# Patient Record
Sex: Female | Born: 1940
Health system: Southern US, Community
[De-identification: ages and names within clinical notes are randomized; demographics above are authoritative.]

## PROBLEM LIST (undated history)

## (undated) DIAGNOSIS — D259 Leiomyoma of uterus, unspecified: Secondary | ICD-10-CM

## (undated) DIAGNOSIS — M1990 Unspecified osteoarthritis, unspecified site: Secondary | ICD-10-CM

## (undated) DIAGNOSIS — I2510 Atherosclerotic heart disease of native coronary artery without angina pectoris: Secondary | ICD-10-CM

## (undated) DIAGNOSIS — D689 Coagulation defect, unspecified: Secondary | ICD-10-CM

## (undated) DIAGNOSIS — H269 Unspecified cataract: Secondary | ICD-10-CM

## (undated) DIAGNOSIS — I10 Essential (primary) hypertension: Secondary | ICD-10-CM

## (undated) HISTORY — PX: CATARACT EXTRACTION, BILATERAL: SHX1313

## (undated) HISTORY — PX: TUBAL LIGATION: SHX77

## (undated) HISTORY — PX: CHOLECYSTECTOMY: SHX55

## (undated) HISTORY — DX: Leiomyoma of uterus, unspecified: D25.9

## (undated) HISTORY — DX: Coagulation defect, unspecified: D68.9

## (undated) HISTORY — DX: Unspecified cataract: H26.9

## (undated) HISTORY — PX: EYE SURGERY: SHX253

## (undated) HISTORY — DX: Unspecified osteoarthritis, unspecified site: M19.90

---

## 2004-07-30 ENCOUNTER — Ambulatory Visit (HOSPITAL_COMMUNITY): Admission: RE | Admit: 2004-07-30 | Discharge: 2004-07-30 | Payer: Self-pay | Admitting: Pulmonary Disease

## 2004-09-27 ENCOUNTER — Observation Stay (HOSPITAL_COMMUNITY): Admission: RE | Admit: 2004-09-27 | Discharge: 2004-09-28 | Payer: Self-pay | Admitting: General Surgery

## 2006-05-02 ENCOUNTER — Ambulatory Visit (HOSPITAL_COMMUNITY): Admission: RE | Admit: 2006-05-02 | Discharge: 2006-05-02 | Payer: Self-pay | Admitting: Pulmonary Disease

## 2010-04-18 ENCOUNTER — Encounter: Payer: Self-pay | Admitting: Pulmonary Disease

## 2010-07-29 ENCOUNTER — Other Ambulatory Visit (HOSPITAL_COMMUNITY): Payer: Self-pay | Admitting: Pulmonary Disease

## 2010-07-29 ENCOUNTER — Ambulatory Visit (HOSPITAL_COMMUNITY)
Admission: RE | Admit: 2010-07-29 | Discharge: 2010-07-29 | Disposition: A | Discharge status: Home / Self Care | Payer: Medicare Other | Source: Ambulatory Visit | Attending: Pulmonary Disease | Admitting: Pulmonary Disease

## 2010-07-29 DIAGNOSIS — R52 Pain, unspecified: Secondary | ICD-10-CM

## 2010-07-29 DIAGNOSIS — M25519 Pain in unspecified shoulder: Secondary | ICD-10-CM | POA: Insufficient documentation

## 2010-07-29 DIAGNOSIS — M25569 Pain in unspecified knee: Secondary | ICD-10-CM | POA: Insufficient documentation

## 2010-07-29 DIAGNOSIS — M7730 Calcaneal spur, unspecified foot: Secondary | ICD-10-CM | POA: Insufficient documentation

## 2010-07-29 DIAGNOSIS — M25579 Pain in unspecified ankle and joints of unspecified foot: Secondary | ICD-10-CM | POA: Insufficient documentation

## 2013-06-24 ENCOUNTER — Encounter (HOSPITAL_COMMUNITY): Payer: Self-pay | Admitting: Emergency Medicine

## 2013-06-24 ENCOUNTER — Emergency Department (HOSPITAL_COMMUNITY)
Admission: EM | Admit: 2013-06-24 | Discharge: 2013-06-24 | Disposition: A | Discharge status: Home / Self Care | Payer: No Typology Code available for payment source | Attending: Emergency Medicine | Admitting: Emergency Medicine

## 2013-06-24 DIAGNOSIS — I10 Essential (primary) hypertension: Secondary | ICD-10-CM | POA: Diagnosis not present

## 2013-06-24 DIAGNOSIS — Z79899 Other long term (current) drug therapy: Secondary | ICD-10-CM | POA: Diagnosis not present

## 2013-06-24 DIAGNOSIS — Y9389 Activity, other specified: Secondary | ICD-10-CM | POA: Diagnosis not present

## 2013-06-24 DIAGNOSIS — Z043 Encounter for examination and observation following other accident: Secondary | ICD-10-CM | POA: Insufficient documentation

## 2013-06-24 DIAGNOSIS — Z7982 Long term (current) use of aspirin: Secondary | ICD-10-CM | POA: Diagnosis not present

## 2013-06-24 DIAGNOSIS — Y92410 Unspecified street and highway as the place of occurrence of the external cause: Secondary | ICD-10-CM | POA: Diagnosis not present

## 2013-06-24 HISTORY — DX: Essential (primary) hypertension: I10
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
## 2013-10-08 ENCOUNTER — Other Ambulatory Visit: Payer: Self-pay | Admitting: Nurse Practitioner

## 2013-10-08 ENCOUNTER — Other Ambulatory Visit: Payer: Self-pay | Admitting: Internal Medicine

## 2013-10-08 DIAGNOSIS — I10 Essential (primary) hypertension: Secondary | ICD-10-CM

## 2013-10-09 ENCOUNTER — Ambulatory Visit
Admission: RE | Admit: 2013-10-09 | Discharge: 2013-10-09 | Disposition: A | Discharge status: Home / Self Care | Payer: Medicare Other | Source: Ambulatory Visit | Attending: Nurse Practitioner | Admitting: Nurse Practitioner

## 2013-10-09 DIAGNOSIS — I10 Essential (primary) hypertension: Secondary | ICD-10-CM

## 2014-12-18 ENCOUNTER — Encounter: Payer: Self-pay | Admitting: Obstetrics

## 2014-12-18 ENCOUNTER — Ambulatory Visit (INDEPENDENT_AMBULATORY_CARE_PROVIDER_SITE_OTHER): Payer: Medicare Other | Admitting: Obstetrics

## 2014-12-18 ENCOUNTER — Other Ambulatory Visit: Payer: Self-pay | Admitting: Obstetrics

## 2014-12-18 ENCOUNTER — Telehealth: Payer: Self-pay

## 2014-12-18 VITALS — BP 162/83 | HR 73 | Temp 97.9°F | Ht 63.0 in | Wt 189.0 lb

## 2014-12-18 DIAGNOSIS — N760 Acute vaginitis: Secondary | ICD-10-CM

## 2014-12-18 DIAGNOSIS — Z01419 Encounter for gynecological examination (general) (routine) without abnormal findings: Secondary | ICD-10-CM

## 2014-12-18 DIAGNOSIS — D251 Intramural leiomyoma of uterus: Secondary | ICD-10-CM

## 2014-12-18 DIAGNOSIS — Z780 Asymptomatic menopausal state: Secondary | ICD-10-CM

## 2014-12-18 DIAGNOSIS — Z1239 Encounter for other screening for malignant neoplasm of breast: Secondary | ICD-10-CM

## 2014-12-18 NOTE — Progress Notes (Signed)
Subjective:        Tina Ware is a 74 y.o. female here for a routine exam.  Current complaints: None.    Personal health questionnaire:  Is patient Tina Ware, have a family history of breast and/or ovarian cancer: no Is there a family history of uterine cancer diagnosed at age < 86, gastrointestinal cancer, urinary tract cancer, family member who is a Field seismologist syndrome-associated carrier: no Is the patient overweight and hypertensive, family history of diabetes, personal history of gestational diabetes, preeclampsia or PCOS: no Is patient over 62, have PCOS,  family history of premature CHD under age 40, diabetes, smoke, have hypertension or peripheral artery disease:  no At any time, has a partner hit, kicked or otherwise hurt or frightened you?: no Over the past 2 weeks, have you felt down, depressed or hopeless?: no Over the past 2 weeks, have you felt little interest or pleasure in doing things?:no   Gynecologic History No LMP recorded. Patient is postmenopausal. Contraception: post menopausal status Last Pap: unknown. Results were: normal Last mammogram: 2015. Results were: normal  Obstetric History OB History  No data available    Past Medical History  Diagnosis Date  . Hypertension   . Arthritis     RA  . Fibroid, uterine     Past Surgical History  Procedure Laterality Date  . Cholecystectomy       Current outpatient prescriptions:  .  aspirin EC 81 MG tablet, Take 81 mg by mouth daily., Disp: , Rfl:  .  carvedilol (COREG) 6.25 MG tablet, Take 6.25 mg by mouth 2 (two) times daily with a meal., Disp: , Rfl:  .  potassium chloride SA (K-DUR,KLOR-CON) 20 MEQ tablet, Take 20 mEq by mouth 2 (two) times daily., Disp: , Rfl:  .  triamterene-hydrochlorothiazide (DYAZIDE) 37.5-25 MG per capsule, Take 1 capsule by mouth daily., Disp: , Rfl:  .  Vitamin D, Ergocalciferol, (DRISDOL) 50000 UNITS CAPS capsule, Take 50,000 Units by mouth 2 (two) times a week.  Tuesday and Friday, Disp: , Rfl:  .  furosemide (LASIX) 20 MG tablet, Take 20 mg by mouth daily., Disp: , Rfl:  No Known Allergies  Social History  Substance Use Topics  . Smoking status: Never Smoker   . Smokeless tobacco: Not on file  . Alcohol Use: No    Family History  Problem Relation Age of Onset  . Heart disease Mother       Review of Systems  Constitutional: negative for fatigue and weight loss Respiratory: negative for cough and wheezing Cardiovascular: negative for chest pain, fatigue and palpitations Gastrointestinal: negative for abdominal pain and change in bowel habits Musculoskeletal:negative for myalgias Neurological: negative for gait problems and tremors Behavioral/Psych: negative for abusive relationship, depression Endocrine: negative for temperature intolerance   Genitourinary:negative for abnormal menstrual periods, genital lesions, hot flashes, sexual problems and vaginal discharge Integument/breast: negative for breast lump, breast tenderness, nipple discharge and skin lesion(s)    Objective:       BP 162/83 mmHg  Pulse 73  Temp(Src) 97.9 F (36.6 C)  Ht 5\' 3"  (1.6 m)  Wt 189 lb (85.73 kg)  BMI 33.49 kg/m2 General:   alert  Skin:   no rash or abnormalities  Lungs:   clear to auscultation bilaterally  Heart:   regular rate and rhythm, S1, S2 normal, no murmur, click, rub or gallop  Breasts:   normal without suspicious masses, skin or nipple changes or axillary nodes  Abdomen:  normal findings:  no organomegaly, soft, non-tender and no hernia  Pelvis:  External genitalia: normal general appearance Urinary system: urethral meatus normal and bladder without fullness, nontender Vaginal: normal without tenderness, induration or masses Cervix: normal appearance Adnexa: normal bimanual exam Uterus: anteverted and non-tender, normal size   Lab Review Urine pregnancy test Labs reviewed yes Radiologic studies reviewed no    Assessment:     Healthy female exam.    Menopausal.  Doing well.  H/O fibroids.  Stable.   Plan:    Education reviewed: calcium supplements and self breast exams. Mammogram ordered. Follow up in: 2 years.   Meds ordered this encounter  Medications  . carvedilol (COREG) 6.25 MG tablet    Sig: Take 6.25 mg by mouth 2 (two) times daily with a meal.   Orders Placed This Encounter  Procedures  . Bacterial Vaginosis DNA  . Candidiasis, PCR  . MM Digital Screening    Please schedule at [facility].    Standing Status: Future     Number of Occurrences:      Standing Expiration Date: 02/17/2016    Order Specific Question:  Reason for Exam (SYMPTOM  OR DIAGNOSIS REQUIRED)    Answer:  screening    Order Specific Question:  Preferred imaging location?    Answer:  External  . DG Bone Density    Standing Status: Future     Number of Occurrences:      Standing Expiration Date: 02/17/2016    Scheduling Instructions:     Please schedule at [facility].    Order Specific Question:  Reason for Exam (SYMPTOM  OR DIAGNOSIS REQUIRED)    Answer:  screening    Order Specific Question:  Preferred imaging location?    Answer:  External

## 2014-12-18 NOTE — Telephone Encounter (Signed)
PATIENT [place] FOR BONE DENSITY SCAN AT [facility] ON 01/08/15 AT 2:15 - HER MAMMOGRAM IS [facility] AFTER THAT - THEY ARE NOT ON EPIC, SO WROTE APPT NOTES ON ORDERS

## 2014-12-19 LAB — PAP IG (IMAGE GUIDED)

## 2014-12-22 LAB — CANDIDIASIS, PCR
C. albicans, DNA: NOT DETECTED
C. glabrata, DNA: NOT DETECTED
C. parapsilosis, DNA: NOT DETECTED
C. tropicalis, DNA: NOT DETECTED

## 2014-12-22 LAB — BACTERIAL VAGINOSIS DNA
Atopobium vaginae: NOT DETECTED Log (cells/mL)
Gardnerella vaginalis: NOT DETECTED Log (cells/mL)
LACTOBACILLUS SPECIES: 7.8 Log (cells/mL)
MEGASPHAERA SPECIES: NOT DETECTED Log (cells/mL)

## 2015-08-21 ENCOUNTER — Emergency Department (HOSPITAL_COMMUNITY)
Admission: EM | Admit: 2015-08-21 | Discharge: 2015-08-22 | Disposition: A | Discharge status: Home / Self Care | Payer: Medicare Other | Attending: Emergency Medicine | Admitting: Emergency Medicine

## 2015-08-21 ENCOUNTER — Encounter (HOSPITAL_COMMUNITY): Payer: Self-pay | Admitting: *Deleted

## 2015-08-21 ENCOUNTER — Emergency Department (HOSPITAL_COMMUNITY): Payer: Medicare Other

## 2015-08-21 DIAGNOSIS — K2970 Gastritis, unspecified, without bleeding: Secondary | ICD-10-CM | POA: Insufficient documentation

## 2015-08-21 DIAGNOSIS — Z7982 Long term (current) use of aspirin: Secondary | ICD-10-CM | POA: Insufficient documentation

## 2015-08-21 DIAGNOSIS — I10 Essential (primary) hypertension: Secondary | ICD-10-CM | POA: Diagnosis not present

## 2015-08-21 DIAGNOSIS — R109 Unspecified abdominal pain: Secondary | ICD-10-CM | POA: Diagnosis present

## 2015-08-21 DIAGNOSIS — I2510 Atherosclerotic heart disease of native coronary artery without angina pectoris: Secondary | ICD-10-CM | POA: Diagnosis not present

## 2015-08-21 DIAGNOSIS — R3129 Other microscopic hematuria: Secondary | ICD-10-CM | POA: Diagnosis not present

## 2015-08-21 DIAGNOSIS — E871 Hypo-osmolality and hyponatremia: Secondary | ICD-10-CM

## 2015-08-21 HISTORY — DX: Atherosclerotic heart disease of native coronary artery without angina pectoris: I25.10

## 2015-08-21 LAB — COMPREHENSIVE METABOLIC PANEL
ALT: 17 U/L (ref 14–54)
AST: 24 U/L (ref 15–41)
Albumin: 3.7 g/dL (ref 3.5–5.0)
Alkaline Phosphatase: 68 U/L (ref 38–126)
Anion gap: 8 (ref 5–15)
BUN: 8 mg/dL (ref 6–20)
CO2: 27 mmol/L (ref 22–32)
Calcium: 9.1 mg/dL (ref 8.9–10.3)
Chloride: 92 mmol/L — ABNORMAL LOW (ref 101–111)
Creatinine, Ser: 0.73 mg/dL (ref 0.44–1.00)
GFR calc Af Amer: >60 mL/min (ref >60)
GFR calc non Af Amer: >60 mL/min (ref >60)
Glucose, Bld: 154 mg/dL — ABNORMAL HIGH (ref 65–99)
Potassium: 3.9 mmol/L (ref 3.5–5.1)
Sodium: 127 mmol/L — ABNORMAL LOW (ref 135–145)
Total Bilirubin: 1.4 mg/dL — ABNORMAL HIGH (ref 0.3–1.2)
Total Protein: 6.8 g/dL (ref 6.5–8.1)

## 2015-08-21 LAB — CBC
HCT: 47.2 % — ABNORMAL HIGH (ref 36.0–46.0)
Hemoglobin: 17 g/dL — ABNORMAL HIGH (ref 12.0–15.0)
MCH: 29.7 pg (ref 26.0–34.0)
MCHC: 36 g/dL (ref 30.0–36.0)
MCV: 82.5 fL (ref 78.0–100.0)
Platelets: 185 10*3/uL (ref 150–400)
RBC: 5.72 MIL/uL — ABNORMAL HIGH (ref 3.87–5.11)
RDW: 13 % (ref 11.5–15.5)
WBC: 8.2 10*3/uL (ref 4.0–10.5)

## 2015-08-21 LAB — URINE MICROSCOPIC-ADD ON

## 2015-08-21 LAB — URINALYSIS, ROUTINE W REFLEX MICROSCOPIC
Bilirubin Urine: NEGATIVE
Glucose, UA: NEGATIVE mg/dL
Ketones, ur: >80 mg/dL — AB
Leukocytes, UA: NEGATIVE
Nitrite: NEGATIVE
Protein, ur: 100 mg/dL — AB
Specific Gravity, Urine: 1.02 (ref 1.005–1.030)
pH: 7.5 (ref 5.0–8.0)

## 2015-08-21 LAB — LIPASE, BLOOD: Lipase: 20 U/L (ref 11–51)

## 2015-08-21 MED ORDER — ONDANSETRON HCL 4 MG/2ML IJ SOLN: 4.0000 mg | Freq: Once | INTRAMUSCULAR | Status: DC | PRN

## 2015-08-21 MED ORDER — ONDANSETRON HCL 4 MG/2ML IJ SOLN
4.0000 mg | Freq: Once | INTRAMUSCULAR | Status: AC
  Administered 2015-08-21: 4 mg via INTRAVENOUS
  Filled 2015-08-21: qty 2

## 2015-08-21 MED ORDER — IOPAMIDOL (ISOVUE-300) INJECTION 61%
100.0000 mL | Freq: Once | INTRAVENOUS | Status: AC | PRN
  Administered 2015-08-21: 100 mL via INTRAVENOUS

## 2015-08-21 MED ORDER — SODIUM CHLORIDE 0.9 % IV BOLUS (SEPSIS)
500.0000 mL | Freq: Once | INTRAVENOUS | Status: AC
Start: 2015-08-21 — End: 2015-08-22
  Administered 2015-08-22: 500 mL via INTRAVENOUS

## 2015-08-21 MED ORDER — PANTOPRAZOLE SODIUM 40 MG IV SOLR
40.0000 mg | Freq: Once | INTRAVENOUS | Status: AC
  Administered 2015-08-21: 40 mg via INTRAVENOUS
  Filled 2015-08-21: qty 40

## 2015-08-21 NOTE — ED Notes (Signed)
Patient ambulatory to restroom for urine sample.

## 2015-08-21 NOTE — ED Notes (Signed)
Pt c/o abd pain / burning; pt states that it began around noon; pt reports 1 episode of vomiting and 1 episode of diarrhea

## 2015-08-21 NOTE — ED Provider Notes (Signed)
CSN: [id number]     Arrival date & time 08/21/15  2132 History   First MD Initiated Contact with Patient 08/21/15 2158     Chief Complaint  Patient presents with  . Abdominal Pain     (Consider location/radiation/quality/duration/timing/severity/associated sxs/prior Treatment) HPI Comments: Patient with a history of hypertension and coronary artery disease presents with abdominal pain. She states it started around noon today and is been ongoing since that time. She describes as a burning pain in the center of her abdomen. She's had some associated nausea and vomiting as well as occasional loose stools. She states she hasn't been able to eat anything because she feels like she has to vomit every time she tries to eat. She is status post cholecystectomy. She denies any fevers. No urinary symptoms. No past history of similar discomfort. She is not taking anything today for the pain.  Patient is a 75 y.o. female presenting with abdominal pain.  Abdominal Pain Associated symptoms: diarrhea, nausea and vomiting   Associated symptoms: no chest pain, no chills, no cough, no fatigue, no fever, no hematuria and no shortness of breath     Past Medical History  Diagnosis Date  . Hypertension   . Arthritis     RA  . Fibroid, uterine   . CAD (coronary artery disease)    Past Surgical History  Procedure Laterality Date  . Cholecystectomy     Family History  Problem Relation Age of Onset  . Heart disease Mother    Social History  Substance Use Topics  . Smoking status: Never Smoker   . Smokeless tobacco: None  . Alcohol Use: No   OB History    No data available     Review of Systems  Constitutional: Negative for fever, chills, diaphoresis and fatigue.  HENT: Negative for congestion, rhinorrhea and sneezing.   Eyes: Negative.   Respiratory: Negative for cough, chest tightness and shortness of breath.   Cardiovascular: Negative for chest pain and leg swelling.  Gastrointestinal:  Positive for nausea, vomiting, abdominal pain and diarrhea. Negative for blood in stool.  Genitourinary: Negative for frequency, hematuria, flank pain and difficulty urinating.  Musculoskeletal: Negative for back pain and arthralgias.  Skin: Negative for rash.  Neurological: Negative for dizziness, speech difficulty, weakness, numbness and headaches.      Allergies  Review of patient's allergies indicates no known allergies.  Home Medications   Prior to Admission medications   Medication Sig Start Date End Date Taking? Authorizing Provider  aspirin EC 81 MG tablet Take 81 mg by mouth daily.   Yes Historical Provider, MD  benzonatate (TESSALON) 100 MG capsule Take 100 mg by mouth 3 (three) times daily as needed for cough.   Yes Historical Provider, MD  carvedilol (COREG) 6.25 MG tablet Take 6.25 mg by mouth 2 (two) times daily with a meal.   Yes Historical Provider, MD  cholecalciferol (VITAMIN D) 1000 units tablet Take 1,000 Units by mouth daily.   Yes Historical Provider, MD  furosemide (LASIX) 20 MG tablet Take 20 mg by mouth daily as needed for fluid.    Yes Historical Provider, MD  hydrocortisone (ANUSOL-HC) 25 MG suppository Place 25 mg rectally 2 (two) times daily as needed for hemorrhoids or itching.   Yes Historical Provider, MD  mometasone (NASONEX) 50 MCG/ACT nasal spray Place 2 sprays into the nose daily as needed (allergies).   Yes Historical Provider, MD  Multiple Vitamin (MULTIVITAMIN WITH MINERALS) TABS tablet Take 1 tablet by mouth daily.  Yes Historical Provider, MD  potassium chloride SA (K-DUR,KLOR-CON) 20 MEQ tablet Take 20 mEq by mouth 2 (two) times daily.   Yes Historical Provider, MD  triamterene-hydrochlorothiazide (DYAZIDE) 37.5-25 MG per capsule Take 1 capsule by mouth daily.   Yes Historical Provider, MD  omeprazole (PRILOSEC) 20 MG capsule Take 1 capsule (20 mg total) by mouth daily. 08/22/15   Malvin Johns, MD   BP 171/84 mmHg  Pulse 88  Temp(Src) 98.2 F  (36.8 C) (Oral)  Resp 20  SpO2 96% Physical Exam  Constitutional: She is oriented to person, place, and time. She appears well-developed and well-nourished.  HENT:  Head: Normocephalic and atraumatic.  Eyes: Pupils are equal, round, and reactive to light.  Neck: Normal range of motion. Neck supple.  Cardiovascular: Normal rate, regular rhythm and normal heart sounds.   Pulmonary/Chest: Effort normal and breath sounds normal. No respiratory distress. She has no wheezes. She has no rales. She exhibits no tenderness.  Abdominal: Soft. Bowel sounds are normal. There is tenderness (Moderate tenderness to the mid abdomen and across the upper abdomen bilaterally). There is no rebound and no guarding.  Musculoskeletal: Normal range of motion. She exhibits no edema.  Lymphadenopathy:    She has no cervical adenopathy.  Neurological: She is alert and oriented to person, place, and time.  Skin: Skin is warm and dry. No rash noted.  Psychiatric: She has a normal mood and affect.    ED Course  Procedures (including critical care time) Labs Review Labs Reviewed  COMPREHENSIVE METABOLIC PANEL - Abnormal; Notable for the following:    Sodium 127 (*)    Chloride 92 (*)    Glucose, Bld 154 (*)    Total Bilirubin 1.4 (*)    All other components within normal limits  CBC - Abnormal; Notable for the following:    RBC 5.72 (*)    Hemoglobin 17.0 (*)    HCT 47.2 (*)    All other components within normal limits  URINALYSIS, ROUTINE W REFLEX MICROSCOPIC (NOT AT [facility]) - Abnormal; Notable for the following:    APPearance CLOUDY (*)    Hgb urine dipstick SMALL (*)    Ketones, ur >80 (*)    Protein, ur 100 (*)    All other components within normal limits  URINE MICROSCOPIC-ADD ON - Abnormal; Notable for the following:    Squamous Epithelial / LPF 6-30 (*)    Bacteria, UA FEW (*)    All other components within normal limits  LIPASE, BLOOD    Imaging Review Ct Abdomen Pelvis W  Contrast  08/22/2015  CLINICAL DATA:  Acute onset of generalized abdominal pain and burning. Vomiting and diarrhea. Initial encounter. EXAM: CT ABDOMEN AND PELVIS WITH CONTRAST TECHNIQUE: Multidetector CT imaging of the abdomen and pelvis was performed using the standard protocol following bolus administration of intravenous contrast. CONTRAST:  119mL ISOVUE-300 IOPAMIDOL (ISOVUE-300) INJECTION 61% COMPARISON:  Abdominal ultrasound performed 07/30/2004 FINDINGS: The visualized lung bases are clear. The liver and spleen are unremarkable in appearance. The patient is status post cholecystectomy, with clips noted at the gallbladder fossa. The pancreas and adrenal glands are unremarkable. Minimal right-sided renal scarring is noted. Scattered small left renal cysts are seen. There is no evidence of hydronephrosis. No renal or ureteral stones are seen. No perinephric stranding is appreciated. No free fluid is identified. The small bowel is unremarkable in appearance. The stomach is within normal limits. No acute vascular abnormalities are seen. The appendix is normal in caliber, without evidence  of appendicitis. Minimal diverticulosis is noted along the hepatic flexure of the colon, and along the descending and proximal sigmoid colon, without evidence of diverticulitis. The bladder is mildly distended and grossly unremarkable in appearance. Multiple uterine fibroids are seen, measuring up to 6.5 cm. No suspicious adnexal masses are seen. The ovaries are relatively symmetric. No inguinal lymphadenopathy is seen. No acute osseous abnormalities are identified. Vacuum phenomenon is noted at L4-L5. IMPRESSION: 1. No acute abnormality seen to explain the patient's symptoms. 2. Multiple large uterine fibroids seen, measuring up to 6.5 cm. 3. Minimal diverticulosis along the hepatic flexure of the colon, and along the descending and proximal sigmoid colon, without evidence of diverticulitis. 4. Small left renal cysts seen.  Minimal right-sided renal scarring noted. Electronically Signed   By: Garald Balding M.D.   On: 08/22/2015 00:11   I have personally reviewed and evaluated these images and lab results as part of my medical decision-making.   EKG Interpretation None      MDM   Final diagnoses:  Gastritis  Hyponatremia  Microscopic hematuria    Pt's CT nonconcerning.  Her lipase is normal without evidence of pancreatitis. She is status post cholecystectomy. I feel her symptoms are likely related to gastritis. She was given Protonix in the ED and discharged with a prescription for Prilosec. She was encouraged to use a bland diet and follow-up with her PCP. She also was advised that her sodium was low and needs to be rechecked within the next week. I also advised her that she had microscopic hematuria that needs to be rechecked by her PCP. Return precautions were given.    Malvin Johns, MD 08/22/15 [id number]

## 2015-08-22 MED ORDER — GI COCKTAIL ~~LOC~~
30.0000 mL | Freq: Once | ORAL | Status: AC
  Administered 2015-08-22: 30 mL via ORAL
  Filled 2015-08-22: qty 30

## 2015-08-22 MED ORDER — OMEPRAZOLE 20 MG PO CPDR: 20.0000 mg | DELAYED_RELEASE_CAPSULE | Freq: Every day | ORAL | Status: DC

## 2015-08-22 NOTE — ED Notes (Signed)
Patient d/c'd self care.  F/U and medications reviewed.  Patient verbalized understanding. 

## 2015-08-22 NOTE — Discharge Instructions (Signed)
Your sodium level is low (129).  This needs to be rechecked by your primary care provider within the next week.  You also need to have your urine rechecked as there was blood in it.  Gastritis, Adult Gastritis is soreness and swelling (inflammation) of the lining of the stomach. Gastritis can develop as a sudden onset (acute) or long-term (chronic) condition. If gastritis is not treated, it can lead to stomach bleeding and ulcers. CAUSES  Gastritis occurs when the stomach lining is weak or damaged. Digestive juices from the stomach then inflame the weakened stomach lining. The stomach lining may be weak or damaged due to viral or bacterial infections. One common bacterial infection is the Helicobacter pylori infection. Gastritis can also result from excessive alcohol consumption, taking certain medicines, or having too much acid in the stomach.  SYMPTOMS  In some cases, there are no symptoms. When symptoms are present, they may include:  Pain or a burning sensation in the upper abdomen.  Nausea.  Vomiting.  An uncomfortable feeling of fullness after eating. DIAGNOSIS  Your caregiver may suspect you have gastritis based on your symptoms and a physical exam. To determine the cause of your gastritis, your caregiver may perform the following:  Blood or stool tests to check for the H pylori bacterium.  Gastroscopy. A thin, flexible tube (endoscope) is passed down the esophagus and into the stomach. The endoscope has a light and camera on the end. Your caregiver uses the endoscope to view the inside of the stomach.  Taking a tissue sample (biopsy) from the stomach to examine under a microscope. TREATMENT  Depending on the cause of your gastritis, medicines may be prescribed. If you have a bacterial infection, such as an H pylori infection, antibiotics may be given. If your gastritis is caused by too much acid in the stomach, H2 blockers or antacids may be given. Your caregiver may recommend that  you stop taking aspirin, ibuprofen, or other nonsteroidal anti-inflammatory drugs (NSAIDs). HOME CARE INSTRUCTIONS  Only take over-the-counter or prescription medicines as directed by your caregiver.  If you were given antibiotic medicines, take them as directed. Finish them even if you start to feel better.  Drink enough fluids to keep your urine clear or pale yellow.  Avoid foods and drinks that make your symptoms worse, such as:  Caffeine or alcoholic drinks.  Chocolate.  Peppermint or mint flavorings.  Garlic and onions.  Spicy foods.  Citrus fruits, such as oranges, lemons, or limes.  Tomato-based foods such as sauce, chili, salsa, and pizza.  Fried and fatty foods.  Eat small, frequent meals instead of large meals. SEEK IMMEDIATE MEDICAL CARE IF:   You have black or dark red stools.  You vomit blood or material that looks like coffee grounds.  You are unable to keep fluids down.  Your abdominal pain gets worse.  You have a fever.  You do not feel better after 1 week.  You have any other questions or concerns. MAKE SURE YOU:  Understand these instructions.  Will watch your condition.  Will get help right away if you are not doing well or get worse.   This information is not intended to replace advice given to you by your health care provider. Make sure you discuss any questions you have with your health care provider.   Document Released: 03/08/2001 Document Revised: 09/13/2011 Document Reviewed: 04/27/2011 Elsevier Interactive Patient Education [employer].

## 2015-12-23 ENCOUNTER — Other Ambulatory Visit: Payer: Self-pay | Admitting: Nurse Practitioner

## 2015-12-23 ENCOUNTER — Ambulatory Visit
Admission: RE | Admit: 2015-12-23 | Discharge: 2015-12-23 | Disposition: A | Discharge status: Home / Self Care | Payer: Medicare Other | Source: Ambulatory Visit | Attending: Nurse Practitioner | Admitting: Nurse Practitioner

## 2015-12-23 DIAGNOSIS — M25561 Pain in right knee: Secondary | ICD-10-CM

## 2016-04-07 ENCOUNTER — Encounter: Payer: Self-pay | Admitting: *Deleted

## 2017-03-27 ENCOUNTER — Encounter: Payer: Self-pay | Admitting: *Deleted

## 2017-04-28 ENCOUNTER — Inpatient Hospital Stay (HOSPITAL_COMMUNITY)
Admission: EM | Admit: 2017-04-28 | Discharge: 2017-05-04 | DRG: 641 | Disposition: A | Discharge status: Home / Self Care | Payer: Medicare Other | Attending: Internal Medicine | Admitting: Internal Medicine

## 2017-04-28 ENCOUNTER — Encounter (HOSPITAL_COMMUNITY): Payer: Self-pay

## 2017-04-28 DIAGNOSIS — E871 Hypo-osmolality and hyponatremia: Secondary | ICD-10-CM

## 2017-04-28 DIAGNOSIS — E860 Dehydration: Principal | ICD-10-CM | POA: Diagnosis present

## 2017-04-28 DIAGNOSIS — E878 Other disorders of electrolyte and fluid balance, not elsewhere classified: Secondary | ICD-10-CM | POA: Diagnosis present

## 2017-04-28 DIAGNOSIS — I2510 Atherosclerotic heart disease of native coronary artery without angina pectoris: Secondary | ICD-10-CM | POA: Diagnosis present

## 2017-04-28 DIAGNOSIS — Z8249 Family history of ischemic heart disease and other diseases of the circulatory system: Secondary | ICD-10-CM

## 2017-04-28 DIAGNOSIS — Z7982 Long term (current) use of aspirin: Secondary | ICD-10-CM

## 2017-04-28 DIAGNOSIS — K219 Gastro-esophageal reflux disease without esophagitis: Secondary | ICD-10-CM | POA: Diagnosis present

## 2017-04-28 DIAGNOSIS — R600 Localized edema: Secondary | ICD-10-CM | POA: Diagnosis present

## 2017-04-28 DIAGNOSIS — R1084 Generalized abdominal pain: Secondary | ICD-10-CM | POA: Diagnosis present

## 2017-04-28 DIAGNOSIS — R059 Cough, unspecified: Secondary | ICD-10-CM

## 2017-04-28 DIAGNOSIS — R197 Diarrhea, unspecified: Secondary | ICD-10-CM

## 2017-04-28 DIAGNOSIS — E861 Hypovolemia: Secondary | ICD-10-CM | POA: Diagnosis present

## 2017-04-28 DIAGNOSIS — R109 Unspecified abdominal pain: Secondary | ICD-10-CM

## 2017-04-28 DIAGNOSIS — I10 Essential (primary) hypertension: Secondary | ICD-10-CM | POA: Diagnosis present

## 2017-04-28 DIAGNOSIS — R05 Cough: Secondary | ICD-10-CM

## 2017-04-28 DIAGNOSIS — K3184 Gastroparesis: Secondary | ICD-10-CM | POA: Diagnosis present

## 2017-04-28 NOTE — ED Triage Notes (Signed)
Pt complains of lower abd pain since this am, she's had frequent BM's but not loose, this afternoon she started vomiting

## 2017-04-29 ENCOUNTER — Emergency Department (HOSPITAL_COMMUNITY): Payer: Medicare Other

## 2017-04-29 ENCOUNTER — Encounter (HOSPITAL_COMMUNITY): Payer: Self-pay | Admitting: Radiology

## 2017-04-29 ENCOUNTER — Observation Stay (HOSPITAL_COMMUNITY): Payer: Medicare Other

## 2017-04-29 ENCOUNTER — Other Ambulatory Visit: Payer: Self-pay

## 2017-04-29 DIAGNOSIS — R1084 Generalized abdominal pain: Secondary | ICD-10-CM | POA: Diagnosis not present

## 2017-04-29 DIAGNOSIS — E860 Dehydration: Secondary | ICD-10-CM | POA: Diagnosis present

## 2017-04-29 DIAGNOSIS — E871 Hypo-osmolality and hyponatremia: Secondary | ICD-10-CM | POA: Diagnosis not present

## 2017-04-29 LAB — COMPREHENSIVE METABOLIC PANEL
ALT: 17 U/L (ref 14–54)
AST: 29 U/L (ref 15–41)
Albumin: 3.8 g/dL (ref 3.5–5.0)
Alkaline Phosphatase: 67 U/L (ref 38–126)
Anion gap: 10 (ref 5–15)
BUN: 6 mg/dL (ref 6–20)
CO2: 26 mmol/L (ref 22–32)
Calcium: 9.1 mg/dL (ref 8.9–10.3)
Chloride: 89 mmol/L — ABNORMAL LOW (ref 101–111)
Creatinine, Ser: 0.67 mg/dL (ref 0.44–1.00)
GFR calc Af Amer: >60 mL/min (ref >60)
GFR calc non Af Amer: >60 mL/min (ref >60)
Glucose, Bld: 122 mg/dL — ABNORMAL HIGH (ref 65–99)
Potassium: 3.6 mmol/L (ref 3.5–5.1)
Sodium: 125 mmol/L — ABNORMAL LOW (ref 135–145)
Total Bilirubin: 0.9 mg/dL (ref 0.3–1.2)
Total Protein: 7.2 g/dL (ref 6.5–8.1)

## 2017-04-29 LAB — URINALYSIS, ROUTINE W REFLEX MICROSCOPIC
Bilirubin Urine: NEGATIVE
Glucose, UA: NEGATIVE mg/dL
Hgb urine dipstick: NEGATIVE
Ketones, ur: 80 mg/dL — AB
Leukocytes, UA: NEGATIVE
Nitrite: NEGATIVE
Protein, ur: 30 mg/dL — AB
Specific Gravity, Urine: 1.016 (ref 1.005–1.030)
pH: 7 (ref 5.0–8.0)

## 2017-04-29 LAB — CBC
HCT: 47.3 % — ABNORMAL HIGH (ref 36.0–46.0)
Hemoglobin: 16.9 g/dL — ABNORMAL HIGH (ref 12.0–15.0)
MCH: 29.4 pg (ref 26.0–34.0)
MCHC: 35.7 g/dL (ref 30.0–36.0)
MCV: 82.4 fL (ref 78.0–100.0)
Platelets: 147 10*3/uL — ABNORMAL LOW (ref 150–400)
RBC: 5.74 MIL/uL — ABNORMAL HIGH (ref 3.87–5.11)
RDW: 12.8 % (ref 11.5–15.5)
WBC: 4.1 10*3/uL (ref 4.0–10.5)

## 2017-04-29 LAB — I-STAT CG4 LACTIC ACID, ED: Lactic Acid, Venous: 0.83 mmol/L (ref 0.5–1.9)

## 2017-04-29 LAB — I-STAT TROPONIN, ED: Troponin i, poc: 0.01 ng/mL (ref 0.00–0.08)

## 2017-04-29 LAB — LIPASE, BLOOD: Lipase: 25 U/L (ref 11–51)

## 2017-04-29 MED ORDER — IOPAMIDOL (ISOVUE-300) INJECTION 61%
100.0000 mL | Freq: Once | INTRAVENOUS | Status: AC | PRN
  Administered 2017-04-29: 100 mL via INTRAVENOUS

## 2017-04-29 MED ORDER — HYDRALAZINE HCL 20 MG/ML IJ SOLN
10.0000 mg | INTRAMUSCULAR | Status: AC
  Administered 2017-04-29: 10 mg via INTRAVENOUS
  Filled 2017-04-29: qty 1

## 2017-04-29 MED ORDER — ACETAMINOPHEN 160 MG/5ML PO SOLN
650.0000 mg | Freq: Four times a day (QID) | ORAL | Status: DC | PRN
  Administered 2017-04-29: 650 mg via ORAL
  Filled 2017-04-29: qty 20.3

## 2017-04-29 MED ORDER — ONDANSETRON HCL 4 MG/2ML IJ SOLN
4.0000 mg | Freq: Once | INTRAMUSCULAR | Status: AC
  Administered 2017-04-29: 4 mg via INTRAVENOUS
  Filled 2017-04-29: qty 2

## 2017-04-29 MED ORDER — CARVEDILOL 6.25 MG PO TABS
6.2500 mg | ORAL_TABLET | Freq: Every day | ORAL | Status: DC
  Administered 2017-04-29 – 2017-05-03 (×5): 6.25 mg via ORAL
  Filled 2017-04-29 (×5): qty 1

## 2017-04-29 MED ORDER — IOPAMIDOL (ISOVUE-300) INJECTION 61%
INTRAVENOUS | Status: AC
  Filled 2017-04-29: qty 100

## 2017-04-29 MED ORDER — OMEGA-3 1000 MG PO CAPS: 1000.0000 mg | ORAL_CAPSULE | Freq: Every day | ORAL | Status: DC

## 2017-04-29 MED ORDER — HYDRALAZINE HCL 20 MG/ML IJ SOLN
10.0000 mg | Freq: Three times a day (TID) | INTRAMUSCULAR | Status: DC | PRN
  Administered 2017-04-29 – 2017-05-04 (×7): 10 mg via INTRAVENOUS
  Filled 2017-04-29 (×5): qty 1

## 2017-04-29 MED ORDER — FLUTICASONE PROPIONATE 50 MCG/ACT NA SUSP
1.0000 | Freq: Every day | NASAL | Status: DC
  Administered 2017-04-29 – 2017-05-04 (×6): 1 via NASAL
  Filled 2017-04-29: qty 16

## 2017-04-29 MED ORDER — ADULT MULTIVITAMIN W/MINERALS CH
1.0000 | ORAL_TABLET | Freq: Every day | ORAL | Status: DC
  Administered 2017-04-29 – 2017-05-04 (×5): 1 via ORAL
  Filled 2017-04-29 (×6): qty 1

## 2017-04-29 MED ORDER — MORPHINE SULFATE (PF) 4 MG/ML IV SOLN
4.0000 mg | Freq: Once | INTRAVENOUS | Status: AC
  Administered 2017-04-29: 4 mg via INTRAVENOUS
  Filled 2017-04-29: qty 1

## 2017-04-29 MED ORDER — ENOXAPARIN SODIUM 40 MG/0.4ML ~~LOC~~ SOLN
40.0000 mg | SUBCUTANEOUS | Status: DC
  Administered 2017-04-29 – 2017-05-01 (×3): 40 mg via SUBCUTANEOUS
  Filled 2017-04-29 (×3): qty 0.4

## 2017-04-29 MED ORDER — BENZONATATE 100 MG PO CAPS
100.0000 mg | ORAL_CAPSULE | Freq: Three times a day (TID) | ORAL | Status: DC | PRN
  Administered 2017-05-03: 100 mg via ORAL
  Filled 2017-04-29: qty 1

## 2017-04-29 MED ORDER — VITAMIN D3 25 MCG (1000 UNIT) PO TABS
1000.0000 [IU] | ORAL_TABLET | Freq: Every day | ORAL | Status: DC
  Administered 2017-05-01 – 2017-05-04 (×4): 1000 [IU] via ORAL
  Filled 2017-04-29 (×5): qty 1

## 2017-04-29 MED ORDER — SODIUM CHLORIDE 0.9 % IV SOLN
INTRAVENOUS | Status: AC
  Administered 2017-04-29 – 2017-04-30 (×3): via INTRAVENOUS

## 2017-04-29 MED ORDER — ASPIRIN EC 81 MG PO TBEC
81.0000 mg | DELAYED_RELEASE_TABLET | Freq: Every day | ORAL | Status: DC
  Administered 2017-04-29 – 2017-05-04 (×6): 81 mg via ORAL
  Filled 2017-04-29 (×6): qty 1

## 2017-04-29 MED ORDER — POTASSIUM CHLORIDE CRYS ER 20 MEQ PO TBCR
20.0000 meq | EXTENDED_RELEASE_TABLET | Freq: Two times a day (BID) | ORAL | Status: DC
  Administered 2017-04-29 – 2017-05-04 (×11): 20 meq via ORAL
  Filled 2017-04-29 (×12): qty 1

## 2017-04-29 MED ORDER — OMEGA-3-ACID ETHYL ESTERS 1 G PO CAPS
1.0000 g | ORAL_CAPSULE | Freq: Two times a day (BID) | ORAL | Status: DC
  Administered 2017-05-01 – 2017-05-04 (×6): 1 g via ORAL
  Filled 2017-04-29 (×11): qty 1

## 2017-04-29 NOTE — ED Provider Notes (Signed)
[facility] DEPT Provider Note   CSN: [id number] Arrival date & time: 04/28/17  2230     History   Chief Complaint Chief Complaint  Patient presents with  . Abdominal Pain    HPI Tina Ware is a 77 y.o. female with a hx of HTN, CAD, arthritis, uterine fibroid presents to the Emergency Department complaining of gradual, persistent, progressively worsening central and lower abd pain described as cramping onset 8am. Pt rates her pain at a 6/10. Abd surgical hx consists of cholecystectomy.  Associated symptoms include 1 episode of NBNB emesis and 3-4 episodes of loose stool.  Nothing makes it better and eating makes it worse.  Pt denies fever, chills, headache, neck pain, chest pain, SOB, weakness, dizziness, syncope.  Pt reports her last oral intake was breakfast as she has had no appetite.       The history is provided by the patient and medical records. No language interpreter was used.    Past Medical History:  Diagnosis Date  . Arthritis    RA  . CAD (coronary artery disease)   . Fibroid, uterine   . Hypertension     There are no active problems to display for this patient.   Past Surgical History:  Procedure Laterality Date  . CHOLECYSTECTOMY      OB History    No data available       Home Medications    Prior to Admission medications   Medication Sig Start Date End Date Taking? Authorizing Provider  aspirin EC 81 MG tablet Take 81 mg by mouth daily.   Yes [provider]  benzonatate (TESSALON) 100 MG capsule Take 100 mg by mouth 3 (three) times daily as needed for cough.   Yes [provider]  carvedilol (COREG) 6.25 MG tablet Take 6.25 mg by mouth at bedtime.    Yes [provider]  cholecalciferol (VITAMIN D) 1000 units tablet Take 1,000 Units by mouth daily.   Yes [provider]  furosemide (LASIX) 20 MG tablet Take 20 mg by mouth daily as needed for fluid.    Yes [provider]  hydrocortisone (ANUSOL-HC) 25 MG suppository Place 25 mg rectally 2 (two) times daily as needed for hemorrhoids or itching.   Yes [provider]  mometasone (NASONEX) 50 MCG/ACT nasal spray Place 2 sprays into the nose daily as needed (allergies).   Yes [provider]  Multiple Vitamin (MULTIVITAMIN WITH MINERALS) TABS tablet Take 1 tablet by mouth daily.   Yes [provider]  Omega-3 1000 MG CAPS Take 1,000 mg by mouth daily.   Yes [provider]  potassium chloride SA (K-DUR,KLOR-CON) 20 MEQ tablet Take 20 mEq by mouth 2 (two) times daily.   Yes [provider]  triamterene-hydrochlorothiazide (DYAZIDE) 37.5-25 MG per capsule Take 1 capsule by mouth daily.   Yes [provider]  omeprazole (PRILOSEC) 20 MG capsule Take 1 capsule (20 mg total) by mouth daily. Patient not taking: Reported on 04/28/2017 08/22/15   Malvin Johns, MD    Family History Family History  Problem Relation Age of Onset  . Heart disease Mother     Social History Social History   Tobacco Use  . Smoking status: Never Smoker  . Smokeless tobacco: Never Used  Substance Use Topics  . Alcohol use: No    Alcohol/week: 0.0 oz  . Drug use: No     Allergies   Patient has no known allergies.  Review of Systems Review of Systems  Constitutional: Negative for appetite change, diaphoresis, fatigue, fever and unexpected weight change.  HENT: Negative for mouth sores.   Eyes: Negative for visual disturbance.  Respiratory: Negative for cough, chest tightness, shortness of breath and wheezing.   Cardiovascular: Negative for chest pain.  Gastrointestinal: Positive for abdominal pain, diarrhea, nausea and vomiting. Negative for constipation.  Endocrine: Negative for polydipsia, polyphagia and polyuria.  Genitourinary: Negative for dysuria, frequency, hematuria and urgency.  Musculoskeletal: Negative for back pain and neck stiffness.  Skin:  Negative for rash.  Allergic/Immunologic: Negative for immunocompromised state.  Neurological: Negative for syncope, light-headedness and headaches.  Hematological: Does not bruise/bleed easily.  Psychiatric/Behavioral: Negative for sleep disturbance. The patient is not nervous/anxious.      Physical Exam Updated Vital Signs BP (!) 199/102   Pulse 85   Temp 98.1 F (36.7 C) (Oral)   Resp 14   Ht 5\' 3"  (1.6 m)   Wt 82.6 kg (182 lb)   SpO2 99%   BMI 32.24 kg/m   Physical Exam  Constitutional: She appears well-developed and well-nourished. No distress.  Awake, alert, nontoxic appearance  HENT:  Head: Normocephalic and atraumatic.  Mouth/Throat: Oropharynx is clear and moist. Mucous membranes are dry. No oropharyngeal exudate.  Eyes: Conjunctivae are normal. No scleral icterus.  Neck: Normal range of motion. Neck supple.  Cardiovascular: Normal rate, regular rhythm and intact distal pulses.  Pulmonary/Chest: Effort normal and breath sounds normal. No respiratory distress. She has no wheezes.  Equal chest expansion  Abdominal: Soft. Bowel sounds are normal. She exhibits no mass. There is generalized tenderness. There is no rigidity, no rebound, no guarding, no CVA tenderness, no tenderness at McBurney's point and negative Murphy's sign.  Musculoskeletal: Normal range of motion. She exhibits no edema.  Neurological: She is alert.  Speech is clear and goal oriented Moves extremities without ataxia  Skin: Skin is warm and dry. She is not diaphoretic.  Psychiatric: She has a normal mood and affect.  Nursing note and vitals reviewed.    ED Treatments / Results  Labs (all labs ordered are listed, but only abnormal results are displayed) Labs Reviewed  COMPREHENSIVE METABOLIC PANEL - Abnormal; Notable for the following components:      Result Value   Sodium 125 (*)    Chloride 89 (*)    Glucose, Bld 122 (*)    All other components within normal limits  CBC - Abnormal; Notable  for the following components:   RBC 5.74 (*)    Hemoglobin 16.9 (*)    HCT 47.3 (*)    Platelets 147 (*)    All other components within normal limits  URINALYSIS, ROUTINE W REFLEX MICROSCOPIC - Abnormal; Notable for the following components:   Ketones, ur 80 (*)    Protein, ur 30 (*)    Bacteria, UA RARE (*)    Squamous Epithelial / LPF 0-5 (*)    All other components within normal limits  LIPASE, BLOOD  I-STAT TROPONIN, ED  I-STAT CG4 LACTIC ACID, ED    EKG  EKG Interpretation  Date/Time:  Saturday April 29 2017 01:00:47 EST Ventricular Rate:  95 PR Interval:    QRS Duration: 91 QT Interval:  346 QTC Calculation: 435 R Axis:   -36 Text Interpretation:  Sinus rhythm Left axis deviation Confirmed by Randal Buba, April ([id number]) on 04/29/2017 1:50:18 AM       Radiology Ct Abdomen Pelvis W Contrast  Result Date: 04/29/2017 CLINICAL DATA:  Abdominal  distention, nausea and vomiting. Lower abdominal pain. EXAM: CT ABDOMEN AND PELVIS WITH CONTRAST TECHNIQUE: Multidetector CT imaging of the abdomen and pelvis was performed using the standard protocol following bolus administration of intravenous contrast. CONTRAST:  100 mL Isovue-300 COMPARISON:  08/21/2015 FINDINGS: Lower chest: Mild dependent atelectasis in the lung bases. Small esophageal hiatal hernia. Hepatobiliary: Surgical absence of the gallbladder. No bile duct dilatation. Mild diffuse fatty infiltration of the liver. Pancreas: Unremarkable. No pancreatic ductal dilatation or surrounding inflammatory changes. Spleen: Normal in size without focal abnormality. Adrenals/Urinary Tract: No adrenal gland nodules. Subcentimeter cysts in the kidneys. No hydronephrosis or hydroureter. Renal nephrograms are symmetrical. Bladder is unremarkable. Stomach/Bowel: Stomach, small bowel, and colon are mostly decompressed with scattered stool in the colon. Colonic diverticula without evidence of diverticulitis. Appendix is surgically absent.  Vascular/Lymphatic: No significant vascular findings are present. No enlarged abdominal or pelvic lymph nodes. Reproductive: Diffuse nodular enlargement of the uterus consistent with multiple fibroids. Largest measures up to about 7.4 cm diameter. No abnormal adnexal masses. Other: No free air or free fluid in the abdomen. Abdominal wall musculature appears intact. Musculoskeletal: No acute or significant osseous findings. IMPRESSION: 1. Multiple uterine fibroids similar to previous study. 2. Small esophageal hiatal hernia. 3. No evidence of bowel obstruction or inflammation. 4. Mild diffuse fatty infiltration of the liver. Electronically Signed   By: Lucienne Capers M.D.   On: 04/29/2017 03:37    Procedures Procedures (including critical care time)  Medications Ordered in ED Medications  morphine 4 MG/ML injection 4 mg (4 mg Intravenous Given 04/29/17 0328)  ondansetron (ZOFRAN) injection 4 mg (4 mg Intravenous Given 04/29/17 0328)  hydrALAZINE (APRESOLINE) injection 10 mg (10 mg Intravenous Given 04/29/17 0328)  iopamidol (ISOVUE-300) 61 % injection 100 mL (100 mLs Intravenous Contrast Given 04/29/17 0257)     Initial Impression / Assessment and Plan / ED Course  I have reviewed the triage vital signs and the nursing notes.  Pertinent labs & imaging results that were available during my care of the patient were reviewed by me and considered in my medical decision making (see chart for details).  Clinical Course as of Apr 30 619  Sat Apr 29, 2017  0231 Pt is hypertensive.  She reports not taking her medications tonight because she didn't eat dinner. BP: (!) 199/102 [HM]  [id number] Discussed with Dr. Hal Hope who will admit  [HM]  0617 BP improved significantly after hydralazine BP: (!) 123/57 [HM]    Clinical Course User Index [HM] Muthersbaugh, Jarrett Soho, PA-C    Patient presents with generalized abdominal pain, nausea, vomiting and diarrhea today.  Pain is worsened throughout the day.  Her  mucous membranes are dry.  Labs are indicative of dehydration with ketones in her urine, hyponatremia and hypochloremia.  Patient given fluids here in the emergency department along with pain control.  On repeat exam she reports she is feeling significantly better but does still "feel dehydrated."  She is hemoconcentrated with hemoglobin of greater than 16.  CT scan without acute abnormalities.  I have personally reviewed the images.  Patient will need admission for rehydration and recheck of her labs.  The patient was discussed with and seen by Dr. Randal Buba who agrees with the treatment plan.   Final Clinical Impressions(s) / ED Diagnoses   Final diagnoses:  Generalized abdominal pain  Diarrhea of presumed infectious origin  Dehydration    ED Discharge Orders    None       Agapito Games 04/29/17 [id number]  Palumbo, April, MD 04/29/17 [phone]

## 2017-04-29 NOTE — H&P (Signed)
[facility] History and Physical  Tina Ware HCW:[id number] DOB: [birth year] DOA: 04/28/2017  Referring physician:  PCP: Glendale Chard, MD  Specialists:   Chief Complaint: abdominal pains, vomiting   HPI: Tina Ware is a 77 y.o. female with PMH of HTN, Arthritis, GERD presented with persistent abdominal pains, cramping non radiating central abdominal pains associated with nausea, vomiting since yesterday. She states that she has eaten something and did not like it. She denies hematemesis. She reports several episodes of soft bowel movement but no diarrhea. No dizziness. No fevers, no chills, no acute chest pains, no shortness of breath. She has been having some mild non productive cough for one week.   -ED: looks dehydrated. Sodium -125. CT abd: no acute findings. hospitalist is called for observation   Review of Systems: The patient denies anorexia, fever, weight loss,, vision loss, decreased hearing, hoarseness, chest pain, syncope, dyspnea on exertion, peripheral edema, balance deficits, hemoptysis, abdominal pain, melena, hematochezia, severe indigestion/heartburn, hematuria, incontinence, genital sores, muscle weakness, suspicious skin lesions, transient blindness, difficulty walking, depression, unusual weight change, abnormal bleeding, enlarged lymph nodes, angioedema, and breast masses.    Past Medical History:  Diagnosis Date  . Arthritis    RA  . CAD (coronary artery disease)   . Fibroid, uterine   . Hypertension    Past Surgical History:  Procedure Laterality Date  . CHOLECYSTECTOMY     Social History:  reports that  has never smoked. she has never used smokeless tobacco. She reports that she does not drink alcohol or use drugs. Home;  where does patient live--home, ALF, SNF? and with whom if at home? Yes;  Can patient participate in ADLs?  No Known Allergies  Family History  Problem Relation Age of Onset  . Heart disease Mother     (be sure to  complete)  Prior to Admission medications   Medication Sig Start Date End Date Taking? Authorizing Provider  aspirin EC 81 MG tablet Take 81 mg by mouth daily.   Yes [provider]  benzonatate (TESSALON) 100 MG capsule Take 100 mg by mouth 3 (three) times daily as needed for cough.   Yes [provider]  carvedilol (COREG) 6.25 MG tablet Take 6.25 mg by mouth at bedtime.    Yes [provider]  cholecalciferol (VITAMIN D) 1000 units tablet Take 1,000 Units by mouth daily.   Yes [provider]  furosemide (LASIX) 20 MG tablet Take 20 mg by mouth daily as needed for fluid.    Yes [provider]  hydrocortisone (ANUSOL-HC) 25 MG suppository Place 25 mg rectally 2 (two) times daily as needed for hemorrhoids or itching.   Yes [provider]  mometasone (NASONEX) 50 MCG/ACT nasal spray Place 2 sprays into the nose daily as needed (allergies).   Yes [provider]  Multiple Vitamin (MULTIVITAMIN WITH MINERALS) TABS tablet Take 1 tablet by mouth daily.   Yes [provider]  Omega-3 1000 MG CAPS Take 1,000 mg by mouth daily.   Yes [provider]  potassium chloride SA (K-DUR,KLOR-CON) 20 MEQ tablet Take 20 mEq by mouth 2 (two) times daily.   Yes [provider]  triamterene-hydrochlorothiazide (DYAZIDE) 37.5-25 MG per capsule Take 1 capsule by mouth daily.   Yes [provider]  omeprazole (PRILOSEC) 20 MG capsule Take 1 capsule (20 mg total) by mouth daily. Patient not taking: Reported on 04/28/2017 08/22/15   Malvin Johns, MD   Physical Exam: Vitals:   04/29/17  0457 04/29/17 0500  BP: (!) 111/52 (!) 123/57  Pulse: 96 93  Resp: 19 17  Temp:    SpO2: 97% 96%     General:  Alert. No distress   Eyes: eom-I, perrla   ENT: no oral ulcers   Neck: supple, no JVD  Cardiovascular: s1,s2 rrr  Respiratory: no wheezing   Abdomen: soft, mild central abdominal tender, no rebound   Skin: no  ulcers   Musculoskeletal: no edema   Psychiatric: no halluciantions  Neurologic: cn 2-12 intact. Motor 5/5 bl  Labs on Admission:  Basic Metabolic Panel: Recent Labs  Lab 04/29/17 0055  NA 125*  K 3.6  CL 89*  CO2 26  GLUCOSE 122*  BUN 6  CREATININE 0.67  CALCIUM 9.1   Liver Function Tests: Recent Labs  Lab 04/29/17 0055  AST 29  ALT 17  ALKPHOS 67  BILITOT 0.9  PROT 7.2  ALBUMIN 3.8   Recent Labs  Lab 04/29/17 0055  LIPASE 25   No results for input(s): AMMONIA in the last 168 hours. CBC: Recent Labs  Lab 04/29/17 0055  WBC 4.1  HGB 16.9*  HCT 47.3*  MCV 82.4  PLT 147*   Cardiac Enzymes: No results for input(s): CKTOTAL, CKMB, CKMBINDEX, TROPONINI in the last 168 hours.  BNP (last 3 results) No results for input(s): BNP in the last 8760 hours.  ProBNP (last 3 results) No results for input(s): PROBNP in the last 8760 hours.  CBG: No results for input(s): GLUCAP in the last 168 hours.  Radiological Exams on Admission: Ct Abdomen Pelvis W Contrast  Result Date: 04/29/2017 CLINICAL DATA:  Abdominal distention, nausea and vomiting. Lower abdominal pain. EXAM: CT ABDOMEN AND PELVIS WITH CONTRAST TECHNIQUE: Multidetector CT imaging of the abdomen and pelvis was performed using the standard protocol following bolus administration of intravenous contrast. CONTRAST:  100 mL Isovue-300 COMPARISON:  08/21/2015 FINDINGS: Lower chest: Mild dependent atelectasis in the lung bases. Small esophageal hiatal hernia. Hepatobiliary: Surgical absence of the gallbladder. No bile duct dilatation. Mild diffuse fatty infiltration of the liver. Pancreas: Unremarkable. No pancreatic ductal dilatation or surrounding inflammatory changes. Spleen: Normal in size without focal abnormality. Adrenals/Urinary Tract: No adrenal gland nodules. Subcentimeter cysts in the kidneys. No hydronephrosis or hydroureter. Renal nephrograms are symmetrical. Bladder is unremarkable. Stomach/Bowel:  Stomach, small bowel, and colon are mostly decompressed with scattered stool in the colon. Colonic diverticula without evidence of diverticulitis. Appendix is surgically absent. Vascular/Lymphatic: No significant vascular findings are present. No enlarged abdominal or pelvic lymph nodes. Reproductive: Diffuse nodular enlargement of the uterus consistent with multiple fibroids. Largest measures up to about 7.4 cm diameter. No abnormal adnexal masses. Other: No free air or free fluid in the abdomen. Abdominal wall musculature appears intact. Musculoskeletal: No acute or significant osseous findings. IMPRESSION: 1. Multiple uterine fibroids similar to previous study. 2. Small esophageal hiatal hernia. 3. No evidence of bowel obstruction or inflammation. 4. Mild diffuse fatty infiltration of the liver. Electronically Signed   By: Lucienne Capers M.D.   On: 04/29/2017 03:37    EKG: Independently reviewed.   Assessment/Plan Active Problems:   Dehydration   77 y.o. female with PMH of HTN, Arthritis, GERD presented with persistent abdominal pains, cramping non radiating central abdominal pains associated with nausea, vomiting  Abdominal cramps, nausea, vomiting. Possible gastroenteritis. CT abd: no acute findings. Exam is unremarkable. Afebrile in ED, no leukocytosis.  We will cont supportive care, iv fluids,. Antiemetics. NPO, start diet if improving. Monitor  Acute dehydration due to GI loss. Started on iv fluids. Monitor   Hyponatremia, likely hypovolemic due to dehydration, aldo on diuretics. We will start iv fluids, hold diuretics. Monitor labs, I/o. Avoid acute changes in sodium.   HTN. Cont BB. Hold diuretics due to dehydration, hyponatremia. BP is stable. Monitor   Cough, non resolving. Check CXR.     None,  if consultant consulted, please document name and whether formally or informally consulted  Code Status: full (must indicate code status--if unknown or must be presumed, indicate  so) Family Communication: d/w patient, ED (indicate person spoken with, if applicable, with phone number if by telephone) Disposition Plan: home  (indicate anticipated LOS)  Time spent: >45 minutes   Kinnie Feil [facility] Pager [phone]  If 7PM-7AM, please contact night-coverage [email or web address] Password [facility] 04/29/2017, 8:34 AM

## 2017-04-29 NOTE — ED Notes (Signed)
ED TO INPATIENT HANDOFF REPORT  Name/Age/Gender Tina Ware 77 y.o. female  Code Status Code Status History    This patient does not have a recorded code status. Please follow your organizational policy for patients in this situation.      Home/SNF/Other Home  Chief Complaint Abdominal pain  Level of Care/Admitting Diagnosis ED Disposition    ED Disposition Condition Comment   Admit  Hospital Area: [facility] [[id number]]  Level of Care: Med-Surg [16]  Diagnosis: Dehydration [276.51.ICD-9-CM]  Admitting Physician: Rise Patience [phone]  Attending Physician: Rise Patience [phone]  PT Class (Do Not Modify): Observation [104]  PT Acc Code (Do Not Modify): Observation [10022]       Medical History Past Medical History:  Diagnosis Date  . Arthritis    RA  . CAD (coronary artery disease)   . Fibroid, uterine   . Hypertension     Allergies No Known Allergies  IV Location/Drains/Wounds Patient Lines/Drains/Airways Status   Active Line/Drains/Airways    Name:   Placement date:   Placement time:   Site:   Days:   Peripheral IV 04/29/17 Right Antecubital   04/29/17    0057    Antecubital   less than 1          Labs/Imaging Results for orders placed or performed during the hospital encounter of 04/28/17 (from the past 48 hour(s))  Lipase, blood     Status: None   Collection Time: 04/29/17 12:55 AM  Result Value Ref Range   Lipase 25 11 - 51 U/L    Comment: Performed at [facility], [place] [address] [place], [place] [address]  Comprehensive metabolic panel     Status: Abnormal   Collection Time: 04/29/17 12:55 AM  Result Value Ref Range   Sodium 125 (L) 135 - 145 mmol/L   Potassium 3.6 3.5 - 5.1 mmol/L   Chloride 89 (L) 101 - 111 mmol/L   CO2 26 22 - 32 mmol/L   Glucose, Bld 122 (H) 65 - 99 mg/dL   BUN 6 6 - 20 mg/dL   Creatinine, Ser 0.67 0.44 - 1.00 mg/dL   Calcium 9.1 8.9 - 10.3 mg/dL   Total Protein  7.2 6.5 - 8.1 g/dL   Albumin 3.8 3.5 - 5.0 g/dL   AST 29 15 - 41 U/L   ALT 17 14 - 54 U/L   Alkaline Phosphatase 67 38 - 126 U/L   Total Bilirubin 0.9 0.3 - 1.2 mg/dL   GFR calc non Af Amer >60 >60 mL/min   GFR calc Af Amer >60 >60 mL/min    Comment: (NOTE) The eGFR has been calculated using the CKD EPI equation. This calculation has not been validated in all clinical situations. eGFR's persistently <60 mL/min signify possible Chronic Kidney Disease.    Anion gap 10 5 - 15    Comment: Performed at [facility], [place] [address] [place], [place] [address]  CBC     Status: Abnormal   Collection Time: 04/29/17 12:55 AM  Result Value Ref Range   WBC 4.1 4.0 - 10.5 K/uL   RBC 5.74 (H) 3.87 - 5.11 MIL/uL   Hemoglobin 16.9 (H) 12.0 - 15.0 g/dL   HCT 47.3 (H) 36.0 - 46.0 %   MCV 82.4 78.0 - 100.0 fL   MCH 29.4 26.0 - 34.0 pg   MCHC 35.7 30.0 - 36.0 g/dL   RDW 12.8 11.5 - 15.5 %   Platelets 147 (  L) 150 - 400 K/uL    Comment: Performed at [facility], [place] [address] [place], [place] [address]  Urinalysis, Routine w reflex microscopic     Status: Abnormal   Collection Time: 04/29/17 12:55 AM  Result Value Ref Range   Color, Urine YELLOW YELLOW   APPearance CLEAR CLEAR   Specific Gravity, Urine 1.016 1.005 - 1.030   pH 7.0 5.0 - 8.0   Glucose, UA NEGATIVE NEGATIVE mg/dL   Hgb urine dipstick NEGATIVE NEGATIVE   Bilirubin Urine NEGATIVE NEGATIVE   Ketones, ur 80 (A) NEGATIVE mg/dL   Protein, ur 30 (A) NEGATIVE mg/dL   Nitrite NEGATIVE NEGATIVE   Leukocytes, UA NEGATIVE NEGATIVE   RBC / HPF 0-5 0 - 5 RBC/hpf   WBC, UA 0-5 0 - 5 WBC/hpf   Bacteria, UA RARE (A) NONE SEEN   Squamous Epithelial / LPF 0-5 (A) NONE SEEN   Mucus PRESENT    Hyaline Casts, UA PRESENT     Comment: Performed at [facility], [place] [address] [place],  [address]  I-stat troponin, ED     Status: None   Collection Time: 04/29/17  1:10 AM  Result  Value Ref Range   Troponin i, poc 0.01 0.00 - 0.08 ng/mL   Comment 3            Comment: Due to the release kinetics of cTnI, a negative result within the first hours of the onset of symptoms does not rule out myocardial infarction with certainty. If myocardial infarction is still suspected, repeat the test at appropriate intervals.   I-Stat CG4 Lactic Acid, ED     Status: None   Collection Time: 04/29/17  5:14 AM  Result Value Ref Range   Lactic Acid, Venous 0.83 0.5 - 1.9 mmol/L   Ct Abdomen Pelvis W Contrast  Result Date: 04/29/2017 CLINICAL DATA:  Abdominal distention, nausea and vomiting. Lower abdominal pain. EXAM: CT ABDOMEN AND PELVIS WITH CONTRAST TECHNIQUE: Multidetector CT imaging of the abdomen and pelvis was performed using the standard protocol following bolus administration of intravenous contrast. CONTRAST:  100 mL Isovue-300 COMPARISON:  08/21/2015 FINDINGS: Lower chest: Mild dependent atelectasis in the lung bases. Small esophageal hiatal hernia. Hepatobiliary: Surgical absence of the gallbladder. No bile duct dilatation. Mild diffuse fatty infiltration of the liver. Pancreas: Unremarkable. No pancreatic ductal dilatation or surrounding inflammatory changes. Spleen: Normal in size without focal abnormality. Adrenals/Urinary Tract: No adrenal gland nodules. Subcentimeter cysts in the kidneys. No hydronephrosis or hydroureter. Renal nephrograms are symmetrical. Bladder is unremarkable. Stomach/Bowel: Stomach, small bowel, and colon are mostly decompressed with scattered stool in the colon. Colonic diverticula without evidence of diverticulitis. Appendix is surgically absent. Vascular/Lymphatic: No significant vascular findings are present. No enlarged abdominal or pelvic lymph nodes. Reproductive: Diffuse nodular enlargement of the uterus consistent with multiple fibroids. Largest measures up to about 7.4 cm diameter. No abnormal adnexal masses. Other: No free air or free fluid in the  abdomen. Abdominal wall musculature appears intact. Musculoskeletal: No acute or significant osseous findings. IMPRESSION: 1. Multiple uterine fibroids similar to previous study. 2. Small esophageal hiatal hernia. 3. No evidence of bowel obstruction or inflammation. 4. Mild diffuse fatty infiltration of the liver. Electronically Signed   By: Lucienne Capers M.D.   On: 04/29/2017 03:37    Pending Labs Unresulted Labs (From admission, onward)   None      Vitals/Pain Today's Vitals   04/29/17 0401 04/29/17 0430 04/29/17 0457 04/29/17 0500  BP:  (!) 111/52 (!) 111/52 (!) 123/57  Pulse:  77 96 93  Resp:  '18 19 17  ' Temp:      TempSrc:      SpO2:  99% 97% 96%  Weight:      Height:      PainSc: 2   2      Isolation Precautions No active isolations  Medications Medications  morphine 4 MG/ML injection 4 mg (4 mg Intravenous Given 04/29/17 0328)  ondansetron (ZOFRAN) injection 4 mg (4 mg Intravenous Given 04/29/17 0328)  hydrALAZINE (APRESOLINE) injection 10 mg (10 mg Intravenous Given 04/29/17 0328)  iopamidol (ISOVUE-300) 61 % injection 100 mL (100 mLs Intravenous Contrast Given 04/29/17 0257)    Mobility walks

## 2017-04-29 NOTE — ED Notes (Signed)
Patient in CT

## 2017-04-29 NOTE — ED Notes (Signed)
Attempted to call report at this time but nurse is currently busy with another patient and they will call me back.

## 2017-04-30 DIAGNOSIS — R1084 Generalized abdominal pain: Secondary | ICD-10-CM | POA: Diagnosis present

## 2017-04-30 DIAGNOSIS — Z7982 Long term (current) use of aspirin: Secondary | ICD-10-CM | POA: Diagnosis not present

## 2017-04-30 DIAGNOSIS — E860 Dehydration: Secondary | ICD-10-CM | POA: Diagnosis present

## 2017-04-30 DIAGNOSIS — R197 Diarrhea, unspecified: Secondary | ICD-10-CM | POA: Diagnosis present

## 2017-04-30 DIAGNOSIS — E861 Hypovolemia: Secondary | ICD-10-CM | POA: Diagnosis present

## 2017-04-30 DIAGNOSIS — E878 Other disorders of electrolyte and fluid balance, not elsewhere classified: Secondary | ICD-10-CM | POA: Diagnosis present

## 2017-04-30 DIAGNOSIS — I10 Essential (primary) hypertension: Secondary | ICD-10-CM | POA: Diagnosis present

## 2017-04-30 DIAGNOSIS — I2510 Atherosclerotic heart disease of native coronary artery without angina pectoris: Secondary | ICD-10-CM | POA: Diagnosis present

## 2017-04-30 DIAGNOSIS — E871 Hypo-osmolality and hyponatremia: Secondary | ICD-10-CM | POA: Diagnosis present

## 2017-04-30 DIAGNOSIS — K219 Gastro-esophageal reflux disease without esophagitis: Secondary | ICD-10-CM | POA: Diagnosis present

## 2017-04-30 DIAGNOSIS — K3184 Gastroparesis: Secondary | ICD-10-CM | POA: Diagnosis present

## 2017-04-30 DIAGNOSIS — Z8249 Family history of ischemic heart disease and other diseases of the circulatory system: Secondary | ICD-10-CM | POA: Diagnosis not present

## 2017-04-30 DIAGNOSIS — R600 Localized edema: Secondary | ICD-10-CM | POA: Diagnosis present

## 2017-04-30 LAB — BASIC METABOLIC PANEL
Anion gap: 7 (ref 5–15)
BUN: 11 mg/dL (ref 6–20)
CO2: 25 mmol/L (ref 22–32)
Calcium: 8.7 mg/dL — ABNORMAL LOW (ref 8.9–10.3)
Chloride: 97 mmol/L — ABNORMAL LOW (ref 101–111)
Creatinine, Ser: 0.62 mg/dL (ref 0.44–1.00)
GFR calc Af Amer: >60 mL/min (ref >60)
GFR calc non Af Amer: >60 mL/min (ref >60)
Glucose, Bld: 111 mg/dL — ABNORMAL HIGH (ref 65–99)
Potassium: 3.6 mmol/L (ref 3.5–5.1)
Sodium: 129 mmol/L — ABNORMAL LOW (ref 135–145)

## 2017-04-30 MED ORDER — ONDANSETRON HCL 4 MG/2ML IJ SOLN
4.0000 mg | Freq: Four times a day (QID) | INTRAMUSCULAR | Status: DC | PRN
  Administered 2017-04-30: 4 mg via INTRAVENOUS
  Filled 2017-04-30: qty 2

## 2017-04-30 MED ORDER — HYDRALAZINE HCL 20 MG/ML IJ SOLN: 10.0000 mg | Freq: Three times a day (TID) | INTRAMUSCULAR | Status: DC

## 2017-04-30 MED ORDER — SODIUM CHLORIDE 0.9 % IV SOLN
INTRAVENOUS | Status: DC
  Administered 2017-04-30: 1000 mL via INTRAVENOUS

## 2017-04-30 NOTE — Progress Notes (Signed)
[name], female   DOB: Sep 20, 1940, 77 y.o.   MRN: [id number]  PROGRESS NOTE    MARDI CANNADY  [id number]:[id number] DOB: 16-Feb-1941 DOA: 04/28/2017  PCP: No primary care provider on file.   Brief Narrative:  77 y.o. female with PMH of HTN, Arthritis, GERD presented with persistent abdominal pains, cramping non radiating central abdominal pains associated with nausea, vomiting.  On admission, pt was hemodynamically stable. Sodium was 125. CT abd without acute findings. She was started on IV fluids for hydration.   Assessment & Plan:   Active Problems: Abdominal pain, nausea and vomiting - No fevers - Possible gastroenteritis - CT abd without acute findings - Adv diet to clears - Continue IV fluids and antiemetics PRN  Acute dehydration - Continue IV fluids   Hyponatremia - Due to dehydration - Continue IV fluids  - Follow up BMP in am  Hypertension, essential - Continue hydralazine PRN - Continue coreg   DVT prophylaxis: Lovenox subQ Code Status: full code  Family Communication: family at the bedside Disposition Plan: home once tolerates regular diet   Consultants:   None  Procedures:   None  Antimicrobials:   None   Subjective: No overnight events.  Objective: Vitals:   04/29/17 1752 04/29/17 2005 04/30/17 0443 04/30/17 1316  BP: (!) 168/85 (!) 167/83 (!) 197/95 (!) 167/93  Pulse:  87 80 88  Resp:  18 18 18   Temp:  98.5 F (36.9 C) 98 F (36.7 C) 98.1 F (36.7 C)  TempSrc:  Oral Oral Oral  SpO2:  98% 98% 99%  Weight:      Height:        Intake/Output Summary (Last 24 hours) at 04/30/2017 1401 Last data filed at 04/30/2017 1323 Gross per 24 hour  Intake 480 ml  Output 1100 ml  Net -620 ml   Filed Weights   04/29/17 0058 04/29/17 0851  Weight: 82.6 kg (182 lb) 77.5 kg (170 lb 13.7 oz)    Examination:  General exam: Appears calm and comfortable  Respiratory system: Clear to auscultation. Respiratory effort  normal. Cardiovascular system: S1 & S2 heard, RRR.  Gastrointestinal system: Abdomen is nondistended, soft and nontender. No organomegaly or masses felt. Normal bowel sounds heard. Central nervous system: Alert and oriented. No focal neurological deficits. Extremities: Symmetric 5 x 5 power. Skin: No rashes, lesions or ulcers Psychiatry: Judgement and insight appear normal. Mood & affect appropriate.   Data Reviewed: I have personally reviewed following labs and imaging studies  CBC: Recent Labs  Lab 04/29/17 0055  WBC 4.1  HGB 16.9*  HCT 47.3*  MCV 82.4  PLT 382*   Basic Metabolic Panel: Recent Labs  Lab 04/29/17 0055 04/30/17 0421  NA 125* 129*  K 3.6 3.6  CL 89* 97*  CO2 26 25  GLUCOSE 122* 111*  BUN 6 11  CREATININE 0.67 0.62  CALCIUM 9.1 8.7*   GFR: Estimated Creatinine Clearance: 58.9 mL/min (by C-G formula based on SCr of 0.62 mg/dL). Liver Function Tests: Recent Labs  Lab 04/29/17 0055  AST 29  ALT 17  ALKPHOS 67  BILITOT 0.9  PROT 7.2  ALBUMIN 3.8   Recent Labs  Lab 04/29/17 0055  LIPASE 25   No results for input(s): AMMONIA in the last 168 hours. Coagulation Profile: No results for input(s): INR, PROTIME in the last 168 hours. Cardiac Enzymes: No results for input(s): CKTOTAL, CKMB, CKMBINDEX, TROPONINI in the last 168 hours. BNP (last 3 results) No results for  input(s): PROBNP in the last 8760 hours. HbA1C: No results for input(s): HGBA1C in the last 72 hours. CBG: No results for input(s): GLUCAP in the last 168 hours. Lipid Profile: No results for input(s): CHOL, HDL, LDLCALC, TRIG, CHOLHDL, LDLDIRECT in the last 72 hours. Thyroid Function Tests: No results for input(s): TSH, T4TOTAL, FREET4, T3FREE, THYROIDAB in the last 72 hours. Anemia Panel: No results for input(s): VITAMINB12, FOLATE, FERRITIN, TIBC, IRON, RETICCTPCT in the last 72 hours. Urine analysis:    Component Value Date/Time   COLORURINE YELLOW 04/29/2017 0055    APPEARANCEUR CLEAR 04/29/2017 0055   LABSPEC 1.016 04/29/2017 0055   PHURINE 7.0 04/29/2017 0055   GLUCOSEU NEGATIVE 04/29/2017 0055   HGBUR NEGATIVE 04/29/2017 0055   BILIRUBINUR NEGATIVE 04/29/2017 0055   KETONESUR 80 (A) 04/29/2017 0055   PROTEINUR 30 (A) 04/29/2017 0055   NITRITE NEGATIVE 04/29/2017 0055   LEUKOCYTESUR NEGATIVE 04/29/2017 0055   Sepsis Labs: @LABRCNTIP (procalcitonin:4,lacticidven:4)   )No results found for this or any previous visit (from the past 240 hour(s)).    Radiology Studies: Dg Chest 2 View  Result Date: 04/29/2017 CLINICAL DATA:  Weakness, nausea vomiting cough for 3 days. EXAM: CHEST  2 VIEW COMPARISON:  09/23/2004 FINDINGS: Cardiomediastinal silhouette is normal. Mediastinal contours appear intact. Calcific atherosclerotic disease of the aorta. There is no evidence of focal airspace consolidation, pleural effusion or pneumothorax. Mild peribronchial prominence. Osseous structures are without acute abnormality. Soft tissues are grossly normal. IMPRESSION: Mild peribronchial prominence which may be seen with acute bronchitis or reactive airway disease. Electronically Signed   By: Fidela Salisbury M.D.   On: 04/29/2017 10:15   Ct Abdomen Pelvis W Contrast  Result Date: 04/29/2017 CLINICAL DATA:  Abdominal distention, nausea and vomiting. Lower abdominal pain. EXAM: CT ABDOMEN AND PELVIS WITH CONTRAST TECHNIQUE: Multidetector CT imaging of the abdomen and pelvis was performed using the standard protocol following bolus administration of intravenous contrast. CONTRAST:  100 mL Isovue-300 COMPARISON:  08/21/2015 FINDINGS: Lower chest: Mild dependent atelectasis in the lung bases. Small esophageal hiatal hernia. Hepatobiliary: Surgical absence of the gallbladder. No bile duct dilatation. Mild diffuse fatty infiltration of the liver. Pancreas: Unremarkable. No pancreatic ductal dilatation or surrounding inflammatory changes. Spleen: Normal in size without focal  abnormality. Adrenals/Urinary Tract: No adrenal gland nodules. Subcentimeter cysts in the kidneys. No hydronephrosis or hydroureter. Renal nephrograms are symmetrical. Bladder is unremarkable. Stomach/Bowel: Stomach, small bowel, and colon are mostly decompressed with scattered stool in the colon. Colonic diverticula without evidence of diverticulitis. Appendix is surgically absent. Vascular/Lymphatic: No significant vascular findings are present. No enlarged abdominal or pelvic lymph nodes. Reproductive: Diffuse nodular enlargement of the uterus consistent with multiple fibroids. Largest measures up to about 7.4 cm diameter. No abnormal adnexal masses. Other: No free air or free fluid in the abdomen. Abdominal wall musculature appears intact. Musculoskeletal: No acute or significant osseous findings. IMPRESSION: 1. Multiple uterine fibroids similar to previous study. 2. Small esophageal hiatal hernia. 3. No evidence of bowel obstruction or inflammation. 4. Mild diffuse fatty infiltration of the liver. Electronically Signed   By: Lucienne Capers M.D.   On: 04/29/2017 03:37      Scheduled Meds: . aspirin EC  81 mg Oral Daily  . carvedilol  6.25 mg Oral QHS  . cholecalciferol  1,000 Units Oral Daily  . enoxaparin (LOVENOX) injection  40 mg Subcutaneous Q24H  . fluticasone  1 spray Each Nare Daily  . multivitamin with minerals  1 tablet Oral Daily  . omega-3 acid  ethyl esters  1 g Oral BID  . potassium chloride SA  20 mEq Oral BID   Continuous Infusions: . sodium chloride       LOS: 0 days    Time spent: 25 minutes  Greater than 50% of the time spent on counseling and coordinating the care.   Leisa Lenz, MD [facility] Pager [phone]  If 7PM-7AM, please contact night-coverage [email or web address] Password [email or web address] 04/30/2017, 2:01 PM

## 2017-05-01 LAB — CBC
HCT: 42.4 % (ref 36.0–46.0)
Hemoglobin: 15.3 g/dL — ABNORMAL HIGH (ref 12.0–15.0)
MCH: 29.4 pg (ref 26.0–34.0)
MCHC: 36.1 g/dL — ABNORMAL HIGH (ref 30.0–36.0)
MCV: 81.5 fL (ref 78.0–100.0)
Platelets: 153 10*3/uL (ref 150–400)
RBC: 5.2 MIL/uL — ABNORMAL HIGH (ref 3.87–5.11)
RDW: 13.1 % (ref 11.5–15.5)
WBC: 8.4 10*3/uL (ref 4.0–10.5)

## 2017-05-01 LAB — BASIC METABOLIC PANEL
Anion gap: 6 (ref 5–15)
BUN: 11 mg/dL (ref 6–20)
CO2: 24 mmol/L (ref 22–32)
Calcium: 8.4 mg/dL — ABNORMAL LOW (ref 8.9–10.3)
Chloride: 95 mmol/L — ABNORMAL LOW (ref 101–111)
Creatinine, Ser: 0.53 mg/dL (ref 0.44–1.00)
GFR calc Af Amer: >60 mL/min (ref >60)
GFR calc non Af Amer: >60 mL/min (ref >60)
Glucose, Bld: 125 mg/dL — ABNORMAL HIGH (ref 65–99)
Potassium: 3.4 mmol/L — ABNORMAL LOW (ref 3.5–5.1)
Sodium: 125 mmol/L — ABNORMAL LOW (ref 135–145)

## 2017-05-01 MED ORDER — LABETALOL HCL 100 MG PO TABS: 100.0000 mg | ORAL_TABLET | Freq: Two times a day (BID) | ORAL | Status: DC

## 2017-05-01 MED ORDER — AMLODIPINE BESYLATE 5 MG PO TABS
5.0000 mg | ORAL_TABLET | Freq: Every day | ORAL | Status: DC
  Administered 2017-05-01 – 2017-05-04 (×4): 5 mg via ORAL
  Filled 2017-05-01 (×4): qty 1

## 2017-05-01 NOTE — Progress Notes (Signed)
[name], female   DOB: [birth year], 77 y.o.   MRN: [id number]  PROGRESS NOTE    Tina Ware  QPY:[id number] DOB: [birth year] DOA: 04/28/2017  PCP: No primary care provider on file.   Brief Narrative:  77 y.o. female with PMH of HTN, Arthritis, GERD presented with persistent abdominal pains, cramping non radiating central abdominal pains associated with nausea, vomiting.  On admission, pt was hemodynamically stable. Sodium was 125. CT abd without acute findings. She was started on IV fluids for hydration.   Assessment & Plan:   Active Problems: Abdominal pain, nausea and vomiting - No fevers - Possible gastroenteritis - CT abd without acute findings - Prefers to stay on CLD because of nausea   Acute dehydration - Resolved - Will stop IV fluids, she also reports feeling more swelling; no edema in LE  Hyponatremia - Due to dehydration - Sodium 125 this am - Follow up BMP in am   Hypertension, essential - Continue hydralazine PRN - Continue coreg   DVT prophylaxis: Lovenox subQ Code Status: full code  Family Communication: no family at bedside this am Disposition Plan: home once pt tolerates regular diet    Consultants:   None  Procedures:   None  Antimicrobials:   None   Subjective: No overnight events.  Objective: Vitals:   04/30/17 2301 05/01/17 0530 05/01/17 0555 05/01/17 0604  BP: (!) 145/88 (!) 209/117 (!) 182/93 (!) 175/87  Pulse: 99 92 98 97  Resp: 16 16    Temp: 98.2 F (36.8 C) 97.8 F (36.6 C)    TempSrc: Oral Oral    SpO2: 98% 100%    Weight:      Height:        Intake/Output Summary (Last 24 hours) at 05/01/2017 1023 Last data filed at 05/01/2017 0130 Gross per 24 hour  Intake 1163.75 ml  Output 300 ml  Net 863.75 ml   Filed Weights   04/29/17 0058 04/29/17 0851  Weight: 82.6 kg (182 lb) 77.5 kg (170 lb 13.7 oz)    Physical Exam  Constitutional: Appears well-developed and well-nourished. No distress.    CVS: RRR, S1/S2 + Pulmonary: Effort and breath sounds normal, no stridor, rhonchi, wheezes, rales.  Abdominal: Soft. BS +,  no distension, tender in mid abdomen  Musculoskeletal: Normal range of motion. No edema and no tenderness.  Lymphadenopathy: No lymphadenopathy noted, cervical, inguinal. Neuro: Alert. Normal reflexes, muscle tone coordination. No cranial nerve deficit. Skin: Skin is warm and dry.  Psychiatric: Normal mood and affect. Behavior, judgment, thought content normal.    Data Reviewed: I have personally reviewed following labs and imaging studies  CBC: Recent Labs  Lab 04/29/17 0055 05/01/17 0356  WBC 4.1 8.4  HGB 16.9* 15.3*  HCT 47.3* 42.4  MCV 82.4 81.5  PLT 147* 124   Basic Metabolic Panel: Recent Labs  Lab 04/29/17 0055 04/30/17 0421 05/01/17 0356  NA 125* 129* 125*  K 3.6 3.6 3.4*  CL 89* 97* 95*  CO2 26 25 24   GLUCOSE 122* 111* 125*  BUN 6 11 11   CREATININE 0.67 0.62 0.53  CALCIUM 9.1 8.7* 8.4*   GFR: Estimated Creatinine Clearance: 58.9 mL/min (by C-G formula based on SCr of 0.53 mg/dL). Liver Function Tests: Recent Labs  Lab 04/29/17 0055  AST 29  ALT 17  ALKPHOS 67  BILITOT 0.9  PROT 7.2  ALBUMIN 3.8   Recent Labs  Lab 04/29/17 0055  LIPASE 25   No results for input(s):  AMMONIA in the last 168 hours. Coagulation Profile: No results for input(s): INR, PROTIME in the last 168 hours. Cardiac Enzymes: No results for input(s): CKTOTAL, CKMB, CKMBINDEX, TROPONINI in the last 168 hours. BNP (last 3 results) No results for input(s): PROBNP in the last 8760 hours. HbA1C: No results for input(s): HGBA1C in the last 72 hours. CBG: No results for input(s): GLUCAP in the last 168 hours. Lipid Profile: No results for input(s): CHOL, HDL, LDLCALC, TRIG, CHOLHDL, LDLDIRECT in the last 72 hours. Thyroid Function Tests: No results for input(s): TSH, T4TOTAL, FREET4, T3FREE, THYROIDAB in the last 72 hours. Anemia Panel: No results for  input(s): VITAMINB12, FOLATE, FERRITIN, TIBC, IRON, RETICCTPCT in the last 72 hours. Urine analysis:    Component Value Date/Time   COLORURINE YELLOW 04/29/2017 0055   APPEARANCEUR CLEAR 04/29/2017 0055   LABSPEC 1.016 04/29/2017 0055   PHURINE 7.0 04/29/2017 0055   GLUCOSEU NEGATIVE 04/29/2017 0055   HGBUR NEGATIVE 04/29/2017 0055   BILIRUBINUR NEGATIVE 04/29/2017 0055   KETONESUR 80 (A) 04/29/2017 0055   PROTEINUR 30 (A) 04/29/2017 0055   NITRITE NEGATIVE 04/29/2017 0055   LEUKOCYTESUR NEGATIVE 04/29/2017 0055   Sepsis Labs: @LABRCNTIP (procalcitonin:4,lacticidven:4)   )No results found for this or any previous visit (from the past 240 hour(s)).    Radiology Studies: Dg Chest 2 View  Result Date: 04/29/2017 CLINICAL DATA:  Weakness, nausea vomiting cough for 3 days. EXAM: CHEST  2 VIEW COMPARISON:  09/23/2004 FINDINGS: Cardiomediastinal silhouette is normal. Mediastinal contours appear intact. Calcific atherosclerotic disease of the aorta. There is no evidence of focal airspace consolidation, pleural effusion or pneumothorax. Mild peribronchial prominence. Osseous structures are without acute abnormality. Soft tissues are grossly normal. IMPRESSION: Mild peribronchial prominence which may be seen with acute bronchitis or reactive airway disease. Electronically Signed   By: Fidela Salisbury M.D.   On: 04/29/2017 10:15   Ct Abdomen Pelvis W Contrast  Result Date: 04/29/2017 CLINICAL DATA:  Abdominal distention, nausea and vomiting. Lower abdominal pain. EXAM: CT ABDOMEN AND PELVIS WITH CONTRAST TECHNIQUE: Multidetector CT imaging of the abdomen and pelvis was performed using the standard protocol following bolus administration of intravenous contrast. CONTRAST:  100 mL Isovue-300 COMPARISON:  08/21/2015 FINDINGS: Lower chest: Mild dependent atelectasis in the lung bases. Small esophageal hiatal hernia. Hepatobiliary: Surgical absence of the gallbladder. No bile duct dilatation. Mild  diffuse fatty infiltration of the liver. Pancreas: Unremarkable. No pancreatic ductal dilatation or surrounding inflammatory changes. Spleen: Normal in size without focal abnormality. Adrenals/Urinary Tract: No adrenal gland nodules. Subcentimeter cysts in the kidneys. No hydronephrosis or hydroureter. Renal nephrograms are symmetrical. Bladder is unremarkable. Stomach/Bowel: Stomach, small bowel, and colon are mostly decompressed with scattered stool in the colon. Colonic diverticula without evidence of diverticulitis. Appendix is surgically absent. Vascular/Lymphatic: No significant vascular findings are present. No enlarged abdominal or pelvic lymph nodes. Reproductive: Diffuse nodular enlargement of the uterus consistent with multiple fibroids. Largest measures up to about 7.4 cm diameter. No abnormal adnexal masses. Other: No free air or free fluid in the abdomen. Abdominal wall musculature appears intact. Musculoskeletal: No acute or significant osseous findings. IMPRESSION: 1. Multiple uterine fibroids similar to previous study. 2. Small esophageal hiatal hernia. 3. No evidence of bowel obstruction or inflammation. 4. Mild diffuse fatty infiltration of the liver. Electronically Signed   By: Lucienne Capers M.D.   On: 04/29/2017 03:37      Scheduled Meds: . aspirin EC  81 mg Oral Daily  . carvedilol  6.25 mg Oral QHS  .  cholecalciferol  1,000 Units Oral Daily  . enoxaparin (LOVENOX) injection  40 mg Subcutaneous Q24H  . fluticasone  1 spray Each Nare Daily  . multivitamin with minerals  1 tablet Oral Daily  . omega-3 acid ethyl esters  1 g Oral BID  . potassium chloride SA  20 mEq Oral BID   Continuous Infusions:    LOS: 1 day    Time spent: 25 minutes  Greater than 50% of the time spent on counseling and coordinating the care.   Leisa Lenz, MD [facility] Pager [phone]  If 7PM-7AM, please contact night-coverage [email or web address] Password [facility] 05/01/2017, 10:23 AM

## 2017-05-02 LAB — BASIC METABOLIC PANEL
Anion gap: 7 (ref 5–15)
BUN: 9 mg/dL (ref 6–20)
CO2: 22 mmol/L (ref 22–32)
Calcium: 8.3 mg/dL — ABNORMAL LOW (ref 8.9–10.3)
Chloride: 92 mmol/L — ABNORMAL LOW (ref 101–111)
Creatinine, Ser: 0.53 mg/dL (ref 0.44–1.00)
GFR calc Af Amer: >60 mL/min (ref >60)
GFR calc non Af Amer: >60 mL/min (ref >60)
Glucose, Bld: 143 mg/dL — ABNORMAL HIGH (ref 65–99)
Potassium: 3.9 mmol/L (ref 3.5–5.1)
Sodium: 121 mmol/L — ABNORMAL LOW (ref 135–145)

## 2017-05-02 LAB — CBC
HCT: 44 % (ref 36.0–46.0)
Hemoglobin: 16.1 g/dL — ABNORMAL HIGH (ref 12.0–15.0)
MCH: 30 pg (ref 26.0–34.0)
MCHC: 36.6 g/dL — ABNORMAL HIGH (ref 30.0–36.0)
MCV: 82.1 fL (ref 78.0–100.0)
Platelets: 138 10*3/uL — ABNORMAL LOW (ref 150–400)
RBC: 5.36 MIL/uL — ABNORMAL HIGH (ref 3.87–5.11)
RDW: 13.2 % (ref 11.5–15.5)
WBC: 5.4 10*3/uL (ref 4.0–10.5)

## 2017-05-02 MED ORDER — SODIUM CHLORIDE 0.9 % IV SOLN
INTRAVENOUS | Status: DC
  Administered 2017-05-02 – 2017-05-03 (×2): via INTRAVENOUS

## 2017-05-02 NOTE — Progress Notes (Signed)
[name], female   DOB: 16-May-1940, 77 y.o.   MRN: [id number]  PROGRESS NOTE    AUDELIA KNAPE  UMP:[id number] DOB: 02-Dec-1940 DOA: 04/28/2017  PCP: No primary care provider on file.   Brief Narrative:  77 y.o. female with PMH of HTN, Arthritis, GERD presented with persistent abdominal pains, cramping non radiating central abdominal pains associated with nausea, vomiting.  On admission, pt was hemodynamically stable. Sodium was 125. CT abd without acute findings. She was started on IV fluids for hydration.   Assessment & Plan:   Active Problems: Abdominal pain, nausea and vomiting - Likely due to gastroparesis - No acute findings and CT scan of the abdomen - She feels little bit better so we will advance diet from full liquids to soft with anticipation she can go home tomorrow  Acute dehydration - Resolved   Hyponatremia - Likely from dehydration but we stopped IV fluids yesterday 2/4 because she felt she is swollen from IV fluids. No edema today in UE or LE so will start NS at 50 cc/hr and follow up BMP in am  Hypertension, essential - Continue coreg - Added Norvasc for better blood pressure control  - BP 165/80 this am  DVT prophylaxis: SCD's Code Status: full code  Family Communication: no family at bedside this am Disposition Plan: home in am if she tolerates soft diet and if sodium improves    Consultants:   None  Procedures:   None  Antimicrobials:   None   Subjective: No overnight events.   Objective: Vitals:   05/01/17 1747 05/01/17 2036 05/02/17 0442 05/02/17 0502  BP: (!) 185/94 (!) 185/85 (!) 179/91 (!) 165/80  Pulse: 95 88 88   Resp: 20 18 18    Temp: 98.4 F (36.9 C) 97.7 F (36.5 C) 98 F (36.7 C)   TempSrc: Oral Oral Oral   SpO2: 98% 100% 99%   Weight:      Height:        Intake/Output Summary (Last 24 hours) at 05/02/2017 0907 Last data filed at 05/01/2017 1500 Gross per 24 hour  Intake 840 ml  Output -  Net  840 ml   Filed Weights   04/29/17 0058 04/29/17 0851  Weight: 82.6 kg (182 lb) 77.5 kg (170 lb 13.7 oz)    Physical Exam  Constitutional: Appears well-developed and well-nourished. No distress.  CVS: RRR, S1/S2 + Pulmonary: Effort and breath sounds normal, no stridor, rhonchi, wheezes, rales.  Abdominal: Soft. BS +,  no distension, tenderness, rebound or guarding.  Musculoskeletal: Normal range of motion. No  tenderness.  Lymphadenopathy: No lymphadenopathy noted, cervical, inguinal. Neuro: Alert. Normal reflexes, muscle tone coordination. No cranial nerve deficit. Skin: Skin is warm and dry.  Psychiatric: Normal mood and affect. Behavior, judgment, thought content normal.    Data Reviewed: I have personally reviewed following labs and imaging studies  CBC: Recent Labs  Lab 04/29/17 0055 05/01/17 0356 05/02/17 0418  WBC 4.1 8.4 5.4  HGB 16.9* 15.3* 16.1*  HCT 47.3* 42.4 44.0  MCV 82.4 81.5 82.1  PLT 147* 153 400*   Basic Metabolic Panel: Recent Labs  Lab 04/29/17 0055 04/30/17 0421 05/01/17 0356 05/02/17 0418  NA 125* 129* 125* 121*  K 3.6 3.6 3.4* 3.9  CL 89* 97* 95* 92*  CO2 26 25 24 22   GLUCOSE 122* 111* 125* 143*  BUN 6 11 11 9   CREATININE 0.67 0.62 0.53 0.53  CALCIUM 9.1 8.7* 8.4* 8.3*   GFR: Estimated  Creatinine Clearance: 58.9 mL/min (by C-G formula based on SCr of 0.53 mg/dL). Liver Function Tests: Recent Labs  Lab 04/29/17 0055  AST 29  ALT 17  ALKPHOS 67  BILITOT 0.9  PROT 7.2  ALBUMIN 3.8   Recent Labs  Lab 04/29/17 0055  LIPASE 25   No results for input(s): AMMONIA in the last 168 hours. Coagulation Profile: No results for input(s): INR, PROTIME in the last 168 hours. Cardiac Enzymes: No results for input(s): CKTOTAL, CKMB, CKMBINDEX, TROPONINI in the last 168 hours. BNP (last 3 results) No results for input(s): PROBNP in the last 8760 hours. HbA1C: No results for input(s): HGBA1C in the last 72 hours. CBG: No results for  input(s): GLUCAP in the last 168 hours. Lipid Profile: No results for input(s): CHOL, HDL, LDLCALC, TRIG, CHOLHDL, LDLDIRECT in the last 72 hours. Thyroid Function Tests: No results for input(s): TSH, T4TOTAL, FREET4, T3FREE, THYROIDAB in the last 72 hours. Anemia Panel: No results for input(s): VITAMINB12, FOLATE, FERRITIN, TIBC, IRON, RETICCTPCT in the last 72 hours. Urine analysis:    Component Value Date/Time   COLORURINE YELLOW 04/29/2017 0055   APPEARANCEUR CLEAR 04/29/2017 0055   LABSPEC 1.016 04/29/2017 0055   PHURINE 7.0 04/29/2017 0055   GLUCOSEU NEGATIVE 04/29/2017 0055   HGBUR NEGATIVE 04/29/2017 0055   BILIRUBINUR NEGATIVE 04/29/2017 0055   KETONESUR 80 (A) 04/29/2017 0055   PROTEINUR 30 (A) 04/29/2017 0055   NITRITE NEGATIVE 04/29/2017 0055   LEUKOCYTESUR NEGATIVE 04/29/2017 0055   Sepsis Labs: @LABRCNTIP (procalcitonin:4,lacticidven:4)   )No results found for this or any previous visit (from the past 240 hour(s)).    Radiology Studies: Dg Chest 2 View  Result Date: 04/29/2017 CLINICAL DATA:  Weakness, nausea vomiting cough for 3 days. EXAM: CHEST  2 VIEW COMPARISON:  09/23/2004 FINDINGS: Cardiomediastinal silhouette is normal. Mediastinal contours appear intact. Calcific atherosclerotic disease of the aorta. There is no evidence of focal airspace consolidation, pleural effusion or pneumothorax. Mild peribronchial prominence. Osseous structures are without acute abnormality. Soft tissues are grossly normal. IMPRESSION: Mild peribronchial prominence which may be seen with acute bronchitis or reactive airway disease. Electronically Signed   By: Fidela Salisbury M.D.   On: 04/29/2017 10:15   Ct Abdomen Pelvis W Contrast  Result Date: 04/29/2017 CLINICAL DATA:  Abdominal distention, nausea and vomiting. Lower abdominal pain. EXAM: CT ABDOMEN AND PELVIS WITH CONTRAST TECHNIQUE: Multidetector CT imaging of the abdomen and pelvis was performed using the standard protocol  following bolus administration of intravenous contrast. CONTRAST:  100 mL Isovue-300 COMPARISON:  08/21/2015 FINDINGS: Lower chest: Mild dependent atelectasis in the lung bases. Small esophageal hiatal hernia. Hepatobiliary: Surgical absence of the gallbladder. No bile duct dilatation. Mild diffuse fatty infiltration of the liver. Pancreas: Unremarkable. No pancreatic ductal dilatation or surrounding inflammatory changes. Spleen: Normal in size without focal abnormality. Adrenals/Urinary Tract: No adrenal gland nodules. Subcentimeter cysts in the kidneys. No hydronephrosis or hydroureter. Renal nephrograms are symmetrical. Bladder is unremarkable. Stomach/Bowel: Stomach, small bowel, and colon are mostly decompressed with scattered stool in the colon. Colonic diverticula without evidence of diverticulitis. Appendix is surgically absent. Vascular/Lymphatic: No significant vascular findings are present. No enlarged abdominal or pelvic lymph nodes. Reproductive: Diffuse nodular enlargement of the uterus consistent with multiple fibroids. Largest measures up to about 7.4 cm diameter. No abnormal adnexal masses. Other: No free air or free fluid in the abdomen. Abdominal wall musculature appears intact. Musculoskeletal: No acute or significant osseous findings. IMPRESSION: 1. Multiple uterine fibroids similar to previous study. 2.  Small esophageal hiatal hernia. 3. No evidence of bowel obstruction or inflammation. 4. Mild diffuse fatty infiltration of the liver. Electronically Signed   By: Lucienne Capers M.D.   On: 04/29/2017 03:37      Scheduled Meds: . amLODipine  5 mg Oral Daily  . aspirin EC  81 mg Oral Daily  . carvedilol  6.25 mg Oral QHS  . cholecalciferol  1,000 Units Oral Daily  . fluticasone  1 spray Each Nare Daily  . multivitamin with minerals  1 tablet Oral Daily  . omega-3 acid ethyl esters  1 g Oral BID  . potassium chloride SA  20 mEq Oral BID   Continuous Infusions:    LOS: 2 days     Time spent: 25 minutes  Greater than 50% of the time spent on counseling and coordinating the care.   Leisa Lenz, MD [facility] Pager [phone]  If 7PM-7AM, please contact night-coverage [email or web address] Password [email or web address] 05/02/2017, 9:07 AM

## 2017-05-03 DIAGNOSIS — R109 Unspecified abdominal pain: Secondary | ICD-10-CM

## 2017-05-03 DIAGNOSIS — E871 Hypo-osmolality and hyponatremia: Secondary | ICD-10-CM

## 2017-05-03 DIAGNOSIS — R1084 Generalized abdominal pain: Secondary | ICD-10-CM

## 2017-05-03 MED ORDER — SODIUM CHLORIDE 1 G PO TABS
1.0000 g | ORAL_TABLET | Freq: Three times a day (TID) | ORAL | Status: DC
  Administered 2017-05-04 (×3): 1 g via ORAL
  Filled 2017-05-03 (×5): qty 1

## 2017-05-03 NOTE — Progress Notes (Signed)
PROGRESS NOTE    Tina Ware  JHE:[id number] DOB: [birth year] DOA: 04/28/2017 PCP: No primary care provider on file.   Brief Narrative: Patient is a 77 year old female with past medical history of hypertension, arthritis, GERD who presented with persistent abdominal pain she had with nausea and vomiting.  Patient was admitted for the management of abdominal pain, nausea and vomiting most likely secondary to gastroparesis.  She was also found to be hyponatremic on presentation.  CT abdomen did not show any acute intra-abdominal abnormalities..  Assessment & Plan:   Active Problems:   Dehydration   Hyponatremia   Abdominal pain  Abdominal pain/nausea/vomiting: Suspected to be from gastroparesis.  Patient does not have any history of diabetes. CT scan of the abdomen did not show any acute intra-abdominal abnormalities. Abdominal pain has resolved.  Nausea and vomiting.  Currently she is tolerating the diet  Dehydration: Started on IV fluids.  Hyponatremia: Looks like this is a chronic issue.  Reviewing her labs on 08/21/15. her sodium level was 127. Sodium level fell down to 121 today.  Started on salt tablets and gentle IV fluids. Patient  also has edema of the right upper extremity which could be due to infiltration of the IV fluid.  Hypertension: Continue Coreg.  Also added Norvasc for better blood pressure control.  Blood pressure stable this morning   DVT prophylaxis: SCD Code Status: Full Family Communication: None present at the bedside Disposition Plan: Home in 1-2 days   Consultants: None  Procedures:None  Antimicrobials: None  Subjective: Patient seen and examined the bedside this morning.  Remains comfortable.  Was concerned about swelling of her right upper extremity. Abdomen pain, nausea and vomiting have resolved.  Objective: Vitals:   05/02/17 1349 05/02/17 2100 05/03/17 0514 05/03/17 1456  BP: (!) 161/91 (!) 154/86 (!) 143/79 (!) 152/69  Pulse: 84 (!)  106 94 96  Resp: 18 17 18 16   Temp: 98 F (36.7 C) 98.1 F (36.7 C) 98.3 F (36.8 C) 97.9 F (36.6 C)  TempSrc: Oral Oral Oral Oral  SpO2: 98% 99% 98% 98%  Weight:      Height:        Intake/Output Summary (Last 24 hours) at 05/03/2017 1608 Last data filed at 05/03/2017 1014 Gross per 24 hour  Intake 1145.83 ml  Output -  Net 1145.83 ml   Filed Weights   04/29/17 0058 04/29/17 0851  Weight: 82.6 kg (182 lb) 77.5 kg (170 lb 13.7 oz)    Examination:  General exam: Appears calm and comfortable ,Not in distress,average built Respiratory system: Bilateral equal air entry, normal vesicular breath sounds, no wheezes or crackles  Cardiovascular system: S1 & S2 heard, RRR. No JVD, murmurs, rubs, gallops or clicks. No pedal edema. Gastrointestinal system: Abdomen is nondistended, soft and nontender. No organomegaly or masses felt. Normal bowel sounds heard. Central nervous system: Alert and oriented. No focal neurological deficits. Extremities: Edema of the right upper extremity, no clubbing ,no cyanosis, distal peripheral pulses palpable. Skin: No rashes, lesions or ulcers,no icterus ,no pallor Psychiatry: Judgement and insight appear normal. Mood & affect appropriate.     Data Reviewed: I have personally reviewed following labs and imaging studies  CBC: Recent Labs  Lab 04/29/17 0055 05/01/17 0356 05/02/17 0418  WBC 4.1 8.4 5.4  HGB 16.9* 15.3* 16.1*  HCT 47.3* 42.4 44.0  MCV 82.4 81.5 82.1  PLT 147* 153 185*   Basic Metabolic Panel: Recent Labs  Lab 04/29/17 0055 04/30/17 0421 05/01/17 0356 05/02/17  0418  NA 125* 129* 125* 121*  K 3.6 3.6 3.4* 3.9  CL 89* 97* 95* 92*  CO2 26 25 24 22   GLUCOSE 122* 111* 125* 143*  BUN 6 11 11 9   CREATININE 0.67 0.62 0.53 0.53  CALCIUM 9.1 8.7* 8.4* 8.3*   GFR: Estimated Creatinine Clearance: 58.9 mL/min (by C-G formula based on SCr of 0.53 mg/dL). Liver Function Tests: Recent Labs  Lab 04/29/17 0055  AST 29  ALT 17    ALKPHOS 67  BILITOT 0.9  PROT 7.2  ALBUMIN 3.8   Recent Labs  Lab 04/29/17 0055  LIPASE 25   No results for input(s): AMMONIA in the last 168 hours. Coagulation Profile: No results for input(s): INR, PROTIME in the last 168 hours. Cardiac Enzymes: No results for input(s): CKTOTAL, CKMB, CKMBINDEX, TROPONINI in the last 168 hours. BNP (last 3 results) No results for input(s): PROBNP in the last 8760 hours. HbA1C: No results for input(s): HGBA1C in the last 72 hours. CBG: No results for input(s): GLUCAP in the last 168 hours. Lipid Profile: No results for input(s): CHOL, HDL, LDLCALC, TRIG, CHOLHDL, LDLDIRECT in the last 72 hours. Thyroid Function Tests: No results for input(s): TSH, T4TOTAL, FREET4, T3FREE, THYROIDAB in the last 72 hours. Anemia Panel: No results for input(s): VITAMINB12, FOLATE, FERRITIN, TIBC, IRON, RETICCTPCT in the last 72 hours. Sepsis Labs: Recent Labs  Lab 04/29/17 0514  LATICACIDVEN 0.83    No results found for this or any previous visit (from the past 240 hour(s)).       Radiology Studies: No results found.      Scheduled Meds: . amLODipine  5 mg Oral Daily  . aspirin EC  81 mg Oral Daily  . carvedilol  6.25 mg Oral QHS  . cholecalciferol  1,000 Units Oral Daily  . fluticasone  1 spray Each Nare Daily  . multivitamin with minerals  1 tablet Oral Daily  . omega-3 acid ethyl esters  1 g Oral BID  . potassium chloride SA  20 mEq Oral BID  . sodium chloride  1 g Oral TID WC   Continuous Infusions: . sodium chloride 75 mL (05/03/17 1022)     LOS: 3 days    Time spent:     Marene Lenz, MD [facility] Pager [phone]  If 7PM-7AM, please contact night-coverage [email or web address] Password [email or web address] 05/03/2017, 4:08 PM

## 2017-05-04 DIAGNOSIS — E860 Dehydration: Principal | ICD-10-CM

## 2017-05-04 DIAGNOSIS — R197 Diarrhea, unspecified: Secondary | ICD-10-CM

## 2017-05-04 LAB — BASIC METABOLIC PANEL
Anion gap: 5 (ref 5–15)
BUN: 6 mg/dL (ref 6–20)
CO2: 27 mmol/L (ref 22–32)
Calcium: 8.4 mg/dL — ABNORMAL LOW (ref 8.9–10.3)
Chloride: 99 mmol/L — ABNORMAL LOW (ref 101–111)
Creatinine, Ser: 0.52 mg/dL (ref 0.44–1.00)
GFR calc Af Amer: >60 mL/min (ref >60)
GFR calc non Af Amer: >60 mL/min (ref >60)
Glucose, Bld: 110 mg/dL — ABNORMAL HIGH (ref 65–99)
Potassium: 3.8 mmol/L (ref 3.5–5.1)
Sodium: 131 mmol/L — ABNORMAL LOW (ref 135–145)

## 2017-05-04 MED ORDER — LOSARTAN POTASSIUM 50 MG PO TABS
25.0000 mg | ORAL_TABLET | Freq: Every day | ORAL | Status: DC
  Administered 2017-05-04: 25 mg via ORAL
  Filled 2017-05-04: qty 1

## 2017-05-04 MED ORDER — LOSARTAN POTASSIUM 25 MG PO TABS: 25.0000 mg | ORAL_TABLET | Freq: Every day | ORAL | 0 refills | Status: DC

## 2017-05-04 MED ORDER — AMLODIPINE BESYLATE 5 MG PO TABS: 5.0000 mg | ORAL_TABLET | Freq: Every day | ORAL | 0 refills | Status: DC

## 2017-05-04 NOTE — Progress Notes (Signed)
CM consult for PCP. This CM spoke with pt about PCP. Pt states she sees Glendale Chard MD at [facility] and plans to follow up with her at discharge. Marney Doctor RN,BSN,NCM [phone]

## 2017-05-04 NOTE — Care Management Important Message (Signed)
Important Message  Patient Details  Name: Tina Ware MRN: [id number] Date of Birth: Jul 27, 1940   Medicare Important Message Given:  Yes    Kerin Salen 05/04/2017, 11:09 AMImportant Message  Patient Details  Name: Tina Ware MRN: [id number] Date of Birth: [birth year]   Medicare Important Message Given:  Yes    Kerin Salen 05/04/2017, 11:09 AM

## 2017-05-04 NOTE — Discharge Summary (Signed)
Physician Discharge Summary  Tina Ware JEH:[id number] DOB: [birth year] DOA: 04/28/2017  PCP: No primary care provider on file.  Admit date: 04/28/2017 Discharge date: 05/04/2017  Admitted From: Home Disposition:  Home  Discharge Condition:Stable CODE STATUS:Full Diet recommendation: Heart Healthy  Brief/Interim Summary: Patient is a 77 year old female with past medical history of hypertension, arthritis, GERD who presented with persistent abdominal pain she had with nausea and vomiting.  Patient was admitted for the management of abdominal pain, nausea and vomiting most likely secondary to gastroparesis.  She was also found to be hyponatremic on presentation.  CT abdomen did not show any acute intra-abdominal abnormalities. Patient was started on gentle IV fluids.  Her abdominal pain, nausea and vomiting stopped patient also developed severe hyponatremia during this hospitalization which responded to salt tablets and IV fluids. Sodium level today is 131.  She was on triamterene-hydrochlorothiazide at home which we will recommend to discontinue as it might be causing hyponatremia.  She was started on amlodipine and losartan for hypertension. Patient is stable to be discharged home today.   Following problems were addressed during hospitalization:  Abdominal pain/nausea/vomiting: Unknown etiology but suspected to be from gastroparesis .CT scan of the abdomen did not show any acute intra-abdominal abnormalities. Abdominal pain has resolved.  Nausea and vomiting.  Currently she is tolerating the diet  Dehydration: Resolved with  IV fluids.  Hyponatremia: Looks like this is a chronic issue.  Reviewing her labs on 08/21/15 herer sodium level was 127.  She was on triamterene-hydrochlorothiazide at home. Sodium level fell down to 121 but improved to 131 with salt tablets today.  Will recommend to check sodium level within a week when she follows with her primary care  physician.  Hypertension: Continue Coreg.  Also added Norvasc and Losartan.    Discharge Diagnoses:  Active Problems:   Dehydration   Hyponatremia   Abdominal pain    Discharge Instructions  Discharge Instructions    Diet - low sodium heart healthy   Complete by:  As directed    Discharge instructions   Complete by:  As directed    1) Monitor your blood pressure at home 2) Follow up with your PCP within a week.  Do BMP test to check for the sodium and potassium level when you visit your PCP. 3)We have changed your blood pressure medications .We would recommend to stop taking triamterene-hydrochlorothiazide.   Increase activity slowly   Complete by:  As directed      Allergies as of 05/04/2017   No Known Allergies     Medication List    STOP taking these medications   potassium chloride SA 20 MEQ tablet Commonly known as:  K-DUR,KLOR-CON   triamterene-hydrochlorothiazide 37.5-25 MG capsule Commonly known as:  DYAZIDE     TAKE these medications   amLODipine 5 MG tablet Commonly known as:  NORVASC Take 1 tablet (5 mg total) by mouth daily. Start taking on:  05/05/2017   aspirin EC 81 MG tablet Take 81 mg by mouth daily.   benzonatate 100 MG capsule Commonly known as:  TESSALON Take 100 mg by mouth 3 (three) times daily as needed for cough.   carvedilol 6.25 MG tablet Commonly known as:  COREG Take 6.25 mg by mouth at bedtime.   cholecalciferol 1000 units tablet Commonly known as:  VITAMIN D Take 1,000 Units by mouth daily.   furosemide 20 MG tablet Commonly known as:  LASIX Take 20 mg by mouth daily as needed for fluid.  hydrocortisone 25 MG suppository Commonly known as:  ANUSOL-HC Place 25 mg rectally 2 (two) times daily as needed for hemorrhoids or itching.   losartan 25 MG tablet Commonly known as:  COZAAR Take 1 tablet (25 mg total) by mouth daily.   mometasone 50 MCG/ACT nasal spray Commonly known as:  NASONEX Place 2 sprays into the nose  daily as needed (allergies).   multivitamin with minerals Tabs tablet Take 1 tablet by mouth daily.   Omega-3 1000 MG Caps Take 1,000 mg by mouth daily.   omeprazole 20 MG capsule Commonly known as:  PRILOSEC Take 1 capsule (20 mg total) by mouth daily.       No Known Allergies  Consultations:  None   Procedures/Studies: Dg Chest 2 View  Result Date: 04/29/2017 CLINICAL DATA:  Weakness, nausea vomiting cough for 3 days. EXAM: CHEST  2 VIEW COMPARISON:  09/23/2004 FINDINGS: Cardiomediastinal silhouette is normal. Mediastinal contours appear intact. Calcific atherosclerotic disease of the aorta. There is no evidence of focal airspace consolidation, pleural effusion or pneumothorax. Mild peribronchial prominence. Osseous structures are without acute abnormality. Soft tissues are grossly normal. IMPRESSION: Mild peribronchial prominence which may be seen with acute bronchitis or reactive airway disease. Electronically Signed   By: Fidela Salisbury M.D.   On: 04/29/2017 10:15   Ct Abdomen Pelvis W Contrast  Result Date: 04/29/2017 CLINICAL DATA:  Abdominal distention, nausea and vomiting. Lower abdominal pain. EXAM: CT ABDOMEN AND PELVIS WITH CONTRAST TECHNIQUE: Multidetector CT imaging of the abdomen and pelvis was performed using the standard protocol following bolus administration of intravenous contrast. CONTRAST:  100 mL Isovue-300 COMPARISON:  08/21/2015 FINDINGS: Lower chest: Mild dependent atelectasis in the lung bases. Small esophageal hiatal hernia. Hepatobiliary: Surgical absence of the gallbladder. No bile duct dilatation. Mild diffuse fatty infiltration of the liver. Pancreas: Unremarkable. No pancreatic ductal dilatation or surrounding inflammatory changes. Spleen: Normal in size without focal abnormality. Adrenals/Urinary Tract: No adrenal gland nodules. Subcentimeter cysts in the kidneys. No hydronephrosis or hydroureter. Renal nephrograms are symmetrical. Bladder is  unremarkable. Stomach/Bowel: Stomach, small bowel, and colon are mostly decompressed with scattered stool in the colon. Colonic diverticula without evidence of diverticulitis. Appendix is surgically absent. Vascular/Lymphatic: No significant vascular findings are present. No enlarged abdominal or pelvic lymph nodes. Reproductive: Diffuse nodular enlargement of the uterus consistent with multiple fibroids. Largest measures up to about 7.4 cm diameter. No abnormal adnexal masses. Other: No free air or free fluid in the abdomen. Abdominal wall musculature appears intact. Musculoskeletal: No acute or significant osseous findings. IMPRESSION: 1. Multiple uterine fibroids similar to previous study. 2. Small esophageal hiatal hernia. 3. No evidence of bowel obstruction or inflammation. 4. Mild diffuse fatty infiltration of the liver. Electronically Signed   By: Lucienne Capers M.D.   On: 04/29/2017 03:37       Subjective: Patient seen and examined the bedside this morning.  Remains comfortable.  Sodium level is improved.  Discussed discharge planning.   Discharge Exam: Vitals:   05/04/17 0508 05/04/17 1000  BP: (!) 188/104 (!) 164/64  Pulse: (!) 107 (!) 106  Resp: 12 12  Temp: 97.7 F (36.5 C) 97.7 F (36.5 C)  SpO2: 95% 97%   Vitals:   05/03/17 1456 05/03/17 1900 05/04/17 0508 05/04/17 1000  BP: (!) 152/69 (!) 175/96 (!) 188/104 (!) 164/64  Pulse: 96 (!) 116 (!) 107 (!) 106  Resp: 16 14 12 12   Temp: 97.9 F (36.6 C) 97.7 F (36.5 C) 97.7 F (36.5 C)  97.7 F (36.5 C)  TempSrc: Oral Oral Oral Oral  SpO2: 98% 100% 95% 97%  Weight:      Height:        General: Pt is alert, awake, not in acute distress Cardiovascular: RRR, S1/S2 +, no rubs, no gallops Respiratory: CTA bilaterally, no wheezing, no rhonchi Abdominal: Soft, NT, ND, bowel sounds + Extremities: no edema, no cyanosis    The results of significant diagnostics from this hospitalization (including imaging, microbiology,  ancillary and laboratory) are listed below for reference.     Microbiology: No results found for this or any previous visit (from the past 240 hour(s)).   Labs: BNP (last 3 results) No results for input(s): BNP in the last 8760 hours. Basic Metabolic Panel: Recent Labs  Lab 04/29/17 0055 04/30/17 0421 05/01/17 0356 05/02/17 0418 05/04/17 0347  NA 125* 129* 125* 121* 131*  K 3.6 3.6 3.4* 3.9 3.8  CL 89* 97* 95* 92* 99*  CO2 26 25 24 22 27   GLUCOSE 122* 111* 125* 143* 110*  BUN 6 11 11 9 6   CREATININE 0.67 0.62 0.53 0.53 0.52  CALCIUM 9.1 8.7* 8.4* 8.3* 8.4*   Liver Function Tests: Recent Labs  Lab 04/29/17 0055  AST 29  ALT 17  ALKPHOS 67  BILITOT 0.9  PROT 7.2  ALBUMIN 3.8   Recent Labs  Lab 04/29/17 0055  LIPASE 25   No results for input(s): AMMONIA in the last 168 hours. CBC: Recent Labs  Lab 04/29/17 0055 05/01/17 0356 05/02/17 0418  WBC 4.1 8.4 5.4  HGB 16.9* 15.3* 16.1*  HCT 47.3* 42.4 44.0  MCV 82.4 81.5 82.1  PLT 147* 153 138*   Cardiac Enzymes: No results for input(s): CKTOTAL, CKMB, CKMBINDEX, TROPONINI in the last 168 hours. BNP: Invalid input(s): POCBNP CBG: No results for input(s): GLUCAP in the last 168 hours. D-Dimer No results for input(s): DDIMER in the last 72 hours. Hgb A1c No results for input(s): HGBA1C in the last 72 hours. Lipid Profile No results for input(s): CHOL, HDL, LDLCALC, TRIG, CHOLHDL, LDLDIRECT in the last 72 hours. Thyroid function studies No results for input(s): TSH, T4TOTAL, T3FREE, THYROIDAB in the last 72 hours.  Invalid input(s): FREET3 Anemia work up No results for input(s): VITAMINB12, FOLATE, FERRITIN, TIBC, IRON, RETICCTPCT in the last 72 hours. Urinalysis    Component Value Date/Time   COLORURINE YELLOW 04/29/2017 0055   APPEARANCEUR CLEAR 04/29/2017 0055   LABSPEC 1.016 04/29/2017 0055   PHURINE 7.0 04/29/2017 0055   GLUCOSEU NEGATIVE 04/29/2017 0055   HGBUR NEGATIVE 04/29/2017 0055    BILIRUBINUR NEGATIVE 04/29/2017 0055   KETONESUR 80 (A) 04/29/2017 0055   PROTEINUR 30 (A) 04/29/2017 0055   NITRITE NEGATIVE 04/29/2017 0055   LEUKOCYTESUR NEGATIVE 04/29/2017 0055   Sepsis Labs Invalid input(s): PROCALCITONIN,  WBC,  LACTICIDVEN Microbiology No results found for this or any previous visit (from the past 240 hour(s)).   Time coordinating discharge: Over 30 minutes  SIGNED:   Marene Lenz, MD  [facility] 05/04/2017, 12:41 PM Pager [phone]  If 7PM-7AM, please contact night-coverage [email or web address] Password [email or web address]

## 2017-05-19 IMAGING — CT CT ABD-PELV W/ CM
2 of 4 series · 16 of 46 positions shown, 18 images · IV contrast (iopamidol)
Comparison: Abdominal ultrasound performed 07/30/2004

CLINICAL DATA: Acute onset of generalized abdominal pain and
burning. Vomiting and diarrhea. Initial encounter.

EXAM:
CT ABDOMEN AND PELVIS WITH CONTRAST
TECHNIQUE: Multidetector CT imaging of the abdomen and pelvis was performed
using the standard protocol following bolus administration of
intravenous contrast.
CONTRAST:  100mL YZTBLC-SKK IOPAMIDOL (YZTBLC-SKK) INJECTION 61%

[Series 2: abd/pel with · axial · 0.72mm/px · z∈[-686,-330]mm · 13 of 79 slices shown, 15 images]
[im 4/79  soft-tissue]
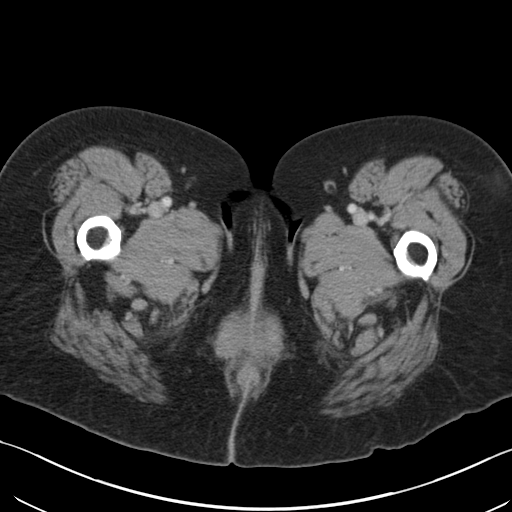
[im 4/79  bone]
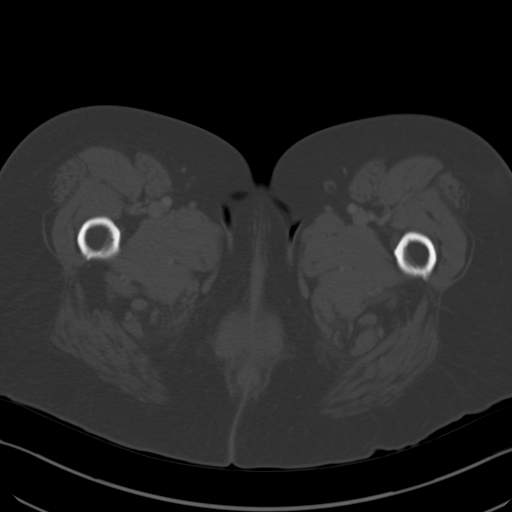
[im 11/79  soft-tissue]
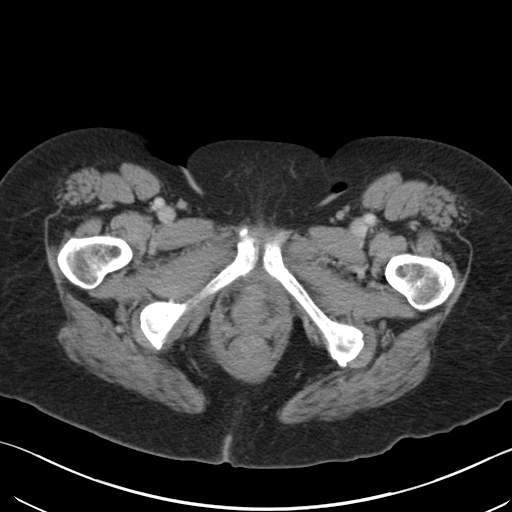
[im 17/79  soft-tissue]
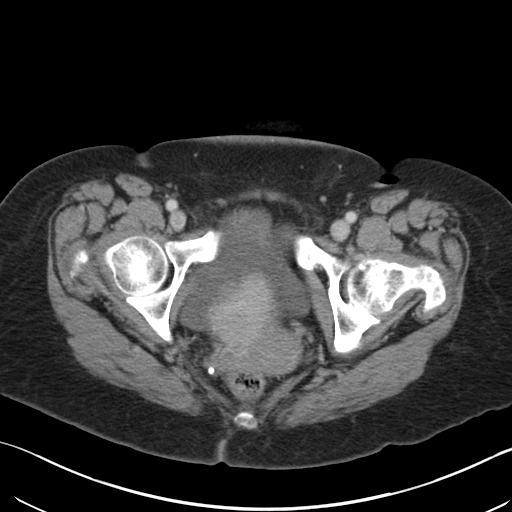
[im 21/79  soft-tissue]
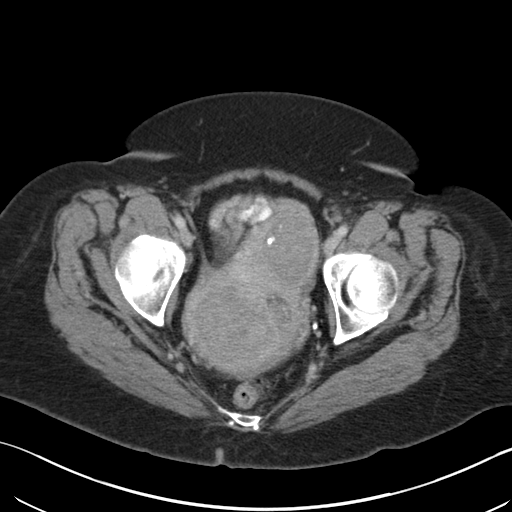
[im 28/79  soft-tissue]
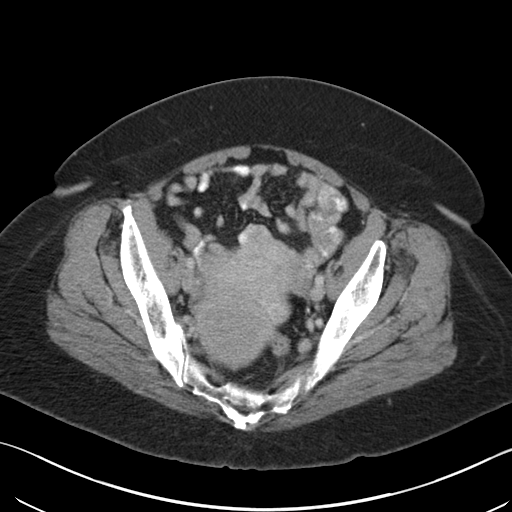
[im 34/79  soft-tissue]
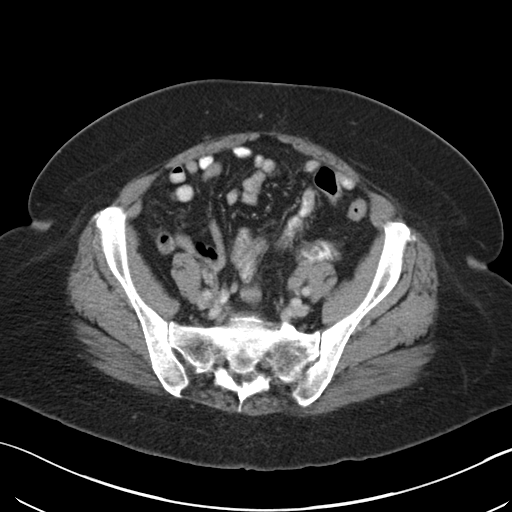
[im 41/79  soft-tissue]
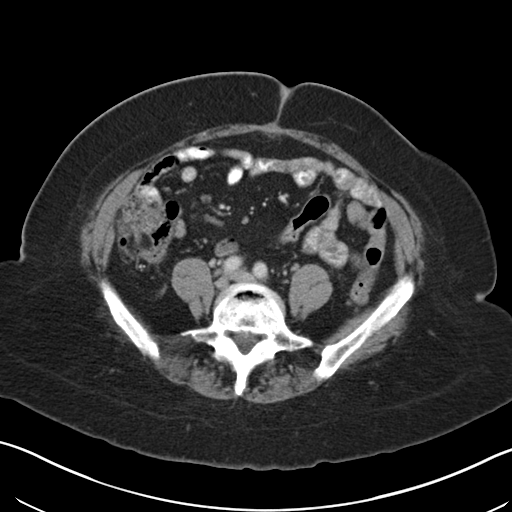
[im 45/79  soft-tissue]
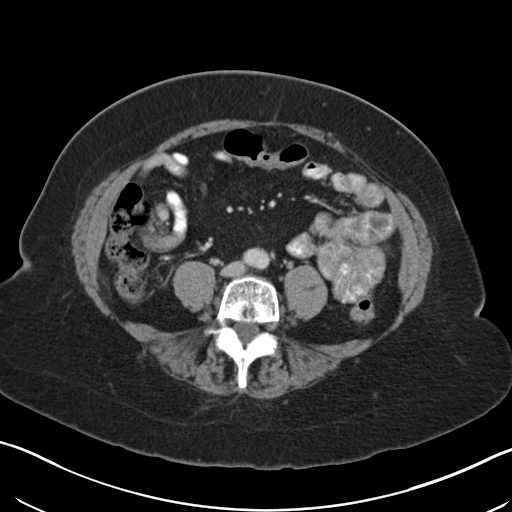
[im 51/79  soft-tissue]
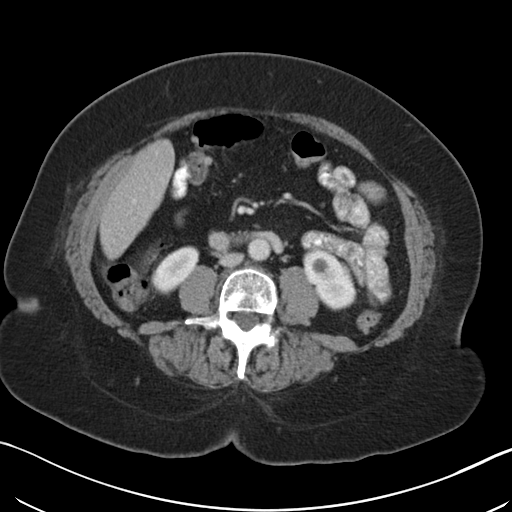
[im 51/79  bone]
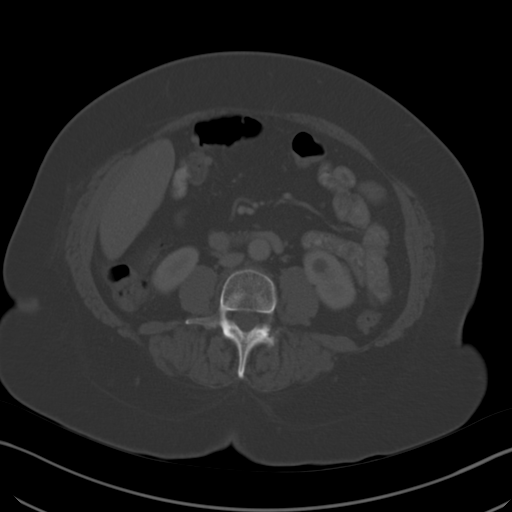
[im 58/79  soft-tissue]
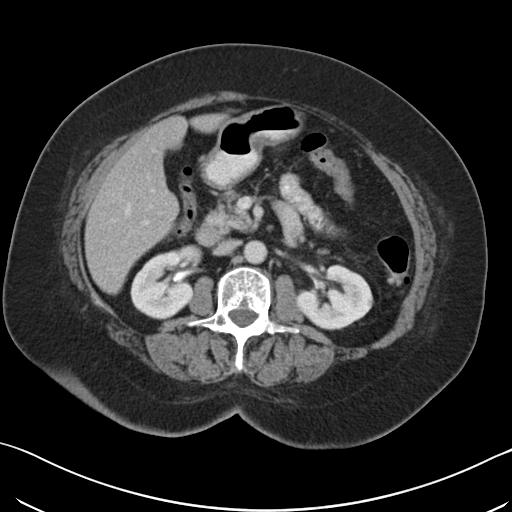
[im 62/79  soft-tissue]
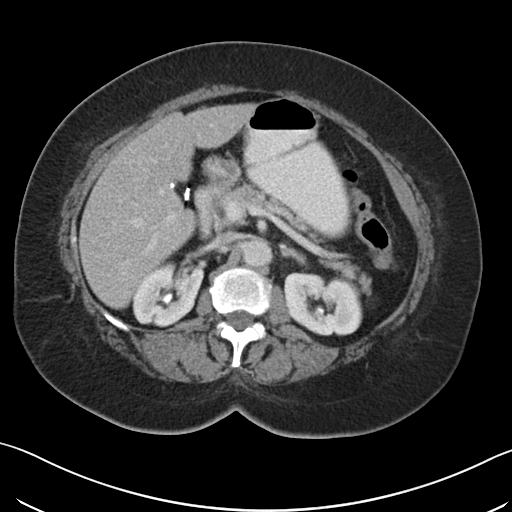
[im 68/79  soft-tissue]
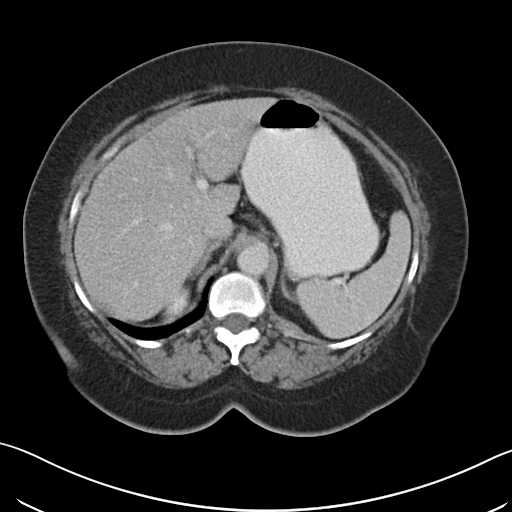
[im 75/79  soft-tissue]
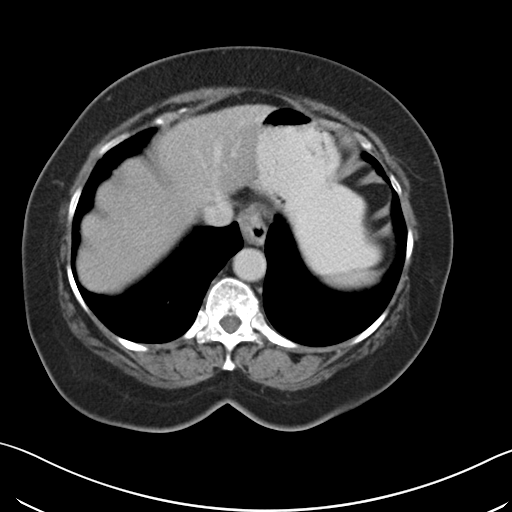

[Series 5: coronal a/|p · coronal · 0.66mm/px · 3 of 154 slices shown]
[im 52/154  soft-tissue]
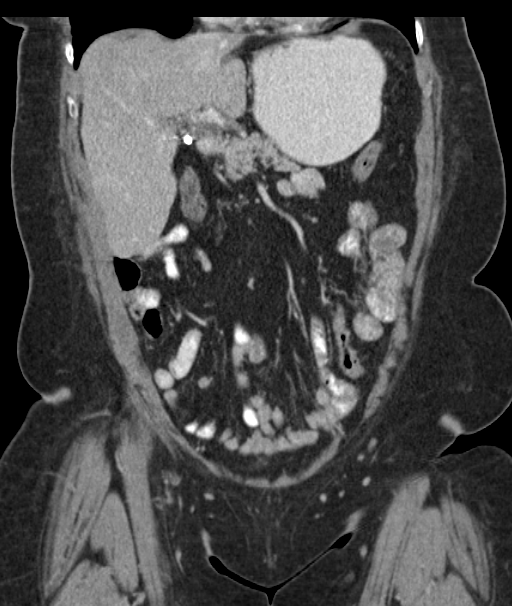
[im 69/154  soft-tissue]
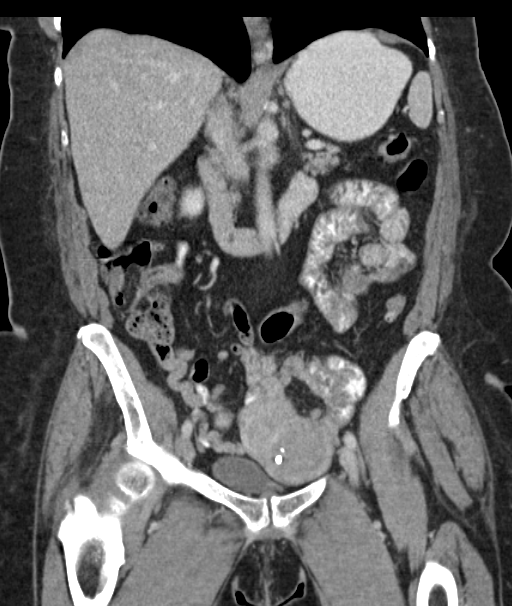
[im 86/154  soft-tissue]
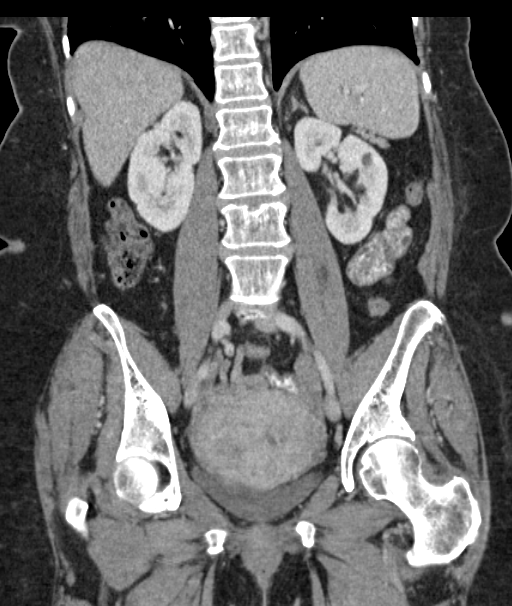

[16 of 46 positions shown; findings below may reference images not displayed]

FINDINGS: The visualized lung bases are clear.

The liver and spleen are unremarkable in appearance. The patient is
status post cholecystectomy, with clips noted at the gallbladder
fossa. The pancreas and adrenal glands are unremarkable.

Minimal right-sided renal scarring is noted. Scattered small left
renal cysts are seen. There is no evidence of hydronephrosis. No
renal or ureteral stones are seen. No perinephric stranding is
appreciated.

No free fluid is identified. The small bowel is unremarkable in
appearance. The stomach is within normal limits. No acute vascular
abnormalities are seen.

The appendix is normal in caliber, without evidence of appendicitis.
Minimal diverticulosis is noted along the hepatic flexure of the
colon, and along the descending and proximal sigmoid colon, without
evidence of diverticulitis.

The bladder is mildly distended and grossly unremarkable in
appearance. Multiple uterine fibroids are seen, measuring up to
cm. No suspicious adnexal masses are seen. The ovaries are
relatively symmetric. No inguinal lymphadenopathy is seen.

No acute osseous abnormalities are identified. Vacuum phenomenon is
noted at L4-L5.
IMPRESSION: 1. No acute abnormality seen to explain the patient's symptoms.
2. Multiple large uterine fibroids seen, measuring up to 6.5 cm.
3. Minimal diverticulosis along the hepatic flexure of the colon,
and along the descending and proximal sigmoid colon, without
evidence of diverticulitis.
4. Small left renal cysts seen. Minimal right-sided renal scarring
noted.

## 2017-12-18 DIAGNOSIS — I129 Hypertensive chronic kidney disease with stage 1 through stage 4 chronic kidney disease, or unspecified chronic kidney disease: Secondary | ICD-10-CM

## 2017-12-18 DIAGNOSIS — N182 Chronic kidney disease, stage 2 (mild): Secondary | ICD-10-CM | POA: Diagnosis not present

## 2017-12-18 DIAGNOSIS — R609 Edema, unspecified: Secondary | ICD-10-CM | POA: Diagnosis not present

## 2018-01-15 ENCOUNTER — Encounter: Payer: Self-pay | Admitting: Nurse Practitioner

## 2018-01-15 ENCOUNTER — Ambulatory Visit: Payer: Medicare Other | Admitting: Nurse Practitioner

## 2018-01-15 VITALS — BP 160/100 | HR 67 | Temp 97.6°F | Ht 63.0 in | Wt 176.6 lb

## 2018-01-15 DIAGNOSIS — R600 Localized edema: Secondary | ICD-10-CM

## 2018-01-15 DIAGNOSIS — K649 Unspecified hemorrhoids: Secondary | ICD-10-CM | POA: Diagnosis not present

## 2018-01-15 DIAGNOSIS — I10 Essential (primary) hypertension: Secondary | ICD-10-CM | POA: Diagnosis not present

## 2018-01-15 DIAGNOSIS — Z23 Encounter for immunization: Secondary | ICD-10-CM

## 2018-01-15 DIAGNOSIS — M8580 Other specified disorders of bone density and structure, unspecified site: Secondary | ICD-10-CM

## 2018-01-15 DIAGNOSIS — M069 Rheumatoid arthritis, unspecified: Secondary | ICD-10-CM

## 2018-01-15 MED ORDER — TRIAMTERENE-HCTZ 37.5-25 MG PO TABS: 1.0000 | ORAL_TABLET | Freq: Every day | ORAL | 1 refills | Status: DC

## 2018-01-15 MED ORDER — HYDROCORTISONE ACE-PRAMOXINE 2.5-1 % EX CREA: 1.0000 | TOPICAL_CREAM | Freq: Three times a day (TID) | CUTANEOUS | 3 refills | Status: DC | PRN

## 2018-01-15 NOTE — Progress Notes (Signed)
Subjective:     [name] , female    DOB: [birth year] , 77 y.o.   MRN: [id number]   Hypertension  This is a chronic problem. The current episode started more than 1 year ago. The problem has been gradually worsening since onset. The problem is uncontrolled. Pertinent negatives include no blurred vision or malaise/fatigue.     Past Medical History:  Diagnosis Date  . Arthritis    RA  . CAD (coronary artery disease)   . Fibroid, uterine   . Hypertension       Current Outpatient Medications:  .  aspirin EC 81 MG tablet, Take 81 mg by mouth daily., Disp: , Rfl:  .  benzonatate (TESSALON) 100 MG capsule, Take 100 mg by mouth 3 (three) times daily as needed for cough., Disp: , Rfl:  .  carvedilol (COREG) 6.25 MG tablet, Take 6.25 mg by mouth at bedtime. , Disp: , Rfl:  .  cholecalciferol (VITAMIN D) 1000 units tablet, Take 1,000 Units by mouth daily., Disp: , Rfl:  .  furosemide (LASIX) 20 MG tablet, Take 20 mg by mouth daily as needed for fluid. , Disp: , Rfl:  .  hydrocortisone (ANUSOL-HC) 25 MG suppository, Place 25 mg rectally 2 (two) times daily as needed for hemorrhoids or itching., Disp: , Rfl:  .  losartan (COZAAR) 25 MG tablet, Take 1 tablet (25 mg total) by mouth daily., Disp: 30 tablet, Rfl: 0 .  mometasone (NASONEX) 50 MCG/ACT nasal spray, Place 2 sprays into the nose daily as needed (allergies)., Disp: , Rfl:  .  Multiple Vitamin (MULTIVITAMIN WITH MINERALS) TABS tablet, Take 1 tablet by mouth daily., Disp: , Rfl:  .  Omega-3 1000 MG CAPS, Take 1,000 mg by mouth daily., Disp: , Rfl:  .  amLODipine (NORVASC) 5 MG tablet, Take 1 tablet (5 mg total) by mouth daily. (Patient not taking: Reported on 01/15/2018), Disp: 30 tablet, Rfl: 0 .  omeprazole (PRILOSEC) 20 MG capsule, Take 1 capsule (20 mg total) by mouth daily. (Patient not taking: Reported on 01/15/2018), Disp: 30 capsule, Rfl: 0   No Known Allergies   Review of Systems  Constitutional: Negative.   Negative for malaise/fatigue.  Eyes: Negative for blurred vision.  Respiratory: Negative.   Cardiovascular: Positive for leg swelling.  Skin: Negative.   Neurological: Negative.      Today's Vitals   01/15/18 1413  BP: (!) 160/100  Pulse: 67  Temp: 97.6 F (36.4 C)  TempSrc: Oral  SpO2: 97%  Weight: 176 lb 9.6 oz (80.1 kg)  Height: 5' 3" (1.6 m)  PainSc: 0-No pain   Body mass index is 31.28 kg/m.   Objective:  Physical Exam  Constitutional: She is oriented to person, place, and time. She appears well-developed and well-nourished.  Cardiovascular: Normal rate, regular rhythm and normal heart sounds.  Pulmonary/Chest: Effort normal and breath sounds normal.  Musculoskeletal: Normal range of motion.  Neurological: She is alert and oriented to person, place, and time.  Skin: Skin is warm and dry. Capillary refill takes less than 2 seconds.        Assessment And Plan:   1. Essential hypertension  Chronic, poorly controlled, elevated today. I will restart her triamterene and have advised her to ensure she is staying well hydrated  Continue with current medications  - BMP8+eGFR - triamterene-hydrochlorothiazide (MAXZIDE-25) 37.5-25 MG tablet; Take 1 tablet by mouth daily.  Dispense: 30 tablet; Refill: 1  2. Need for influenza vaccination  Influenza  vaccine administered  Encouraged to take Tylenol as needed for fever or muscle aches. - Flu vaccine HIGH DOSE PF (Fluzone High dose)  3. Localized edema  Continues to have swelling to bilateral lower extremity - triamterene-hydrochlorothiazide (MAXZIDE-25) 37.5-25 MG tablet; Take 1 tablet by mouth daily.  Dispense: 30 tablet; Refill: 1  4. Hemorrhoids, unspecified hemorrhoid type Try suppoository - Pramoxine-HC (HYDROCORTISONE ACE-PRAMOXINE) 2.5-1 % CREA; Apply 1 Syringe topically 3 (three) times daily as needed.  Dispense: 1 Tube; Refill: 3  5. Osteopenia, unspecified location  Will check bone density has not been  done in more than 2 yeares - DG Bone Density; Future  6. Rheumatoid arthritis involving both hands, unspecified rheumatoid factor presence ([place])  Will also check bone density due to this chronic problem as well - DG Bone Density; Future         Minette Brine, FNP

## 2018-01-15 NOTE — Patient Instructions (Signed)
   Take furosemide only as needed  Will restart Triamterene/HCTZ  Continue with carvedilol twice a day and losartan 50mg  daily.   Continue to keep your feet up when sitting for long periods.

## 2018-01-16 LAB — BMP8+EGFR
BUN/Creatinine Ratio: 10 — ABNORMAL LOW (ref 12–28)
BUN: 7 mg/dL — ABNORMAL LOW (ref 8–27)
CO2: 25 mmol/L (ref 20–29)
Calcium: 9.3 mg/dL (ref 8.7–10.3)
Chloride: 101 mmol/L (ref 96–106)
Creatinine, Ser: 0.67 mg/dL (ref 0.57–1.00)
GFR calc Af Amer: 99 mL/min/{1.73_m2} (ref >59)
GFR calc non Af Amer: 86 mL/min/{1.73_m2} (ref >59)
Glucose: 96 mg/dL (ref 65–99)
Potassium: 4.2 mmol/L (ref 3.5–5.2)
Sodium: 138 mmol/L (ref 134–144)

## 2018-02-01 ENCOUNTER — Telehealth: Payer: Self-pay

## 2018-02-01 NOTE — Telephone Encounter (Signed)
Notified pt her labs were sent to her on the portal but I went ahead and advised her of them as well. YRL,RMA

## 2018-02-01 NOTE — Telephone Encounter (Signed)
Patient called stating she has not received lab results from her last visit with [place].   Returned pt call to advise her we did not do labs at her last visit she is supposed to come back in 4 weeks from then to be seen and get labs done. Left v/m to call office. YRL,RMA

## 2018-02-12 ENCOUNTER — Encounter: Payer: Self-pay | Admitting: Nurse Practitioner

## 2018-02-12 ENCOUNTER — Other Ambulatory Visit: Payer: Self-pay | Admitting: Nurse Practitioner

## 2018-02-12 ENCOUNTER — Ambulatory Visit: Payer: Medicare Other | Admitting: Nurse Practitioner

## 2018-02-12 VITALS — BP 130/80 | HR 69 | Temp 97.5°F | Ht 63.0 in | Wt 170.2 lb

## 2018-02-12 DIAGNOSIS — R600 Localized edema: Secondary | ICD-10-CM | POA: Diagnosis not present

## 2018-02-12 DIAGNOSIS — M25561 Pain in right knee: Secondary | ICD-10-CM | POA: Diagnosis not present

## 2018-02-12 DIAGNOSIS — I10 Essential (primary) hypertension: Secondary | ICD-10-CM | POA: Insufficient documentation

## 2018-02-12 DIAGNOSIS — G8929 Other chronic pain: Secondary | ICD-10-CM | POA: Diagnosis not present

## 2018-02-12 NOTE — Progress Notes (Signed)
Subjective:     [name] , female    DOB: 1940/12/27 , 77 y.o.   MRN: [id number]   Chief Complaint  Patient presents with  . Hypertension  . Foot Swelling    right foot has still been swelling a lot more than left one    HPI  Left lower foot continues to swell, slightly better.  Yesterday her lower extremity was better however continues to have to right knee.   Knee Pain   The incident occurred more than 1 week ago. There was no injury mechanism. The pain is present in the right knee. The quality of the pain is described as aching. The pain is moderate. The pain has been constant since onset. Pertinent negatives include no inability to bear weight, loss of motion, numbness or tingling. She has tried nothing for the symptoms. The treatment provided no relief.  Hypertension  This is a chronic problem. The current episode started more than 1 year ago. The problem has been gradually worsening since onset. The problem is uncontrolled. Pertinent negatives include no chest pain, palpitations or shortness of breath. (She had to urinate when she first came in to the office.) Risk factors for coronary artery disease include obesity and sedentary lifestyle.     Past Medical History:  Diagnosis Date  . Arthritis    RA  . CAD (coronary artery disease)   . Fibroid, uterine   . Hypertension      Family History  Problem Relation Age of Onset  . Heart disease Mother      Current Outpatient Medications:  .  aspirin EC 81 MG tablet, Take 81 mg by mouth daily., Disp: , Rfl:  .  benzonatate (TESSALON) 100 MG capsule, Take 100 mg by mouth 3 (three) times daily as needed for cough., Disp: , Rfl:  .  carvedilol (COREG) 6.25 MG tablet, Take 6.25 mg by mouth at bedtime. , Disp: , Rfl:  .  cholecalciferol (VITAMIN D) 1000 units tablet, Take 1,000 Units by mouth daily., Disp: , Rfl:  .  furosemide (LASIX) 20 MG tablet, TAKE 1 TABLET BY MOUTH EVERY DAY AS NEEDED, Disp: 90 tablet,  Rfl: 1 .  hydrocortisone (ANUSOL-HC) 25 MG suppository, Place 25 mg rectally 2 (two) times daily as needed for hemorrhoids or itching., Disp: , Rfl:  .  losartan (COZAAR) 25 MG tablet, Take 1 tablet (25 mg total) by mouth daily., Disp: 30 tablet, Rfl: 0 .  mometasone (NASONEX) 50 MCG/ACT nasal spray, Place 2 sprays into the nose daily as needed (allergies)., Disp: , Rfl:  .  Multiple Vitamin (MULTIVITAMIN WITH MINERALS) TABS tablet, Take 1 tablet by mouth daily., Disp: , Rfl:  .  Omega-3 1000 MG CAPS, Take 1,000 mg by mouth daily., Disp: , Rfl:  .  Pramoxine-HC (HYDROCORTISONE ACE-PRAMOXINE) 2.5-1 % CREA, Apply 1 Syringe topically 3 (three) times daily as needed., Disp: 1 Tube, Rfl: 3 .  triamterene-hydrochlorothiazide (MAXZIDE-25) 37.5-25 MG tablet, Take 1 tablet by mouth daily., Disp: 30 tablet, Rfl: 1 .  omeprazole (PRILOSEC) 20 MG capsule, Take 1 capsule (20 mg total) by mouth daily. (Patient not taking: Reported on 02/12/2018), Disp: 30 capsule, Rfl: 0   No Known Allergies   Review of Systems  Constitutional: Negative.  Negative for fatigue.  Respiratory: Negative.  Negative for shortness of breath.   Cardiovascular: Positive for leg swelling (right greater than left). Negative for chest pain and palpitations.  Gastrointestinal: Negative.   Endocrine: Negative.  Negative for  polydipsia, polyphagia and polyuria.  Musculoskeletal: Positive for arthralgias (right knee).  Skin: Negative.   Neurological: Negative.  Negative for dizziness, tingling and numbness.     Today's Vitals   02/12/18 1411 02/12/18 1439  BP: (!) 160/92 130/80  Pulse: 69   Temp: (!) 97.5 F (36.4 C)   TempSrc: Oral   Weight: 170 lb 3.2 oz (77.2 kg)   Height: 5\' 3"  (1.6 m)   PainSc: 0-No pain    Body mass index is 30.15 kg/m.   Objective:  Physical Exam  Constitutional: She is oriented to person, place, and time. She appears well-developed and well-nourished.  Cardiovascular: Normal rate, regular rhythm,  S1 normal, S2 normal and normal heart sounds.  No murmur heard. Pulses:      Posterior tibial pulses are 1+ on the right side, and 1+ on the left side.  Pulmonary/Chest: Effort normal and breath sounds normal. She exhibits no tenderness.  Musculoskeletal: She exhibits edema (right lower extremity worse than right).  Neurological: She is alert and oriented to person, place, and time.  Skin: Skin is warm and dry. Capillary refill takes less than 2 seconds.  Vitals reviewed.       Assessment And Plan:     1. Chronic pain of right knee  Persistent right knee pain  Tender to palpation to anterior surface  I will refer to orthopedics for further evaluation this could be causing her swelling to her lower extremity worse on the right - Ambulatory referral to Orthopedic Surgery  2. Localized edema  Right is greater than left 2+  Discussed importance of wearing shoes with good arch support.    3. Essential hypertension  Chronic, poorly controlled today  Repeat is better at 130/80  Continue with current medications     Minette Brine, FNP

## 2018-03-03 ENCOUNTER — Other Ambulatory Visit: Payer: Self-pay | Admitting: Nurse Practitioner

## 2018-03-03 DIAGNOSIS — R600 Localized edema: Secondary | ICD-10-CM

## 2018-03-03 DIAGNOSIS — I10 Essential (primary) hypertension: Secondary | ICD-10-CM

## 2018-03-14 ENCOUNTER — Ambulatory Visit: Payer: Medicare Other

## 2018-03-14 VITALS — BP 142/82 | HR 80 | Temp 98.7°F | Ht 61.8 in | Wt 172.0 lb

## 2018-03-14 DIAGNOSIS — Z23 Encounter for immunization: Secondary | ICD-10-CM

## 2018-03-14 DIAGNOSIS — E1165 Type 2 diabetes mellitus with hyperglycemia: Secondary | ICD-10-CM | POA: Diagnosis not present

## 2018-03-14 DIAGNOSIS — Z1211 Encounter for screening for malignant neoplasm of colon: Secondary | ICD-10-CM | POA: Diagnosis not present

## 2018-03-14 DIAGNOSIS — Z0000 Encounter for general adult medical examination without abnormal findings: Secondary | ICD-10-CM

## 2018-03-14 LAB — POCT URINALYSIS DIPSTICK
Bilirubin, UA: NEGATIVE
Blood, UA: NEGATIVE
Glucose, UA: NEGATIVE
Ketones, UA: NEGATIVE
Leukocytes, UA: NEGATIVE
Nitrite, UA: NEGATIVE
Protein, UA: NEGATIVE
Spec Grav, UA: <=1.005 — AB (ref 1.010–1.025)
Urobilinogen, UA: 0.2 E.U./dL
pH, UA: 5.5 (ref 5.0–8.0)

## 2018-03-14 LAB — POCT UA - MICROALBUMIN
Albumin/Creatinine Ratio, Urine, POC: <30
Creatinine, POC: 50 mg/dL
Microalbumin Ur, POC: 10 mg/L

## 2018-03-14 NOTE — Progress Notes (Signed)
Subjective:   Tina Ware is a 77 y.o. female who presents for Medicare Annual (Subsequent) preventive examination.  Review of Systems:  n/a Cardiac Risk Factors include: advanced age (>37men, >16 women);obesity (BMI >30kg/m2);sedentary lifestyle     Objective:     Vitals: BP (!) 142/82 (BP Location: Left Arm, Patient Position: Sitting)   Pulse 80   Temp 98.7 F (37.1 C) (Oral)   Ht 5' 1.8" (1.57 m)   Wt 172 lb (78 kg)   SpO2 96%   BMI 31.66 kg/m   Body mass index is 31.66 kg/m.  Advanced Directives 03/14/2018 04/29/2017 04/29/2017 08/21/2015  Does Patient Have a Medical Advance Directive? No No No No  Would patient like information on creating a medical advance directive? Yes (MAU/Ambulatory/Procedural Areas - Information given) No - Patient declined No - Patient declined No - patient declined information    Tobacco Social History   Tobacco Use  Smoking Status Never Smoker  Smokeless Tobacco Never Used     Counseling given: Not Answered   Clinical Intake:  Pre-visit preparation completed: Yes  Pain : No/denies pain Pain Score: 0-No pain     Nutritional Status: BMI 25 -29 Overweight Diabetes: Yes CBG done?: No Did pt. bring in CBG monitor from home?: No  How often do you need to have someone help you when you read instructions, pamphlets, or other written materials from your doctor or pharmacy?: 1 - Never What is the last grade level you completed in school?: college  Interpreter Needed?: No  Information entered by :: NAllen LPN  Past Medical History:  Diagnosis Date  . Arthritis    RA  . CAD (coronary artery disease)   . Fibroid, uterine   . Hypertension    Past Surgical History:  Procedure Laterality Date  . CHOLECYSTECTOMY     Family History  Problem Relation Age of Onset  . Heart disease Mother    Social History   Socioeconomic History  . Marital status: Single    Spouse name: Not on file  . Number of children: Not on file  .  Years of education: Not on file  . Highest education level: Not on file  Occupational History  . Not on file  Social Needs  . Financial resource strain: Not hard at all  . Food insecurity:    Worry: Never true    Inability: Never true  . Transportation needs:    Medical: No    Non-medical: No  Tobacco Use  . Smoking status: Never Smoker  . Smokeless tobacco: Never Used  Substance and Sexual Activity  . Alcohol use: No    Alcohol/week: 0.0 standard drinks  . Drug use: No  . Sexual activity: Not Currently  Lifestyle  . Physical activity:    Days per week: 0 days    Minutes per session: 0 min  . Stress: Not at all  Relationships  . Social connections:    Talks on phone: Not on file    Gets together: Not on file    Attends religious service: Not on file    Active member of club or organization: Not on file    Attends meetings of clubs or organizations: Not on file    Relationship status: Not on file  Other Topics Concern  . Not on file  Social History Narrative  . Not on file    Outpatient Encounter Medications as of 03/14/2018  Medication Sig  . aspirin EC 81 MG tablet Take 81  mg by mouth daily.  . benzonatate (TESSALON) 100 MG capsule Take 100 mg by mouth 3 (three) times daily as needed for cough.  . carvedilol (COREG) 6.25 MG tablet Take 6.25 mg by mouth at bedtime.   . cholecalciferol (VITAMIN D) 1000 units tablet Take 1,000 Units by mouth daily.  . furosemide (LASIX) 20 MG tablet TAKE 1 TABLET BY MOUTH EVERY DAY AS NEEDED  . hydrocortisone (ANUSOL-HC) 25 MG suppository Place 25 mg rectally 2 (two) times daily as needed for hemorrhoids or itching.  . losartan (COZAAR) 25 MG tablet Take 1 tablet (25 mg total) by mouth daily.  . mometasone (NASONEX) 50 MCG/ACT nasal spray Place 2 sprays into the nose daily as needed (allergies).  . Multiple Vitamin (MULTIVITAMIN WITH MINERALS) TABS tablet Take 1 tablet by mouth daily.  . Omega-3 1000 MG CAPS Take 1,000 mg by mouth  daily.  . Pramoxine-HC (HYDROCORTISONE ACE-PRAMOXINE) 2.5-1 % CREA Apply 1 Syringe topically 3 (three) times daily as needed.  . triamterene-hydrochlorothiazide (MAXZIDE-25) 37.5-25 MG tablet TAKE 1 TABLET BY MOUTH EVERY DAY  . omeprazole (PRILOSEC) 20 MG capsule Take 1 capsule (20 mg total) by mouth daily. (Patient not taking: Reported on 02/12/2018)   No facility-administered encounter medications on file as of 03/14/2018.     Activities of Daily Living In your present state of health, do you have any difficulty performing the following activities: 03/14/2018 04/29/2017  Hearing? N N  Vision? N Y  Difficulty concentrating or making decisions? N N  Walking or climbing stairs? N N  Dressing or bathing? N N  Doing errands, shopping? N N  Preparing Food and eating ? N -  Using the Toilet? N -  In the past six months, have you accidently leaked urine? N -  Do you have problems with loss of bowel control? N -  Managing your Medications? N -  Managing your Finances? N -  Housekeeping or managing your Housekeeping? N -  Some recent data might be hidden    Patient Care Team: Glendale Chard, MD as PCP - General (Internal Medicine)    Assessment:   This is a routine wellness examination for Tina Ware.  Exercise Activities and Dietary recommendations Current Exercise Habits: The patient does not participate in regular exercise at present  Goals    . DIET - INCREASE WATER INTAKE (pt-stated)    . Exercise 150 min/wk Moderate Activity (pt-stated)       Fall Risk Fall Risk  03/14/2018 02/12/2018  Falls in the past year? 0 0   Is the patient's home free of loose throw rugs in walkways, pet beds, electrical cords, etc?   yes      Grab bars in the bathroom? no      Handrails on the stairs?   no      Adequate lighting?   yes  Timed Get Up and Go performed: n/a  Depression Screen PHQ 2/9 Scores 03/14/2018 02/12/2018 01/15/2018  PHQ - 2 Score 0 0 0  PHQ- 9 Score 0 - -     Cognitive  Function     6CIT Screen 03/14/2018  What Year? 0 points  What month? 0 points  What time? 0 points  Count back from 20 0 points  Months in reverse 0 points  Repeat phrase 0 points  Total Score 0    Immunization History  Administered Date(s) Administered  . Influenza, High Dose Seasonal PF 01/15/2018  . Tdap 07/06/2017    Qualifies for Shingles Vaccine? yes  Screening Tests Health Maintenance  Topic Date Due  . DEXA SCAN  04/28/2006  . PNA vac Low Risk Adult (1 of 2 - PCV13) 04/28/2006  . TETANUS/TDAP  07/07/2027  . INFLUENZA VACCINE  Completed    Cancer Screenings: Lung: Low Dose CT Chest recommended if Age 43-80 years, 30 pack-year currently smoking OR have quit w/in 15years. Patient does not qualify. Breast:  Up to date on Mammogram? No   Up to date of Bone Density/Dexa? Yes Colorectal: ordered today  Additional Screenings: : Hepatitis C Screening: n/a     Plan:    Colonoscopy and prevnar 13 ordered today.     I have personally reviewed and noted the following in the patient's chart:   . Medical and social history . Use of alcohol, tobacco or illicit drugs  . Current medications and supplements . Functional ability and status . Nutritional status . Physical activity . Advanced directives . List of other physicians . Hospitalizations, surgeries, and ER visits in previous 12 months . Vitals . Screenings to include cognitive, depression, and falls . Referrals and appointments  In addition, I have reviewed and discussed with patient certain preventive protocols, quality metrics, and best practice recommendations. A written personalized care plan for preventive services as well as general preventive health recommendations were provided to patient.     Kellie Simmering, LPN  [id number]

## 2018-03-14 NOTE — Patient Instructions (Signed)
Tina Ware , Thank you for taking time to come for your Medicare Wellness Visit. I appreciate your ongoing commitment to your health goals. Please review the following plan we discussed and let me know if I can assist you in the future.   Screening recommendations/referrals: Colonoscopy: ordered Mammogram: appointment in January Bone Density: 02/2016 Recommended yearly ophthalmology/optometry visit for glaucoma screening and checkup Recommended yearly dental visit for hygiene and checkup  Vaccinations: Influenza vaccine: 12/2017 Pneumococcal vaccine: 02/2017 Tdap vaccine: 06/2013 Shingles vaccine:      Advanced directives: Advance directive discussed with you today. I have provided a copy for you to complete at home and have notarized. Once this is complete please bring a copy in to our office so we can scan it into your chart.   Conditions/risks identified: obesity  Next appointment: 10/15/2018 at 2:15   Preventive Care 56 Years and Older, Female Preventive care refers to lifestyle choices and visits with your health care provider that can promote health and wellness. What does preventive care include?  A yearly physical exam. This is also called an annual well check.  Dental exams once or twice a year.  Routine eye exams. Ask your health care provider how often you should have your eyes checked.  Personal lifestyle choices, including:  Daily care of your teeth and gums.  Regular physical activity.  Eating a healthy diet.  Avoiding tobacco and drug use.  Limiting alcohol use.  Practicing safe sex.  Taking low-dose aspirin every day.  Taking vitamin and mineral supplements as recommended by your health care provider. What happens during an annual well check? The services and screenings done by your health care provider during your annual well check will depend on your age, overall health, lifestyle risk factors, and family history of disease. Counseling  Your  health care provider may ask you questions about your:  Alcohol use.  Tobacco use.  Drug use.  Emotional well-being.  Home and relationship well-being.  Sexual activity.  Eating habits.  History of falls.  Memory and ability to understand (cognition).  Work and work Statistician.  Reproductive health. Screening  You may have the following tests or measurements:  Height, weight, and BMI.  Blood pressure.  Lipid and cholesterol levels. These may be checked every 5 years, or more frequently if you are over 76 years old.  Skin check.  Lung cancer screening. You may have this screening every year starting at age 63 if you have a 30-pack-year history of smoking and currently smoke or have quit within the past 15 years.  Fecal occult blood test (FOBT) of the stool. You may have this test every year starting at age 81.  Flexible sigmoidoscopy or colonoscopy. You may have a sigmoidoscopy every 5 years or a colonoscopy every 10 years starting at age 76.  Hepatitis C blood test.  Hepatitis B blood test.  Sexually transmitted disease (STD) testing.  Diabetes screening. This is done by checking your blood sugar (glucose) after you have not eaten for a while (fasting). You may have this done every 1-3 years.  Bone density scan. This is done to screen for osteoporosis. You may have this done starting at age 80.  Mammogram. This may be done every 1-2 years. Talk to your health care provider about how often you should have regular mammograms. Talk with your health care provider about your test results, treatment options, and if necessary, the need for more tests. Vaccines  Your health care provider may recommend certain vaccines,  such as:  Influenza vaccine. This is recommended every year.  Tetanus, diphtheria, and acellular pertussis (Tdap, Td) vaccine. You may need a Td booster every 10 years.  Zoster vaccine. You may need this after age 100.  Pneumococcal 13-valent  conjugate (PCV13) vaccine. One dose is recommended after age 67.  Pneumococcal polysaccharide (PPSV23) vaccine. One dose is recommended after age 13. Talk to your health care provider about which screenings and vaccines you need and how often you need them. This information is not intended to replace advice given to you by your health care provider. Make sure you discuss any questions you have with your health care provider. Document Released: 04/10/2015 Document Revised: 12/02/2015 Document Reviewed: 01/13/2015 Elsevier Interactive Patient Education  2017 [place] Prevention in the Home Falls can cause injuries. They can happen to people of all ages. There are many things you can do to make your home safe and to help prevent falls. What can I do on the outside of my home?  Regularly fix the edges of walkways and driveways and fix any cracks.  Remove anything that might make you trip as you walk through a door, such as a raised step or threshold.  Trim any bushes or trees on the path to your home.  Use bright outdoor lighting.  Clear any walking paths of anything that might make someone trip, such as rocks or tools.  Regularly check to see if handrails are loose or broken. Make sure that both sides of any steps have handrails.  Any raised decks and porches should have guardrails on the edges.  Have any leaves, snow, or ice cleared regularly.  Use sand or salt on walking paths during winter.  Clean up any spills in your garage right away. This includes oil or grease spills. What can I do in the bathroom?  Use night lights.  Install grab bars by the toilet and in the tub and shower. Do not use towel bars as grab bars.  Use non-skid mats or decals in the tub or shower.  If you need to sit down in the shower, use a plastic, non-slip stool.  Keep the floor dry. Clean up any water that spills on the floor as soon as it happens.  Remove soap buildup in the tub or  shower regularly.  Attach bath mats securely with double-sided non-slip rug tape.  Do not have throw rugs and other things on the floor that can make you trip. What can I do in the bedroom?  Use night lights.  Make sure that you have a light by your bed that is easy to reach.  Do not use any sheets or blankets that are too big for your bed. They should not hang down onto the floor.  Have a firm chair that has side arms. You can use this for support while you get dressed.  Do not have throw rugs and other things on the floor that can make you trip. What can I do in the kitchen?  Clean up any spills right away.  Avoid walking on wet floors.  Keep items that you use a lot in easy-to-reach places.  If you need to reach something above you, use a strong step stool that has a grab bar.  Keep electrical cords out of the way.  Do not use floor polish or wax that makes floors slippery. If you must use wax, use non-skid floor wax.  Do not have throw rugs and other things on  the floor that can make you trip. What can I do with my stairs?  Do not leave any items on the stairs.  Make sure that there are handrails on both sides of the stairs and use them. Fix handrails that are broken or loose. Make sure that handrails are as long as the stairways.  Check any carpeting to make sure that it is firmly attached to the stairs. Fix any carpet that is loose or worn.  Avoid having throw rugs at the top or bottom of the stairs. If you do have throw rugs, attach them to the floor with carpet tape.  Make sure that you have a light switch at the top of the stairs and the bottom of the stairs. If you do not have them, ask someone to add them for you. What else can I do to help prevent falls?  Wear shoes that:  Do not have high heels.  Have rubber bottoms.  Are comfortable and fit you well.  Are closed at the toe. Do not wear sandals.  If you use a stepladder:  Make sure that it is fully  opened. Do not climb a closed stepladder.  Make sure that both sides of the stepladder are locked into place.  Ask someone to hold it for you, if possible.  Clearly mark and make sure that you can see:  Any grab bars or handrails.  First and last steps.  Where the edge of each step is.  Use tools that help you move around (mobility aids) if they are needed. These include:  Canes.  Walkers.  Scooters.  Crutches.  Turn on the lights when you go into a dark area. Replace any light bulbs as soon as they burn out.  Set up your furniture so you have a clear path. Avoid moving your furniture around.  If any of your floors are uneven, fix them.  If there are any pets around you, be aware of where they are.  Review your medicines with your doctor. Some medicines can make you feel dizzy. This can increase your chance of falling. Ask your doctor what other things that you can do to help prevent falls. This information is not intended to replace advice given to you by your health care provider. Make sure you discuss any questions you have with your health care provider. Document Released: 01/08/2009 Document Revised: 08/20/2015 Document Reviewed: 04/18/2014 Elsevier Interactive Patient Education  2017 [employer].   Exercise for Older Adults Staying physically active is important as you age. The four types of exercises that are best for older adults are endurance, strength, balance, and flexibility. Contact your health care provider before you start any exercise routine. Ask your health care provider what activities are safe for you. What are the risks? Risks associated with exercising include: Overdoing it. This may lead to sore muscles or fatigue. Falls. Injuries. Dehydration. How to do these exercises Endurance exercises Endurance (aerobic) exercises raise your breathing rate and heart rate. Increasing your endurance helps you to do everyday tasks and stay healthy. By  improving the health of your body system that includes your heart, lungs, and blood vessels (circulatory system), you may also delay or prevent diseases such as heart disease, diabetes, and bone loss (osteoporosis). Types of endurance exercises include: Sports. Indoor activities, such as using gym equipment, doing water aerobics, or dancing. Outdoor activities, such as biking or jogging. Tasks around the house, such as gardening, yard work, and heavy household chores like cleaning. Walking,  such as hiking or walking around your neighborhood. When doing endurance exercises, make sure you: Are aware of your surroundings. Use safety equipment as directed. Dress in layers when exercising outdoors. Drink plenty of water to stay well hydrated. Build up endurance slowly. Start with 10 minutes at a time, and gradually build up to doing 30 minutes at a time. Unless your health care provider gave you different instructions, aim to exercise for a total of 150 minutes a week. Spread out that time so you are working on endurance on 3 or more days a week. Strength exercises Lifting, pulling, or pushing weights helps to strengthen muscles. Having stronger muscles makes it easier to do everyday activities, such as getting up from a chair, climbing stairs, carrying groceries, and playing with grandchildren. Strength exercises include arm and leg exercises that may be done: With weights. Without weights (using your own body weight). With a resistance band. When doing strength exercises: Move smoothly and steadily. Do not suddenly thrust or jerk the weights, the resistance band, or your body. Start with no weights or with light weights, and gradually add more weight over time. Eventually, aim to use weights that are hard or very hard for you to lift. This means that you are able to do 8 repetitions with the weight, and the last few repetitions are very challenging. Lift or push weights into position for 3 seconds,  hold the position for 1 second, and then take 3 seconds to return to your starting position. Breathe out (exhale) during difficult movements, like lifting or pushing weights. Breathe in (inhale) to relax your muscles before the next repetition. Consider alternating arms or legs, especially when you first start strength exercises. Expect some slight muscle soreness after each session. Do strength exercises on 2 or more days a week, for 30 minutes at a time. Avoid exercising the same muscle groups two days in a row. For example, if you work on your leg muscles one day, work on your arm muscles the next day. When you can do two sets of 10-15 repetitions with a certain weight, increase the amount of weight. Balance Balance exercises can help to prevent falls. Balance exercises include: Standing on one foot. Heel-to-toe walk. Balance walk. Tai chi. Make sure you have something sturdy to hold onto while doing balance exercises, such as a sturdy chair. As your balance improves, challenge yourself by holding onto the chair with one hand instead of two, and then with no hands. Trying exercises with your eyes closed also challenges your balance, but be sure to have a sturdy surface (like a countertop) close by in case you need it. Do balance exercises as often as you want, or as often as directed by your health care provider. Strength exercises for the lower body also help to improve balance. Flexibility Flexibility exercises improve how far you can bend, straighten, move, or rotate parts of your body (range of motion). These exercises also help you to do everyday activities such as getting dressed or reaching for objects. Flexibility exercises include stretching different parts of the body, and they may be done in a standing or seated position or on the floor. When stretching, make sure you: Keep a slight bend in your arms and legs. Avoid completely straightening ("locking") your joints. Do not stretch so  far that you feel pain. You should feel a mild stretching feeling. You may try stretching farther as you become more flexible over time. Relax and breathe between stretches. Hold onto something  sturdy for balance as needed. Hold each stretch for 10-30 seconds. Repeat each stretch 3-5 times. General safety tips Exercise in well-lit areas. Do not hold your breath during exercises or stretches. Warm up before exercising, and cool down after exercising. This can help prevent injury. Drink plenty of water during exercise or any activity that makes you sweat. Use smooth, steady movements. Do not use sudden, jerking movements, especially when lifting weights or doing flexibility exercises. If you are not sure if an exercise is safe for you, or you are not sure how to do an exercise, talk with your health care provider. This is especially important if you have had surgery on muscles, bones, or joints (orthopedic surgery). Where to find more information You can find more information about exercise for older adults from: Your local health department, fitness center, or community center. These facilities may have programs for aging adults. [employer] on Aging: http://kim-miller.com/ National Council on Aging: www.ncoa.org Summary Staying physically active is important as you age. Make sure to contact your health care provider before you start any exercise routine. Ask your health care provider what activities are safe for you. Doing endurance, strength, balance, and flexibility exercises can help to delay or prevent certain diseases, such as heart disease, diabetes, and bone loss (osteoporosis). This information is not intended to replace advice given to you by your health care provider. Make sure you discuss any questions you have with your health care provider. Document Released: 08/03/2016 Document Revised: 08/03/2016 Document Reviewed: 08/03/2016 Elsevier Interactive Patient Education  2019 [employer].

## 2018-03-26 LAB — HM MAMMOGRAPHY: HM Mammogram: NORMAL (ref 0–4)

## 2018-03-26 LAB — HM DEXA SCAN

## 2018-04-10 ENCOUNTER — Telehealth: Payer: Self-pay

## 2018-04-10 ENCOUNTER — Encounter: Payer: Self-pay | Admitting: Nurse Practitioner

## 2018-04-10 NOTE — Telephone Encounter (Signed)
Called pt to see If she would like to start a medication to help with her osteoprosis. Patient stated what are the side effects? And would it help strengthening her bones to help her balance? If she is taking vitamin d and fish oil would that help her bones? YRL,RMA

## 2018-04-13 NOTE — Telephone Encounter (Signed)
The medication is called Fosamax, she will need to take in the morning and stay upright for 30 minutes after taking. It may not necessarily help with balance, if she needs a referral to PT for balance we can do that.

## 2018-04-16 ENCOUNTER — Other Ambulatory Visit: Payer: Self-pay | Admitting: Nurse Practitioner

## 2018-04-19 NOTE — Telephone Encounter (Signed)
Most common side effects are abdominal pain, bloating or swelling of face, bone, joint or muscle pain, chest pain, convulsions, diarrhea or difficulty breathing however these are rare.  You have more risk of fractures with your bone degeneration.

## 2018-04-26 ENCOUNTER — Other Ambulatory Visit: Payer: Self-pay | Admitting: Nurse Practitioner

## 2018-06-08 ENCOUNTER — Other Ambulatory Visit: Payer: Self-pay | Admitting: Nurse Practitioner

## 2018-06-08 DIAGNOSIS — R600 Localized edema: Secondary | ICD-10-CM

## 2018-06-08 DIAGNOSIS — I10 Essential (primary) hypertension: Secondary | ICD-10-CM

## 2018-06-18 ENCOUNTER — Ambulatory Visit: Payer: Medicare Other | Admitting: Internal Medicine

## 2018-06-18 ENCOUNTER — Encounter: Payer: Self-pay | Admitting: Internal Medicine

## 2018-06-18 ENCOUNTER — Other Ambulatory Visit: Payer: Self-pay

## 2018-06-18 ENCOUNTER — Other Ambulatory Visit: Payer: Self-pay | Admitting: Nurse Practitioner

## 2018-06-18 VITALS — BP 146/92 | HR 79 | Temp 98.3°F | Ht 61.8 in | Wt 168.0 lb

## 2018-06-18 DIAGNOSIS — R7309 Other abnormal glucose: Secondary | ICD-10-CM

## 2018-06-18 DIAGNOSIS — M79642 Pain in left hand: Secondary | ICD-10-CM

## 2018-06-18 DIAGNOSIS — R202 Paresthesia of skin: Secondary | ICD-10-CM

## 2018-06-18 DIAGNOSIS — M79641 Pain in right hand: Secondary | ICD-10-CM | POA: Diagnosis not present

## 2018-06-18 DIAGNOSIS — I10 Essential (primary) hypertension: Secondary | ICD-10-CM | POA: Diagnosis not present

## 2018-06-18 DIAGNOSIS — K130 Diseases of lips: Secondary | ICD-10-CM

## 2018-06-18 DIAGNOSIS — Z79899 Other long term (current) drug therapy: Secondary | ICD-10-CM

## 2018-06-18 NOTE — Patient Instructions (Signed)
DASH Eating Plan  DASH stands for "Dietary Approaches to Stop Hypertension." The DASH eating plan is a healthy eating plan that has been shown to reduce high blood pressure (hypertension). It may also reduce your risk for type 2 diabetes, heart disease, and stroke. The DASH eating plan may also help with weight loss.  What are tips for following this plan?    General guidelines   Avoid eating more than 2,300 mg (milligrams) of salt (sodium) a day. If you have hypertension, you may need to reduce your sodium intake to 1,500 mg a day.   Limit alcohol intake to no more than 1 drink a day for nonpregnant women and 2 drinks a day for men. One drink equals 12 oz of beer, 5 oz of wine, or 1 oz of hard liquor.   Work with your health care provider to maintain a healthy body weight or to lose weight. Ask what an ideal weight is for you.   Get at least 30 minutes of exercise that causes your heart to beat faster (aerobic exercise) most days of the week. Activities may include walking, swimming, or biking.   Work with your health care provider or diet and nutrition specialist (dietitian) to adjust your eating plan to your individual calorie needs.  Reading food labels     Check food labels for the amount of sodium per serving. Choose foods with less than 5 percent of the Daily Value of sodium. Generally, foods with less than 300 mg of sodium per serving fit into this eating plan.   To find whole grains, look for the word "whole" as the first word in the ingredient list.  Shopping   Buy products labeled as "low-sodium" or "no salt added."   Buy fresh foods. Avoid canned foods and premade or frozen meals.  Cooking   Avoid adding salt when cooking. Use salt-free seasonings or herbs instead of table salt or sea salt. Check with your health care provider or pharmacist before using salt substitutes.   Do not fry foods. Cook foods using healthy methods such as baking, boiling, grilling, and broiling instead.   Cook with  heart-healthy oils, such as olive, canola, soybean, or sunflower oil.  Meal planning   Eat a balanced diet that includes:  ? 5 or more servings of fruits and vegetables each day. At each meal, try to fill half of your plate with fruits and vegetables.  ? Up to 6-8 servings of whole grains each day.  ? Less than 6 oz of lean meat, poultry, or fish each day. A 3-oz serving of meat is about the same size as a deck of cards. One egg equals 1 oz.  ? 2 servings of low-fat dairy each day.  ? A serving of nuts, seeds, or beans 5 times each week.  ? Heart-healthy fats. Healthy fats called Omega-3 fatty acids are found in foods such as flaxseeds and coldwater fish, like sardines, salmon, and mackerel.   Limit how much you eat of the following:  ? Canned or prepackaged foods.  ? Food that is high in trans fat, such as fried foods.  ? Food that is high in saturated fat, such as fatty meat.  ? Sweets, desserts, sugary drinks, and other foods with added sugar.  ? Full-fat dairy products.   Do not salt foods before eating.   Try to eat at least 2 vegetarian meals each week.   Eat more home-cooked food and less restaurant, buffet, and fast food.     When eating at a restaurant, ask that your food be prepared with less salt or no salt, if possible.  What foods are recommended?  The items listed may not be a complete list. Talk with your dietitian about what dietary choices are best for you.  Grains  Whole-grain or whole-wheat bread. Whole-grain or whole-wheat pasta. Brown rice. Oatmeal. Quinoa. Bulgur. Whole-grain and low-sodium cereals. Pita bread. Low-fat, low-sodium crackers. Whole-wheat flour tortillas.  Vegetables  Fresh or frozen vegetables (raw, steamed, roasted, or grilled). Low-sodium or reduced-sodium tomato and vegetable juice. Low-sodium or reduced-sodium tomato sauce and tomato paste. Low-sodium or reduced-sodium canned vegetables.  Fruits  All fresh, dried, or frozen fruit. Canned fruit in natural juice (without  added sugar).  Meat and other protein foods  Skinless chicken or turkey. Ground chicken or turkey. Pork with fat trimmed off. Fish and seafood. Egg whites. Dried beans, peas, or lentils. Unsalted nuts, nut butters, and seeds. Unsalted canned beans. Lean cuts of beef with fat trimmed off. Low-sodium, lean deli meat.  Dairy  Low-fat (1%) or fat-free (skim) milk. Fat-free, low-fat, or reduced-fat cheeses. Nonfat, low-sodium ricotta or cottage cheese. Low-fat or nonfat yogurt. Low-fat, low-sodium cheese.  Fats and oils  Soft margarine without trans fats. Vegetable oil. Low-fat, reduced-fat, or light mayonnaise and salad dressings (reduced-sodium). Canola, safflower, olive, soybean, and sunflower oils. Avocado.  Seasoning and other foods  Herbs. Spices. Seasoning mixes without salt. Unsalted popcorn and pretzels. Fat-free sweets.  What foods are not recommended?  The items listed may not be a complete list. Talk with your dietitian about what dietary choices are best for you.  Grains  Baked goods made with fat, such as croissants, muffins, or some breads. Dry pasta or rice meal packs.  Vegetables  Creamed or fried vegetables. Vegetables in a cheese sauce. Regular canned vegetables (not low-sodium or reduced-sodium). Regular canned tomato sauce and paste (not low-sodium or reduced-sodium). Regular tomato and vegetable juice (not low-sodium or reduced-sodium). Pickles. Olives.  Fruits  Canned fruit in a light or heavy syrup. Fried fruit. Fruit in cream or butter sauce.  Meat and other protein foods  Fatty cuts of meat. Ribs. Fried meat. Bacon. Sausage. Bologna and other processed lunch meats. Salami. Fatback. Hotdogs. Bratwurst. Salted nuts and seeds. Canned beans with added salt. Canned or smoked fish. Whole eggs or egg yolks. Chicken or turkey with skin.  Dairy  Whole or 2% milk, cream, and half-and-half. Whole or full-fat cream cheese. Whole-fat or sweetened yogurt. Full-fat cheese. Nondairy creamers. Whipped toppings.  Processed cheese and cheese spreads.  Fats and oils  Butter. Stick margarine. Lard. Shortening. Ghee. Bacon fat. Tropical oils, such as coconut, palm kernel, or palm oil.  Seasoning and other foods  Salted popcorn and pretzels. Onion salt, garlic salt, seasoned salt, table salt, and sea salt. Worcestershire sauce. Tartar sauce. Barbecue sauce. Teriyaki sauce. Soy sauce, including reduced-sodium. Steak sauce. Canned and packaged gravies. Fish sauce. Oyster sauce. Cocktail sauce. Horseradish that you find on the shelf. Ketchup. Mustard. Meat flavorings and tenderizers. Bouillon cubes. Hot sauce and Tabasco sauce. Premade or packaged marinades. Premade or packaged taco seasonings. Relishes. Regular salad dressings.  Where to find more information:   National Heart, Lung, and Blood Institute: www.nhlbi.nih.gov   American Heart Association: www.heart.org  Summary   The DASH eating plan is a healthy eating plan that has been shown to reduce high blood pressure (hypertension). It may also reduce your risk for type 2 diabetes, heart disease, and stroke.   With the   DASH eating plan, you should limit salt (sodium) intake to 2,300 mg a day. If you have hypertension, you may need to reduce your sodium intake to 1,500 mg a day.   When on the DASH eating plan, aim to eat more fresh fruits and vegetables, whole grains, lean proteins, low-fat dairy, and heart-healthy fats.   Work with your health care provider or diet and nutrition specialist (dietitian) to adjust your eating plan to your individual calorie needs.  This information is not intended to replace advice given to you by your health care provider. Make sure you discuss any questions you have with your health care provider.  Document Released: 03/03/2011 Document Revised: 03/07/2016 Document Reviewed: 03/07/2016  Elsevier Interactive Patient Education  2019 Elsevier Inc.

## 2018-06-19 LAB — CYCLIC CITRUL PEPTIDE ANTIBODY, IGG/IGA: Cyclic Citrullin Peptide Ab: 8 units (ref 0–19)

## 2018-06-19 LAB — BMP8+EGFR
BUN/Creatinine Ratio: 13 (ref 12–28)
BUN: 10 mg/dL (ref 8–27)
CO2: 24 mmol/L (ref 20–29)
Calcium: 9.6 mg/dL (ref 8.7–10.3)
Chloride: 93 mmol/L — ABNORMAL LOW (ref 96–106)
Creatinine, Ser: 0.79 mg/dL (ref 0.57–1.00)
GFR calc Af Amer: 84 mL/min/{1.73_m2} (ref >59)
GFR calc non Af Amer: 72 mL/min/{1.73_m2} (ref >59)
Glucose: 85 mg/dL (ref 65–99)
Potassium: 4.6 mmol/L (ref 3.5–5.2)
Sodium: 132 mmol/L — ABNORMAL LOW (ref 134–144)

## 2018-06-19 LAB — HEMOGLOBIN A1C
Est. average glucose Bld gHb Est-mCnc: 117 mg/dL
Hgb A1c MFr Bld: 5.7 % — ABNORMAL HIGH (ref 4.8–5.6)

## 2018-06-19 LAB — ANTINUCLEAR ANTIBODIES, IFA: ANA Titer 1: NEGATIVE

## 2018-06-19 LAB — VITAMIN B12: Vitamin B-12: 573 pg/mL (ref 232–1245)

## 2018-06-19 LAB — SEDIMENTATION RATE: Sed Rate: 8 mm/hr (ref 0–40)

## 2018-06-19 LAB — RHEUMATOID FACTOR: Rhuematoid fact SerPl-aCnc: 61.9 IU/mL — ABNORMAL HIGH (ref 0.0–13.9)

## 2018-06-19 LAB — URIC ACID: Uric Acid: 4.2 mg/dL (ref 2.5–7.1)

## 2018-06-20 ENCOUNTER — Ambulatory Visit: Payer: Medicare Other | Admitting: Internal Medicine

## 2018-06-24 NOTE — Progress Notes (Signed)
Subjective:     [name] , female    DOB: 01-Jul-1940 , 78 y.o.   MRN: [id number]   Chief Complaint  Patient presents with  . Hypertension    HPI  Hypertension  This is a chronic problem. The current episode started more than 1 year ago. The problem has been gradually improving since onset. The problem is controlled. Pertinent negatives include no blurred vision, chest pain, palpitations or shortness of breath.     Past Medical History:  Diagnosis Date  . Arthritis    RA  . CAD (coronary artery disease)   . Fibroid, uterine   . Hypertension      Family History  Problem Relation Age of Onset  . Heart disease Mother   . Dementia Mother   . Parkinson's disease Father      Current Outpatient Medications:  .  aspirin EC 81 MG tablet, Take 81 mg by mouth daily., Disp: , Rfl:  .  carvedilol (COREG) 6.25 MG tablet, TAKE 1 TABLET BY MOUTH TWICE A DAY WITH FOOD (Patient taking differently: 6.25 mg daily. ), Disp: 180 tablet, Rfl: 0 .  cholecalciferol (VITAMIN D) 1000 units tablet, Take 1,000 Units by mouth daily., Disp: , Rfl:  .  furosemide (LASIX) 20 MG tablet, TAKE 1 TABLET BY MOUTH EVERY DAY AS NEEDED, Disp: 90 tablet, Rfl: 1 .  losartan (COZAAR) 25 MG tablet, TAKE 1 TABLET BY MOUTH EVERY DAY, Disp: 90 tablet, Rfl: 1 .  mometasone (NASONEX) 50 MCG/ACT nasal spray, Place 2 sprays into the nose daily as needed (allergies)., Disp: , Rfl:  .  Multiple Vitamin (MULTIVITAMIN WITH MINERALS) TABS tablet, Take 1 tablet by mouth daily., Disp: , Rfl:  .  Omega-3 1000 MG CAPS, Take 1,000 mg by mouth daily., Disp: , Rfl:  .  Pramoxine-HC (HYDROCORTISONE ACE-PRAMOXINE) 2.5-1 % CREA, Apply 1 Syringe topically 3 (three) times daily as needed., Disp: 1 Tube, Rfl: 3 .  triamterene-hydrochlorothiazide (MAXZIDE-25) 37.5-25 MG tablet, TAKE 1 TABLET BY MOUTH EVERY DAY, Disp: 30 tablet, Rfl: 1 .  benzonatate (TESSALON) 100 MG capsule, TAKE ONE CAPSULE BY MOUTH 3 TIMES A DAY AS  NEEDED FOR COUGH, Disp: 30 capsule, Rfl: 0   No Known Allergies   Review of Systems  Constitutional: Negative.   Eyes: Negative for blurred vision.  Respiratory: Negative.  Negative for shortness of breath.   Cardiovascular: Negative.  Negative for chest pain and palpitations.  Gastrointestinal: Negative.   Musculoskeletal: Positive for arthralgias (she c/o b/l hand pain. also w/ numbness ).  Neurological: Negative.   Psychiatric/Behavioral: Negative.      Today's Vitals   06/18/18 1502  BP: (!) 146/92  Pulse: 79  Temp: 98.3 F (36.8 C)  TempSrc: Oral  Weight: 168 lb (76.2 kg)  Height: 5' 1.8" (1.57 m)  PainSc: 4   PainLoc: Hand   Body mass index is 30.93 kg/m.   Objective:  Physical Exam Vitals signs and nursing note reviewed.  Constitutional:      Appearance: Normal appearance.  HENT:     Head: Normocephalic and atraumatic.  Cardiovascular:     Rate and Rhythm: Normal rate and regular rhythm.     Heart sounds: Normal heart sounds.  Pulmonary:     Effort: Pulmonary effort is normal.     Breath sounds: Normal breath sounds.  Musculoskeletal:     Right hand: She exhibits tenderness.     Left hand: She exhibits tenderness.     Comments: Positive  squeeze test   Skin:    General: Skin is warm.  Neurological:     General: No focal deficit present.     Mental Status: She is alert.  Psychiatric:        Mood and Affect: Mood normal.        Behavior: Behavior normal.         Assessment And Plan:     1. Essential hypertension, benign  Fair control.  She will continue with current meds for now. She is encouraged to avoid adding salt to her foods. She will rto in 4 months for re-evaluation.   - BMP8+EGFR  2. Abnormal glucose  HER A1C HAS BEEN ELEVATED IN THE PAST. I WILL CHECK AN A1C, BMET TODAY. SHE WAS ENCOURAGED TO AVOID SUGARY BEVERAGES AND PROCESSED FOODS INCLUDNG BREADS, RICE AND PASTA.  - Hemoglobin A1c  3. Pain in both hands  I will check an  arthritis panel. I will make further recommendations once her labs are available.   - ANA, IFA (with reflex) - CYCLIC CITRUL PEPTIDE ANTIBODY, IGG/IGA - Rheumatoid factor - Sedimentation rate - Uric acid  4. Paresthesia  I will check vitamin B12 level today.   5. Angular cheilitis  She is encouraged to start vitamin B supplementation.   6. Drug therapy  - Vitamin B12        [name], MD

## 2018-06-27 ENCOUNTER — Other Ambulatory Visit: Payer: Self-pay | Admitting: Nurse Practitioner

## 2018-06-27 NOTE — Telephone Encounter (Signed)
Benzonatate capsule refill

## 2018-06-28 NOTE — Telephone Encounter (Signed)
She will need a virtual visit if this is for a new cough

## 2018-07-05 ENCOUNTER — Other Ambulatory Visit: Payer: Self-pay | Admitting: Internal Medicine

## 2018-07-05 ENCOUNTER — Telehealth: Payer: Self-pay

## 2018-07-05 MED ORDER — BENZONATATE 100 MG PO CAPS: ORAL_CAPSULE | ORAL | 1 refills | Status: DC

## 2018-07-05 NOTE — Telephone Encounter (Signed)
The pt wants to know if she can get a refill on the benzonate cough capsules and that she forgot to ask at her visit.

## 2018-07-27 ENCOUNTER — Other Ambulatory Visit: Payer: Self-pay | Admitting: Nurse Practitioner

## 2018-07-31 ENCOUNTER — Ambulatory Visit: Payer: Medicare Other | Admitting: Internal Medicine

## 2018-08-06 ENCOUNTER — Ambulatory Visit: Payer: Medicare Other | Admitting: Internal Medicine

## 2018-08-06 ENCOUNTER — Encounter: Payer: Self-pay | Admitting: Internal Medicine

## 2018-08-06 ENCOUNTER — Other Ambulatory Visit: Payer: Self-pay

## 2018-08-06 VITALS — BP 136/78 | HR 91 | Temp 98.3°F | Ht 61.8 in | Wt 166.8 lb

## 2018-08-06 DIAGNOSIS — I10 Essential (primary) hypertension: Secondary | ICD-10-CM

## 2018-08-06 NOTE — Patient Instructions (Addendum)
MEDICATION CHANGES::   INCREASE LOSARTAN TO 25MG  - TWO TABLETS DAILY    DASH Eating Plan DASH stands for "Dietary Approaches to Stop Hypertension." The DASH eating plan is a healthy eating plan that has been shown to reduce high blood pressure (hypertension). It may also reduce your risk for type 2 diabetes, heart disease, and stroke. The DASH eating plan may also help with weight loss. What are tips for following this plan?  General guidelines  Avoid eating more than 2,300 mg (milligrams) of salt (sodium) a day. If you have hypertension, you may need to reduce your sodium intake to 1,500 mg a day.  Limit alcohol intake to no more than 1 drink a day for nonpregnant women and 2 drinks a day for men. One drink equals 12 oz of beer, 5 oz of wine, or 1 oz of hard liquor.  Work with your health care provider to maintain a healthy body weight or to lose weight. Ask what an ideal weight is for you.  Get at least 30 minutes of exercise that causes your heart to beat faster (aerobic exercise) most days of the week. Activities may include walking, swimming, or biking.  Work with your health care provider or diet and nutrition specialist (dietitian) to adjust your eating plan to your individual calorie needs. Reading food labels   Check food labels for the amount of sodium per serving. Choose foods with less than 5 percent of the Daily Value of sodium. Generally, foods with less than 300 mg of sodium per serving fit into this eating plan.  To find whole grains, look for the word "whole" as the first word in the ingredient list. Shopping  Buy products labeled as "low-sodium" or "no salt added."  Buy fresh foods. Avoid canned foods and premade or frozen meals. Cooking  Avoid adding salt when cooking. Use salt-free seasonings or herbs instead of table salt or sea salt. Check with your health care provider or pharmacist before using salt substitutes.  Do not fry foods. Cook foods using healthy  methods such as baking, boiling, grilling, and broiling instead.  Cook with heart-healthy oils, such as olive, canola, soybean, or sunflower oil. Meal planning  Eat a balanced diet that includes: ? 5 or more servings of fruits and vegetables each day. At each meal, try to fill half of your plate with fruits and vegetables. ? Up to 6-8 servings of whole grains each day. ? Less than 6 oz of lean meat, poultry, or fish each day. A 3-oz serving of meat is about the same size as a deck of cards. One egg equals 1 oz. ? 2 servings of low-fat dairy each day. ? A serving of nuts, seeds, or beans 5 times each week. ? Heart-healthy fats. Healthy fats called Omega-3 fatty acids are found in foods such as flaxseeds and coldwater fish, like sardines, salmon, and mackerel.  Limit how much you eat of the following: ? Canned or prepackaged foods. ? Food that is high in trans fat, such as fried foods. ? Food that is high in saturated fat, such as fatty meat. ? Sweets, desserts, sugary drinks, and other foods with added sugar. ? Full-fat dairy products.  Do not salt foods before eating.  Try to eat at least 2 vegetarian meals each week.  Eat more home-cooked food and less restaurant, buffet, and fast food.  When eating at a restaurant, ask that your food be prepared with less salt or no salt, if possible. What foods are  recommended? The items listed may not be a complete list. Talk with your dietitian about what dietary choices are best for you. Grains Whole-grain or whole-wheat bread. Whole-grain or whole-wheat pasta. Brown rice. [place]. Bulgur. Whole-grain and low-sodium cereals. Pita bread. Low-fat, low-sodium crackers. Whole-wheat flour tortillas. Vegetables Fresh or frozen vegetables (raw, steamed, roasted, or grilled). Low-sodium or reduced-sodium tomato and vegetable juice. Low-sodium or reduced-sodium tomato sauce and tomato paste. Low-sodium or reduced-sodium canned  vegetables. Fruits All fresh, dried, or frozen fruit. Canned fruit in natural juice (without added sugar). Meat and other protein foods Skinless chicken or [place]. Ground chicken or [place]. Pork with fat trimmed off. Fish and seafood. Egg whites. Dried beans, peas, or lentils. Unsalted nuts, nut butters, and seeds. Unsalted canned beans. Lean cuts of beef with fat trimmed off. Low-sodium, lean deli meat. Dairy Low-fat (1%) or fat-free (skim) milk. Fat-free, low-fat, or reduced-fat cheeses. Nonfat, low-sodium ricotta or cottage cheese. Low-fat or nonfat yogurt. Low-fat, low-sodium cheese. Fats and oils Soft margarine without trans fats. Vegetable oil. Low-fat, reduced-fat, or light mayonnaise and salad dressings (reduced-sodium). Canola, safflower, olive, soybean, and sunflower oils. Avocado. Seasoning and other foods Herbs. Spices. Seasoning mixes without salt. Unsalted popcorn and pretzels. Fat-free sweets. What foods are not recommended? The items listed may not be a complete list. Talk with your dietitian about what dietary choices are best for you. Grains Baked goods made with fat, such as croissants, muffins, or some breads. Dry pasta or rice meal packs. Vegetables Creamed or fried vegetables. Vegetables in a cheese sauce. Regular canned vegetables (not low-sodium or reduced-sodium). Regular canned tomato sauce and paste (not low-sodium or reduced-sodium). Regular tomato and vegetable juice (not low-sodium or reduced-sodium). Angie Fava. Olives. Fruits Canned fruit in a light or heavy syrup. Fried fruit. Fruit in cream or butter sauce. Meat and other protein foods Fatty cuts of meat. Ribs. Fried meat. Berniece Salines. Sausage. Bologna and other processed lunch meats. Salami. Fatback. Hotdogs. Bratwurst. Salted nuts and seeds. Canned beans with added salt. Canned or smoked fish. Whole eggs or egg yolks. Chicken or [place] with skin. Dairy Whole or 2% milk, cream, and half-and-half. Whole or full-fat  cream cheese. Whole-fat or sweetened yogurt. Full-fat cheese. Nondairy creamers. Whipped toppings. Processed cheese and cheese spreads. Fats and oils Butter. Stick margarine. Lard. Shortening. Ghee. Bacon fat. Tropical oils, such as coconut, palm kernel, or palm oil. Seasoning and other foods Salted popcorn and pretzels. Onion salt, garlic salt, seasoned salt, table salt, and sea salt. Worcestershire sauce. Tartar sauce. Barbecue sauce. Teriyaki sauce. Soy sauce, including reduced-sodium. Steak sauce. Canned and packaged gravies. Fish sauce. Oyster sauce. Cocktail sauce. Horseradish that you find on the shelf. Ketchup. Mustard. Meat flavorings and tenderizers. Bouillon cubes. Hot sauce and Tabasco sauce. Premade or packaged marinades. Premade or packaged taco seasonings. Relishes. Regular salad dressings. Where to find more information:  National Heart, Lung, and [place]: https://wilson-eaton.com/  American Heart Association: www.heart.org Summary  The DASH eating plan is a healthy eating plan that has been shown to reduce high blood pressure (hypertension). It may also reduce your risk for type 2 diabetes, heart disease, and stroke.  With the DASH eating plan, you should limit salt (sodium) intake to 2,300 mg a day. If you have hypertension, you may need to reduce your sodium intake to 1,500 mg a day.  When on the DASH eating plan, aim to eat more fresh fruits and vegetables, whole grains, lean proteins, low-fat dairy, and heart-healthy fats.  Work with your health  care provider or diet and nutrition specialist (dietitian) to adjust your eating plan to your individual calorie needs. This information is not intended to replace advice given to you by your health care provider. Make sure you discuss any questions you have with your health care provider. Document Released: 03/03/2011 Document Revised: 03/07/2016 Document Reviewed: 03/07/2016 Elsevier Interactive Patient Education  2019 [employer].

## 2018-08-06 NOTE — Progress Notes (Signed)
Subjective:     [name] , female    DOB: May 04, 1940 , 78 y.o.   MRN: [id number]   Chief Complaint  Patient presents with  . Hypertension    HPI  She is here today for a bp check. She reports compliance with meds. She denies adding salt to her foods. She admits that she has not been as active as she should be. She denies headaches, chest pain and shortness of breath.   Hypertension      Past Medical History:  Diagnosis Date  . Arthritis    RA  . CAD (coronary artery disease)   . Fibroid, uterine   . Hypertension      Family History  Problem Relation Age of Onset  . Heart disease Mother   . Dementia Mother   . Parkinson's disease Father      Current Outpatient Medications:  .  aspirin EC 81 MG tablet, Take 81 mg by mouth daily., Disp: , Rfl:  .  benzonatate (TESSALON) 100 MG capsule, TAKE ONE CAPSULE BY MOUTH 3 TIMES A DAY AS NEEDED FOR COUGH, Disp: 30 capsule, Rfl: 1 .  carvedilol (COREG) 6.25 MG tablet, TAKE 1 TABLET BY MOUTH TWICE A DAY WITH FOOD, Disp: 180 tablet, Rfl: 0 .  cholecalciferol (VITAMIN D) 1000 units tablet, Take 1,000 Units by mouth daily., Disp: , Rfl:  .  furosemide (LASIX) 20 MG tablet, TAKE 1 TABLET BY MOUTH EVERY DAY AS NEEDED, Disp: 90 tablet, Rfl: 1 .  losartan (COZAAR) 25 MG tablet, TAKE 1 TABLET BY MOUTH EVERY DAY, Disp: 90 tablet, Rfl: 1 .  mometasone (NASONEX) 50 MCG/ACT nasal spray, Place 2 sprays into the nose daily as needed (allergies)., Disp: , Rfl:  .  Multiple Vitamin (MULTIVITAMIN WITH MINERALS) TABS tablet, Take 1 tablet by mouth daily., Disp: , Rfl:  .  Omega-3 1000 MG CAPS, Take 1,000 mg by mouth daily., Disp: , Rfl:  .  Pramoxine-HC (HYDROCORTISONE ACE-PRAMOXINE) 2.5-1 % CREA, Apply 1 Syringe topically 3 (three) times daily as needed., Disp: 1 Tube, Rfl: 3 .  triamterene-hydrochlorothiazide (MAXZIDE-25) 37.5-25 MG tablet, TAKE 1 TABLET BY MOUTH EVERY DAY, Disp: 30 tablet, Rfl: 1   No Known Allergies   Review  of Systems  Constitutional: Negative.   Respiratory: Negative.   Cardiovascular: Negative.   Gastrointestinal: Negative.   Neurological: Negative.   Psychiatric/Behavioral: Negative.      Today's Vitals   08/06/18 0938  BP: 136/78  Pulse: 91  Temp: 98.3 F (36.8 C)  TempSrc: Oral  Weight: 166 lb 12.8 oz (75.7 kg)  Height: 5' 1.8" (1.57 m)  PainSc: 6   PainLoc: Hand   Body mass index is 30.71 kg/m.   Objective:  Physical Exam Vitals signs and nursing note reviewed.  Constitutional:      Appearance: Normal appearance.  HENT:     Head: Normocephalic and atraumatic.  Cardiovascular:     Rate and Rhythm: Normal rate and regular rhythm.     Heart sounds: Normal heart sounds.  Pulmonary:     Effort: Pulmonary effort is normal.     Breath sounds: Normal breath sounds.  Skin:    General: Skin is warm.  Neurological:     General: No focal deficit present.     Mental Status: She is alert.  Psychiatric:        Mood and Affect: Mood normal.        Behavior: Behavior normal.  Assessment And Plan:     1. Essential hypertension, benign  Chronic, uncontrolled - initial systolic bp in the 464V. I will increase her losartan to 25mg  - 2 tabs daily, for a total of 50mg  daily. Eventually, I will combine this with hctz 12.5mg  daily.  She wants to first use of present rx for maxzide prior to switching to new medication. She will rto in four weeks for re-evaluation. I will check her renal function at her next visit.  [name], MD    THE PATIENT IS ENCOURAGED TO PRACTICE SOCIAL DISTANCING DUE TO THE COVID-19 PANDEMIC.

## 2018-08-07 ENCOUNTER — Other Ambulatory Visit: Payer: Self-pay | Admitting: Nurse Practitioner

## 2018-08-25 ENCOUNTER — Other Ambulatory Visit: Payer: Self-pay | Admitting: Nurse Practitioner

## 2018-08-25 DIAGNOSIS — R600 Localized edema: Secondary | ICD-10-CM

## 2018-08-25 DIAGNOSIS — I10 Essential (primary) hypertension: Secondary | ICD-10-CM

## 2018-09-01 ENCOUNTER — Other Ambulatory Visit: Payer: Self-pay | Admitting: Nurse Practitioner

## 2018-09-01 DIAGNOSIS — R600 Localized edema: Secondary | ICD-10-CM

## 2018-09-01 DIAGNOSIS — I10 Essential (primary) hypertension: Secondary | ICD-10-CM

## 2018-09-04 ENCOUNTER — Other Ambulatory Visit: Payer: Self-pay

## 2018-09-04 ENCOUNTER — Ambulatory Visit: Payer: Medicare Other | Admitting: Internal Medicine

## 2018-09-04 ENCOUNTER — Encounter: Payer: Self-pay | Admitting: Internal Medicine

## 2018-09-04 VITALS — BP 114/72 | HR 77 | Temp 98.5°F | Ht 61.8 in | Wt 165.8 lb

## 2018-09-04 DIAGNOSIS — Z79899 Other long term (current) drug therapy: Secondary | ICD-10-CM | POA: Diagnosis not present

## 2018-09-04 DIAGNOSIS — M79641 Pain in right hand: Secondary | ICD-10-CM | POA: Diagnosis not present

## 2018-09-04 DIAGNOSIS — I10 Essential (primary) hypertension: Secondary | ICD-10-CM

## 2018-09-04 DIAGNOSIS — Z23 Encounter for immunization: Secondary | ICD-10-CM

## 2018-09-04 DIAGNOSIS — M79642 Pain in left hand: Secondary | ICD-10-CM

## 2018-09-04 MED ORDER — ZOSTER VAC RECOMB ADJUVANTED 50 MCG/0.5ML IM SUSR: 0.5000 mL | Freq: Once | INTRAMUSCULAR | 1 refills | Status: AC

## 2018-09-04 MED ORDER — LOSARTAN POTASSIUM 50 MG PO TABS: 50.0000 mg | ORAL_TABLET | Freq: Every day | ORAL | 1 refills | Status: DC

## 2018-09-04 MED ORDER — TRAMADOL HCL 50 MG PO TABS: 50.0000 mg | ORAL_TABLET | Freq: Every evening | ORAL | 0 refills | Status: DC | PRN

## 2018-09-05 LAB — ABO AND RH: Rh Factor: POSITIVE

## 2018-09-05 LAB — BMP8+EGFR
BUN/Creatinine Ratio: 11 — ABNORMAL LOW (ref 12–28)
BUN: 10 mg/dL (ref 8–27)
CO2: 25 mmol/L (ref 20–29)
Calcium: 9.8 mg/dL (ref 8.7–10.3)
Chloride: 96 mmol/L (ref 96–106)
Creatinine, Ser: 0.89 mg/dL (ref 0.57–1.00)
GFR calc Af Amer: 72 mL/min/{1.73_m2} (ref >59)
GFR calc non Af Amer: 63 mL/min/{1.73_m2} (ref >59)
Glucose: 97 mg/dL (ref 65–99)
Potassium: 4.2 mmol/L (ref 3.5–5.2)
Sodium: 133 mmol/L — ABNORMAL LOW (ref 134–144)

## 2018-09-10 ENCOUNTER — Encounter: Payer: Self-pay | Admitting: Internal Medicine

## 2018-09-11 ENCOUNTER — Telehealth: Payer: Self-pay

## 2018-09-11 NOTE — Telephone Encounter (Signed)
Called pt to inform her to take both of her medications until someone called to tell her which medication the provider is going to change her to. Pt is understanding. Encouraged to call the office back to any concerns

## 2018-09-16 NOTE — Progress Notes (Signed)
Subjective:     [name] , female    DOB: 08-Nov-1940 , 78 y.o.   MRN: [id number]   Chief Complaint  Patient presents with  . Hypertension    HPI  She is here today for bp check.  At her last visit, she was started on losartan 26m daily. She has tolerated change in meds, without any adverse effects.     Past Medical History:  Diagnosis Date  . Arthritis    RA  . CAD (coronary artery disease)   . Fibroid, uterine   . Hypertension      Family History  Problem Relation Age of Onset  . Heart disease Mother   . Dementia Mother   . Parkinson's disease Father      Current Outpatient Medications:  .  aspirin EC 81 MG tablet, Take 81 mg by mouth daily., Disp: , Rfl:  .  benzonatate (TESSALON) 100 MG capsule, TAKE ONE CAPSULE BY MOUTH 3 TIMES A DAY AS NEEDED FOR COUGH, Disp: 30 capsule, Rfl: 1 .  carvedilol (COREG) 6.25 MG tablet, TAKE 1 TABLET BY MOUTH TWICE A DAY WITH FOOD, Disp: 180 tablet, Rfl: 0 .  cholecalciferol (VITAMIN D) 1000 units tablet, Take 1,000 Units by mouth daily., Disp: , Rfl:  .  furosemide (LASIX) 20 MG tablet, TAKE 1 TABLET BY MOUTH EVERY DAY AS NEEDED, Disp: 90 tablet, Rfl: 1 .  mometasone (NASONEX) 50 MCG/ACT nasal spray, Place 2 sprays into the nose daily as needed (allergies)., Disp: , Rfl:  .  Multiple Vitamin (MULTIVITAMIN WITH MINERALS) TABS tablet, Take 1 tablet by mouth daily., Disp: , Rfl:  .  Omega-3 1000 MG CAPS, Take 1,000 mg by mouth daily., Disp: , Rfl:  .  Pramoxine-HC (HYDROCORTISONE ACE-PRAMOXINE) 2.5-1 % CREA, Apply 1 Syringe topically 3 (three) times daily as needed., Disp: 1 Tube, Rfl: 3 .  triamterene-hydrochlorothiazide (MAXZIDE-25) 37.5-25 MG tablet, TAKE 1 TABLET BY MOUTH EVERY DAY, Disp: 30 tablet, Rfl: 1 .  Besifloxacin HCl (BESIVANCE) 0.6 % SUSP, Apply to eye., Disp: , Rfl:  .  Bromfenac Sodium (PROLENSA) 0.07 % SOLN, Apply to eye., Disp: , Rfl:  .  Difluprednate (DUREZOL) 0.05 % EMUL, Apply to eye., Disp: ,  Rfl:  .  losartan (COZAAR) 50 MG tablet, Take 1 tablet (50 mg total) by mouth daily., Disp: 90 tablet, Rfl: 1 .  traMADol (ULTRAM) 50 MG tablet, Take 1 tablet (50 mg total) by mouth at bedtime as needed for severe pain., Disp: 30 tablet, Rfl: 0   No Known Allergies   Review of Systems  Constitutional: Negative.   Respiratory: Negative.   Cardiovascular: Negative.   Gastrointestinal: Negative.   Musculoskeletal: Positive for arthralgias.       Still having b/l hand pain. Hands are quite stiff upon awakening.   Neurological: Negative.   Psychiatric/Behavioral: Negative.      Today's Vitals   09/04/18 1206  BP: 114/72  Pulse: 77  Temp: 98.5 F (36.9 C)  TempSrc: Oral  Weight: 165 lb 12.8 oz (75.2 kg)  Height: 5' 1.8" (1.57 m)  PainSc: 0-No pain   Body mass index is 30.52 kg/m.   Objective:  Physical Exam Vitals signs and nursing note reviewed.  Constitutional:      Appearance: Normal appearance.  HENT:     Head: Normocephalic and atraumatic.  Cardiovascular:     Rate and Rhythm: Normal rate and regular rhythm.     Heart sounds: Normal heart sounds.  Pulmonary:  Effort: Pulmonary effort is normal.     Breath sounds: Normal breath sounds.  Skin:    General: Skin is warm.  Neurological:     General: No focal deficit present.     Mental Status: She is alert.  Psychiatric:        Mood and Affect: Mood normal.        Behavior: Behavior normal.         Assessment And Plan:     1. Essential hypertension, benign  Much improved control. She will continue with losartan 53m daily. Once she runs out of maxzide, I will start her on hctz 12.571mdaily. She is in agreement with her treatment plan. I will check BMET today. I will also check blood type as per her request.   - ABO AND RH   2. Pain in both hands  I will refer her to Rheum for further evaluation. She is in agreement with her treatment plan.  - Ambulatory referral to Rheumatology  3. Drug therapy  -  BMP8+EGFR  4. Need for vaccination  Rx Shingrix vaccine was sent to the pharmacy as requested.   RoMaximino GreenlandMD    THE PATIENT IS ENCOURAGED TO PRACTICE SOCIAL DISTANCING DUE TO THE COVID-19 PANDEMIC.

## 2018-10-01 NOTE — Progress Notes (Signed)
Office Visit Note  Patient: Tina Ware             Date of Birth: [birth year]           MRN: [id number]             PCP: Glendale Chard, MD Referring: Glendale Chard, MD Visit Date: 10/10/2018 Occupation: Teacher  Subjective:  New Patient (Initial Visit) (Bilateral hand numbness, Right middle trigger finger)   History of Present Illness: Tina Ware is a 78 y.o. female seen in consultation per request of her PCP.  According to patient she has had numbness in her bilateral hands off and on for several years.  She does not have much discomfort in her hands.  She states the discomfort in her hands is only occasional.  She denies any history of swelling in her hands.  She complains of her right middle trigger finger.  She states she has been going to a chiropractor who felt that the numbness could be coming from the nerves in her neck.  She states massage therapy and the exercises do help to some extent.  She has some pain and stiffness in her right knee joint.  She has had x-ray in the past which showed mild osteoarthritis of her knee joint.  None of the other joints are painful.  There is no family history of autoimmune disease.  Labs done recently at Dr. [facility] office showed rheumatoid factor 61.9.  Anti-CCP was negative.  Activities of Daily Living:  Patient reports morning stiffness for 24 hours.   Patient Reports nocturnal pain.  Difficulty dressing/grooming: Denies Difficulty climbing stairs: Denies Difficulty getting out of chair: Denies Difficulty using hands for taps, buttons, cutlery, and/or writing: Reports  Review of Systems  Constitutional: Positive for fatigue. Negative for night sweats, weight gain and weight loss.  HENT: Positive for mouth dryness. Negative for mouth sores, trouble swallowing, trouble swallowing and nose dryness.   Eyes: Positive for dryness. Negative for pain, redness and visual disturbance.  Respiratory: Negative for cough, shortness of  breath and difficulty breathing.   Cardiovascular: Positive for swelling in legs/feet. Negative for chest pain, palpitations, hypertension and irregular heartbeat.  Gastrointestinal: Positive for constipation. Negative for blood in stool and diarrhea.  Endocrine: Negative for cold intolerance and increased urination.  Genitourinary: Negative for difficulty urinating and vaginal dryness.  Musculoskeletal: Positive for arthralgias, joint pain and morning stiffness. Negative for joint swelling, myalgias, muscle weakness, muscle tenderness and myalgias.  Skin: Negative for color change, rash, hair loss, skin tightness, ulcers and sensitivity to sunlight.  Allergic/Immunologic: Negative for susceptible to infections.  Neurological: Positive for numbness and weakness. Negative for dizziness, memory loss and night sweats.  Hematological: Negative for bruising/bleeding tendency and swollen glands.  Psychiatric/Behavioral: Negative for depressed mood and sleep disturbance. The patient is not nervous/anxious.     PMFS History:  Patient Active Problem List   Diagnosis Date Noted  . Localized edema 02/12/2018  . Essential hypertension 02/12/2018  . Chronic pain of right knee 02/12/2018  . Abdominal pain 05/03/2017    Past Medical History:  Diagnosis Date  . Arthritis    RA  . CAD (coronary artery disease)   . Fibroid, uterine   . Hypertension     Family History  Problem Relation Age of Onset  . Heart disease Mother   . Dementia Mother   . Parkinson's disease Father   . Pancreatic cancer Sister    Past Surgical History:  Procedure Laterality Date  .  CATARACT EXTRACTION, BILATERAL    . CHOLECYSTECTOMY     Social History   Social History Narrative  . Not on file   Immunization History  Administered Date(s) Administered  . Influenza, High Dose Seasonal PF 01/15/2018  . Tdap 07/06/2017     Objective: Vital Signs: BP 119/64 (BP Location: Right Arm, Patient Position: Sitting, Cuff  Size: Normal)   Pulse 79   Resp 16   Ht 5' 2.25" (1.581 m)   Wt 167 lb 3.2 oz (75.8 kg)   BMI 30.34 kg/m    Physical Exam Vitals signs and nursing note reviewed.  Constitutional:      Appearance: She is well-developed.  HENT:     Head: Normocephalic and atraumatic.  Eyes:     Conjunctiva/sclera: Conjunctivae normal.  Neck:     Musculoskeletal: Normal range of motion.  Cardiovascular:     Rate and Rhythm: Normal rate and regular rhythm.     Heart sounds: Normal heart sounds.     Comments: Bilateral pedal edema Pulmonary:     Effort: Pulmonary effort is normal.     Breath sounds: Normal breath sounds.  Abdominal:     General: Bowel sounds are normal.     Palpations: Abdomen is soft.  Lymphadenopathy:     Cervical: No cervical adenopathy.  Skin:    General: Skin is warm and dry.     Capillary Refill: Capillary refill takes less than 2 seconds.  Neurological:     Mental Status: She is alert and oriented to person, place, and time.  Psychiatric:        Behavior: Behavior normal.      Musculoskeletal Exam: C-spine thoracic and lumbar spine with good range of motion.  No radiculopathy was noted.  Shoulder joints and elbow joints with good range of motion with no synovitis.  Wrist joints with good range of motion.  She had no tenderness or swelling over MCPs.  Mild PIP and DIP thickening was noted.  No synovitis was noted.  She has some flexor tenosynovitis of her right third finger.  Hip joints, knee joints, ankles MTPs and PIPs with good range of motion with no synovitis.  CDAI Exam: CDAI Score: - Patient Global: -; Provider Global: - Swollen: -; Tender: - Joint Exam   No joint exam has been documented for this visit   There is currently no information documented on the homunculus. Go to the Rheumatology activity and complete the homunculus joint exam.  Investigation: Findings:  06/18/18: ANA negative, CCP 8, RF 61.9, uric acid 4.2, sed rate 8, vitamin B12  57.3  Component     Latest Ref Rng & Units 06/18/2018  Vitamin B12     232 - 1,245 pg/mL 573  ANA Titer 1      Negative  Cyclic Citrullin Peptide Ab     0 - 19 units 8  RA Latex Turbid.     0.0 - 13.9 IU/mL 61.9 (H)  Sed Rate     0 - 40 mm/hr 8  Uric Acid     2.5 - 7.1 mg/dL 4.2   Imaging: Xr Hand 2 View Left  Result Date: 10/10/2018 CMC, PIP and DIP narrowing was noted.  No MCP intercarpal radiocarpal joint space narrowing was noted.  No erosive changes were noted. Impression: These findings are consistent with osteoarthritis of the hand.  Xr Hand 2 View Right  Result Date: 10/10/2018 CMC, PIP and DIP narrowing was noted.  No MCP intercarpal radiocarpal joint space narrowing  was noted.  No erosive changes were noted. Impression: These findings are consistent with osteoarthritis of the hand.   Recent Labs: Lab Results  Component Value Date   WBC 5.4 05/02/2017   HGB 16.1 (H) 05/02/2017   PLT 138 (L) 05/02/2017   NA 133 (L) 09/04/2018   K 4.2 09/04/2018   CL 96 09/04/2018   CO2 25 09/04/2018   GLUCOSE 97 09/04/2018   BUN 10 09/04/2018   CREATININE 0.89 09/04/2018   BILITOT 0.9 04/29/2017   ALKPHOS 67 04/29/2017   AST 29 04/29/2017   ALT 17 04/29/2017   PROT 7.2 04/29/2017   ALBUMIN 3.8 04/29/2017   CALCIUM 9.8 09/04/2018   GFRAA 72 09/04/2018    Speciality Comments: No specialty comments available.  Procedures:  No procedures performed Allergies: Patient has no known allergies.   Assessment / Plan:     Visit Diagnoses:  1. Pain in both hands -patient complains of pain and discomfort in her bilateral hands.  Her main concern is numbness in her bilateral hands.  I will schedule nerve conduction velocities to evaluate.  Clinical findings are consistent with osteoarthritis.  Radiographic findings are consistent with osteoarthritis.  She has positive rheumatoid factor but no synovitis on examination.  Anti-CCP was negative.  And sedimentation rate was within normal  limits.  I have advised patient to contact us in case she develops increased swelling in the future.  2. Trigger finger, right middle finger -use of Voltaren gel topically was discussed.  Use of natural anti-inflammatories was discussed.  3. Rheumatoid factor positive -if she develops any increased swelling in the future she should notify us.  4. Chronic pain of right knee -I reviewed her x-rays from 2017 which revealed only mild osteoarthritis.  5. Essential hypertension -she is on multiple medications.  6. Osteopenia of multiple sites -she takes calcium with vitamin D.     Orders: Orders Placed This Encounter  Procedures  . XR Hand 2 View Right  . XR Hand 2 View Left  . Ambulatory referral to Physical Medicine Rehab   No orders of the defined types were placed in this encounter.   Face-to-face time spent with patient was 45 minutes. Greater than 50% of time was spent in counseling and coordination of care.  Follow-Up Instructions: Return for Osteoarthritis, +RF.   Bo Merino, MD  Note - This record has been created using Editor, commissioning.  Chart creation errors have been sought, but may not always  have been located. Such creation errors do not reflect on  the standard of medical care.

## 2018-10-02 ENCOUNTER — Other Ambulatory Visit: Payer: Self-pay | Admitting: Nurse Practitioner

## 2018-10-10 ENCOUNTER — Other Ambulatory Visit: Payer: Self-pay

## 2018-10-10 ENCOUNTER — Ambulatory Visit: Payer: Self-pay

## 2018-10-10 ENCOUNTER — Ambulatory Visit (INDEPENDENT_AMBULATORY_CARE_PROVIDER_SITE_OTHER): Payer: Medicare Other | Admitting: Rheumatology

## 2018-10-10 ENCOUNTER — Encounter: Payer: Self-pay | Admitting: Rheumatology

## 2018-10-10 VITALS — BP 119/64 | HR 79 | Resp 16 | Ht 62.25 in | Wt 167.2 lb

## 2018-10-10 DIAGNOSIS — M79641 Pain in right hand: Secondary | ICD-10-CM | POA: Diagnosis not present

## 2018-10-10 DIAGNOSIS — M79642 Pain in left hand: Secondary | ICD-10-CM | POA: Diagnosis not present

## 2018-10-10 DIAGNOSIS — R768 Other specified abnormal immunological findings in serum: Secondary | ICD-10-CM | POA: Diagnosis not present

## 2018-10-10 DIAGNOSIS — M8589 Other specified disorders of bone density and structure, multiple sites: Secondary | ICD-10-CM

## 2018-10-10 DIAGNOSIS — G8929 Other chronic pain: Secondary | ICD-10-CM

## 2018-10-10 DIAGNOSIS — M65331 Trigger finger, right middle finger: Secondary | ICD-10-CM | POA: Diagnosis not present

## 2018-10-10 DIAGNOSIS — R202 Paresthesia of skin: Secondary | ICD-10-CM | POA: Diagnosis not present

## 2018-10-10 DIAGNOSIS — M25561 Pain in right knee: Secondary | ICD-10-CM

## 2018-10-10 DIAGNOSIS — I10 Essential (primary) hypertension: Secondary | ICD-10-CM

## 2018-10-10 NOTE — Progress Notes (Signed)
Pharmacy Note  Subjective:  Patient presents today to the [facility] to see Dr. Estanislado Pandy.   Patient seen by the pharmacist for counseling on natural anti-inflammatories and Voltaren gel for osteoarthritis/trigger finger.  Objective: Current Outpatient Medications on File Prior to Visit  Medication Sig Dispense Refill  . aspirin EC 81 MG tablet Take 81 mg by mouth daily.    . benzonatate (TESSALON) 100 MG capsule TAKE ONE CAPSULE BY MOUTH 3 TIMES A DAY AS NEEDED FOR COUGH 30 capsule 1  . Besifloxacin HCl (BESIVANCE) 0.6 % SUSP Apply to eye.    . Bromfenac Sodium (PROLENSA) 0.07 % SOLN Apply to eye.    . carvedilol (COREG) 6.25 MG tablet TAKE 1 TABLET BY MOUTH TWICE A DAY WITH FOOD 180 tablet 0  . cholecalciferol (VITAMIN D) 1000 units tablet Take 1,000 Units by mouth daily.    . Difluprednate (DUREZOL) 0.05 % EMUL Apply to eye.    . furosemide (LASIX) 20 MG tablet TAKE 1 TABLET BY MOUTH EVERY DAY AS NEEDED 90 tablet 1  . losartan (COZAAR) 50 MG tablet Take 1 tablet (50 mg total) by mouth daily. 90 tablet 1  . mometasone (NASONEX) 50 MCG/ACT nasal spray Place 2 sprays into the nose daily as needed (allergies).    . Multiple Vitamin (MULTIVITAMIN WITH MINERALS) TABS tablet Take 1 tablet by mouth daily.    . Omega-3 1000 MG CAPS Take 1,000 mg by mouth daily.    . Pramoxine-HC (HYDROCORTISONE ACE-PRAMOXINE) 2.5-1 % CREA Apply 1 Syringe topically 3 (three) times daily as needed. 1 Tube 3  . traMADol (ULTRAM) 50 MG tablet Take 1 tablet (50 mg total) by mouth at bedtime as needed for severe pain. 30 tablet 0  . triamterene-hydrochlorothiazide (MAXZIDE-25) 37.5-25 MG tablet TAKE 1 TABLET BY MOUTH EVERY DAY 30 tablet 1   No current facility-administered medications on file prior to visit.      Assessment/Plan:  Counseled on the purpose, proper use, and adverse effects of natural anti-inflammatories including upset stomach and increased bleeding risk.  Encouraged patient to add one  medication at a time and to include on medication list to monitor for adverse effects and drug interactions.  Given educational handout with recommended doses.  Has patient tried NSAID's previously?  Yes  Patient on the purpose, proper use, and adverse effects of Voltaren gel including headache, increased blood pressure, and risk of GI bleed.  Instructed patient to avoid applying to open skin wound, or on areas of infection, rash, burn, or peeling skin.  Advised  patient wait at least 10 minutes before dressing or wearing gloves and wait at least 1 hour before you bathe or shower.  Counseled patient to wash hands after application and avoid contact with face/eyes.  Advised patient to apply with q-tip if applying to hands to minimize absorption on palms. It is now available over the counter.

## 2018-10-10 NOTE — Patient Instructions (Signed)
Hand Exercises °Hand exercises can be helpful for almost anyone. These exercises can strengthen the hands, improve flexibility and movement, and increase blood flow to the hands. These results can make work and daily tasks easier. Hand exercises can be especially helpful for people who have joint pain from arthritis or have nerve damage from overuse (carpal tunnel syndrome). These exercises can also help people who have injured a hand. °Exercises °Most of these hand exercises are gentle stretching and motion exercises. It is usually safe to do them often throughout the day. Warming up your hands before exercise may help to reduce stiffness. You can do this with gentle massage or by placing your hands in warm water for 10-15 minutes. °It is normal to feel some stretching, pulling, tightness, or mild discomfort as you begin new exercises. This will gradually improve. Stop an exercise right away if you feel sudden, severe pain or your pain gets worse. Ask your health care provider which exercises are best for you. °Knuckle bend or "claw" fist °1. Stand or sit with your arm, hand, and all five fingers pointed straight up. Make sure to keep your wrist straight during the exercise. °2. Gently bend your fingers down toward your palm until the tips of your fingers are touching the top of your palm. Keep your big knuckle straight and just bend the small knuckles in your fingers. °3. Hold this position for __________ seconds. °4. Straighten (extend) your fingers back to the starting position. °Repeat this exercise 5-10 times with each hand. °Full finger fist °1. Stand or sit with your arm, hand, and all five fingers pointed straight up. Make sure to keep your wrist straight during the exercise. °2. Gently bend your fingers into your palm until the tips of your fingers are touching the middle of your palm. °3. Hold this position for __________ seconds. °4. Extend your fingers back to the starting position, stretching every  joint fully. °Repeat this exercise 5-10 times with each hand. °Straight fist °1. Stand or sit with your arm, hand, and all five fingers pointed straight up. Make sure to keep your wrist straight during the exercise. °2. Gently bend your fingers at the big knuckle, where your fingers meet your hand, and the middle knuckle. Keep the knuckle at the tips of your fingers straight and try to touch the bottom of your palm. °3. Hold this position for __________ seconds. °4. Extend your fingers back to the starting position, stretching every joint fully. °Repeat this exercise 5-10 times with each hand. °Tabletop °1. Stand or sit with your arm, hand, and all five fingers pointed straight up. Make sure to keep your wrist straight during the exercise. °2. Gently bend your fingers at the big knuckle, where your fingers meet your hand, as far down as you can while keeping the small knuckles in your fingers straight. Think of forming a tabletop with your fingers. °3. Hold this position for __________ seconds. °4. Extend your fingers back to the starting position, stretching every joint fully. °Repeat this exercise 5-10 times with each hand. °Finger spread °1. Place your hand flat on a table with your palm facing down. Make sure your wrist stays straight as you do this exercise. °2. Spread your fingers and thumb apart from each other as far as you can until you feel a gentle stretch. Hold this position for __________ seconds. °3. Bring your fingers and thumb tight together again. Hold this position for __________ seconds. °Repeat this exercise 5-10 times with each hand. °  Making circles °1. Stand or sit with your arm, hand, and all five fingers pointed straight up. Make sure to keep your wrist straight during the exercise. °2. Make a circle by touching the tip of your thumb to the tip of your index finger. °3. Hold for __________ seconds. Then open your hand wide. °4. Repeat this motion with your thumb and each finger on your  hand. °Repeat this exercise 5-10 times with each hand. °Thumb motion °1. Sit with your forearm resting on a table and your wrist straight. Your thumb should be facing up toward the ceiling. Keep your fingers relaxed as you move your thumb. °2. Lift your thumb up as high as you can toward the ceiling. Hold for __________ seconds. °3. Bend your thumb across your palm as far as you can, reaching the tip of your thumb for the small finger (pinkie) side of your palm. Hold for __________ seconds. °Repeat this exercise 5-10 times with each hand. °Grip strengthening ° °1. Hold a stress ball or other soft ball in the middle of your hand. °2. Slowly increase the pressure, squeezing the ball as much as you can without causing pain. Think of bringing the tips of your fingers into the middle of your palm. All of your finger joints should bend when doing this exercise. °3. Hold your squeeze for __________ seconds, then relax. °Repeat this exercise 5-10 times with each hand. °Contact a health care provider if: °· Your hand pain or discomfort gets much worse when you do an exercise. °· Your hand pain or discomfort does not improve within 2 hours after you exercise. °If you have any of these problems, stop doing these exercises right away. Do not do them again unless your health care provider says that you can. °Get help right away if: °· You develop sudden, severe hand pain or swelling. If this happens, stop doing these exercises right away. Do not do them again unless your health care provider says that you can. °This information is not intended to replace advice given to you by your health care provider. Make sure you discuss any questions you have with your health care provider. °Document Released: 02/23/2015 Document Revised: 07/05/2018 Document Reviewed: 03/15/2018 °[employer]vier Patient Education © 2020 [employer] ° °

## 2018-10-15 ENCOUNTER — Ambulatory Visit: Payer: Medicare Other | Admitting: Internal Medicine

## 2018-10-15 ENCOUNTER — Encounter: Payer: Self-pay | Admitting: Internal Medicine

## 2018-10-15 ENCOUNTER — Other Ambulatory Visit: Payer: Self-pay

## 2018-10-15 ENCOUNTER — Telehealth: Payer: Self-pay

## 2018-10-15 VITALS — BP 138/86 | HR 70 | Temp 98.4°F | Ht 60.8 in | Wt 164.4 lb

## 2018-10-15 DIAGNOSIS — Z6831 Body mass index (BMI) 31.0-31.9, adult: Secondary | ICD-10-CM

## 2018-10-15 DIAGNOSIS — E6609 Other obesity due to excess calories: Secondary | ICD-10-CM | POA: Diagnosis not present

## 2018-10-15 DIAGNOSIS — Z0000 Encounter for general adult medical examination without abnormal findings: Secondary | ICD-10-CM

## 2018-10-15 DIAGNOSIS — I10 Essential (primary) hypertension: Secondary | ICD-10-CM

## 2018-10-15 DIAGNOSIS — H6123 Impacted cerumen, bilateral: Secondary | ICD-10-CM | POA: Diagnosis not present

## 2018-10-15 LAB — POCT URINALYSIS DIPSTICK
Bilirubin, UA: NEGATIVE
Blood, UA: NEGATIVE
Glucose, UA: NEGATIVE
Ketones, UA: NEGATIVE
Nitrite, UA: NEGATIVE
Protein, UA: NEGATIVE
Spec Grav, UA: 1.015 (ref 1.010–1.025)
Urobilinogen, UA: 0.2 E.U./dL
pH, UA: 8.5 — AB (ref 5.0–8.0)

## 2018-10-15 LAB — POCT UA - MICROALBUMIN
Albumin/Creatinine Ratio, Urine, POC: <30
Creatinine, POC: 50 mg/dL
Microalbumin Ur, POC: 10 mg/L

## 2018-10-15 NOTE — Progress Notes (Signed)
Subjective:     [name] , female    DOB: Jan 11, 1941 , 78 y.o.   MRN: [id number]   Chief Complaint  Patient presents with  . Annual Exam  . Hypertension    HPI  She is here today for a full physical examination. She has no specific concerns at this time.   Hypertension This is a chronic problem. The current episode started more than 1 year ago. The problem has been gradually improving since onset. The problem is controlled. Pertinent negatives include no blurred vision, chest pain, palpitations or shortness of breath. Risk factors for coronary artery disease include obesity, post-menopausal state and sedentary lifestyle. Past treatments include angiotensin blockers and diuretics. The current treatment provides moderate improvement. Compliance problems include exercise.      Past Medical History:  Diagnosis Date  . Arthritis    RA  . CAD (coronary artery disease)   . Fibroid, uterine   . Hypertension      Family History  Problem Relation Age of Onset  . Heart disease Mother   . Dementia Mother   . Parkinson's disease Father   . Pancreatic cancer Sister      Current Outpatient Medications:  .  benzonatate (TESSALON) 100 MG capsule, TAKE ONE CAPSULE BY MOUTH 3 TIMES A DAY AS NEEDED FOR COUGH, Disp: 30 capsule, Rfl: 1 .  Besifloxacin HCl (BESIVANCE) 0.6 % SUSP, Apply to eye., Disp: , Rfl:  .  Bromfenac Sodium (PROLENSA) 0.07 % SOLN, Apply to eye., Disp: , Rfl:  .  carvedilol (COREG) 6.25 MG tablet, TAKE 1 TABLET BY MOUTH TWICE A DAY WITH FOOD, Disp: 180 tablet, Rfl: 0 .  cholecalciferol (VITAMIN D) 1000 units tablet, Take 1,000 Units by mouth daily., Disp: , Rfl:  .  Difluprednate (DUREZOL) 0.05 % EMUL, Apply to eye., Disp: , Rfl:  .  furosemide (LASIX) 20 MG tablet, TAKE 1 TABLET BY MOUTH EVERY DAY AS NEEDED, Disp: 90 tablet, Rfl: 1 .  losartan (COZAAR) 50 MG tablet, Take 1 tablet (50 mg total) by mouth daily., Disp: 90 tablet, Rfl: 1 .  mometasone  (NASONEX) 50 MCG/ACT nasal spray, Place 2 sprays into the nose daily as needed (allergies)., Disp: , Rfl:  .  Multiple Vitamin (MULTIVITAMIN WITH MINERALS) TABS tablet, Take 1 tablet by mouth daily., Disp: , Rfl:  .  Omega-3 1000 MG CAPS, Take 1,000 mg by mouth daily., Disp: , Rfl:  .  Pramoxine-HC (HYDROCORTISONE ACE-PRAMOXINE) 2.5-1 % CREA, Apply 1 Syringe topically 3 (three) times daily as needed., Disp: 1 Tube, Rfl: 3 .  aspirin EC 81 MG tablet, Take 81 mg by mouth daily., Disp: , Rfl:  .  traMADol (ULTRAM) 50 MG tablet, Take 1 tablet (50 mg total) by mouth at bedtime as needed for severe pain. (Patient not taking: Reported on 10/15/2018), Disp: 30 tablet, Rfl: 0   No Known Allergies   Review of Systems  Constitutional: Negative.   HENT: Negative.   Eyes: Negative.  Negative for blurred vision.  Respiratory: Negative.  Negative for shortness of breath.   Cardiovascular: Negative.  Negative for chest pain and palpitations.  Endocrine: Negative.   Genitourinary: Negative.   Musculoskeletal: Negative.   Skin: Negative.   Allergic/Immunologic: Negative.   Neurological: Negative.   Hematological: Negative.   Psychiatric/Behavioral: Negative.      Today's Vitals   10/15/18 1154  BP: 138/86  Pulse: 70  Temp: 98.4 F (36.9 C)  TempSrc: Oral  Weight: 164 lb 6.4  oz (74.6 kg)  Height: 5' 0.8" (1.544 m)   Body mass index is 31.27 kg/m.   Objective:  Physical Exam Vitals signs and nursing note reviewed.  Constitutional:      Appearance: Normal appearance.  HENT:     Head: Normocephalic and atraumatic.     Right Ear: Tympanic membrane, ear canal and external ear normal.     Left Ear: Tympanic membrane, ear canal and external ear normal.     Nose: Nose normal.     Mouth/Throat:     Mouth: Mucous membranes are moist.     Pharynx: Oropharynx is clear.  Eyes:     Extraocular Movements: Extraocular movements intact.     Conjunctiva/sclera: Conjunctivae normal.     Pupils: Pupils  are equal, round, and reactive to light.  Neck:     Musculoskeletal: Normal range of motion and neck supple.  Cardiovascular:     Rate and Rhythm: Normal rate and regular rhythm.     Pulses: Normal pulses.     Heart sounds: Normal heart sounds.  Pulmonary:     Effort: Pulmonary effort is normal.     Breath sounds: Normal breath sounds.  Chest:     Breasts: Tanner Score is 5.        Right: No swelling, bleeding, inverted nipple, mass, nipple discharge or skin change.        Left: Normal. No swelling, bleeding, inverted nipple, mass, nipple discharge or skin change.  Abdominal:     General: Abdomen is flat. Bowel sounds are normal.     Palpations: Abdomen is soft.  Genitourinary:    Comments: deferred Musculoskeletal: Normal range of motion.     Right lower leg: 2+ Pitting Edema present.     Left lower leg: 1+ Pitting Edema present.  Skin:    General: Skin is warm and dry.  Neurological:     General: No focal deficit present.     Mental Status: She is alert and oriented to person, place, and time.  Psychiatric:        Mood and Affect: Mood normal.        Behavior: Behavior normal.         Assessment And Plan:     1. Routine general medical examination at health care facility  A full exam was performed. Importance of monthly self breast exams was discussed with the patient. PATIENT HAS BEEN ADVISED TO GET 30-45 MINUTES REGULAR EXERCISE NO LESS THAN FOUR TO FIVE DAYS PER WEEK - BOTH WEIGHTBEARING EXERCISES AND AEROBIC ARE RECOMMENDED.  SHE IS ADVISED TO FOLLOW A HEALTHY DIET WITH AT LEAST SIX FRUITS/VEGGIES PER DAY, DECREASE INTAKE OF RED MEAT, AND TO INCREASE FISH INTAKE TO TWO DAYS PER WEEK.  MEATS/FISH SHOULD NOT BE FRIED, BAKED OR BROILED IS PREFERABLE.  I SUGGEST WEARING SPF 50 SUNSCREEN ON EXPOSED PARTS AND ESPECIALLY WHEN IN THE DIRECT SUNLIGHT FOR AN EXTENDED PERIOD OF TIME.  PLEASE AVOID FAST FOOD RESTAURANTS AND INCREASE YOUR WATER INTAKE.  - POCT Urinalysis Dipstick  (81002) - POCT UA - Microalbumin  2. Essential hypertension, benign  Improved, not yet at goal. Unfortunately, she picked up refill of Maxzide. She is reminded to stop this once she finishes this rx. Once complete, she will switch to furosemide 20mg  daily (thus far she has been taking prn). She agrees to return to office once she has been on furosemide 20mg  daily x 4 weeks. The furosemide should help to alleviate LE edema. EKG performed, no new  changes noted.   - EKG 12-Lead - POCT Urinalysis Dipstick (81002) - POCT UA - Microalbumin - CBC; Future - Lipid panel; Future - TSH; Future  3. Impacted cerumen of both ears  Hard, impacted cerumen found bilaterally on exam. AFTER OBTAINING VERBAL CONSENT, BOTH EARS WERE FLUSHED BY IRRIGATION. SHE TOLERATED PROCEDURE WELL WITHOUT ANY COMPLICATIONS. NO TM ABNORMALITIES WERE NOTED.  - Ear Lavage  4. Class 1 obesity due to excess calories with serious comorbidity and body mass index (BMI) of 31.0 to 31.9 in adult  Importance of achieving optimal weight to decrease risk of cardiovascular disease and cancers was discussed with the patient in full detail. She is encouraged to start slowly - start with 10 minutes twice daily at least three to four days per week and to gradually build to 30 minutes five days weekly. She was given tips to incorporate more activity into her daily routine - take stairs when possible, park farther away from grocery stores, etc.    [name], MD    THE PATIENT IS ENCOURAGED TO PRACTICE SOCIAL DISTANCING DUE TO THE COVID-19 PANDEMIC.

## 2018-10-15 NOTE — Patient Instructions (Signed)
Health Maintenance, Female Adopting a healthy lifestyle and getting preventive care are important in promoting health and wellness. Ask your health care provider about:  The right schedule for you to have regular tests and exams.  Things you can do on your own to prevent diseases and keep yourself healthy. What should I know about diet, weight, and exercise? Eat a healthy diet   Eat a diet that includes plenty of vegetables, fruits, low-fat dairy products, and lean protein.  Do not eat a lot of foods that are high in solid fats, added sugars, or sodium. Maintain a healthy weight Body mass index (BMI) is used to identify weight problems. It estimates body fat based on height and weight. Your health care provider can help determine your BMI and help you achieve or maintain a healthy weight. Get regular exercise Get regular exercise. This is one of the most important things you can do for your health. Most adults should:  Exercise for at least 150 minutes each week. The exercise should increase your heart rate and make you sweat (moderate-intensity exercise).  Do strengthening exercises at least twice a week. This is in addition to the moderate-intensity exercise.  Spend less time sitting. Even light physical activity can be beneficial. Watch cholesterol and blood lipids Have your blood tested for lipids and cholesterol at 77 years of age, then have this test every 5 years. Have your cholesterol levels checked more often if:  Your lipid or cholesterol levels are high.  You are older than 77 years of age.  You are at high risk for heart disease. What should I know about cancer screening? Depending on your health history and family history, you may need to have cancer screening at various ages. This may include screening for:  Breast cancer.  Cervical cancer.  Colorectal cancer.  Skin cancer.  Lung cancer. What should I know about heart disease, diabetes, and high blood  pressure? Blood pressure and heart disease  High blood pressure causes heart disease and increases the risk of stroke. This is more likely to develop in people who have high blood pressure readings, are of African descent, or are overweight.  Have your blood pressure checked: ? Every 3-5 years if you are 18-39 years of age. ? Every year if you are 40 years old or older. Diabetes Have regular diabetes screenings. This checks your fasting blood sugar level. Have the screening done:  Once every three years after age 40 if you are at a normal weight and have a low risk for diabetes.  More often and at a younger age if you are overweight or have a high risk for diabetes. What should I know about preventing infection? Hepatitis B If you have a higher risk for hepatitis B, you should be screened for this virus. Talk with your health care provider to find out if you are at risk for hepatitis B infection. Hepatitis C Testing is recommended for:  Everyone born from 1945 through 1965.  Anyone with known risk factors for hepatitis C. Sexually transmitted infections (STIs)  Get screened for STIs, including gonorrhea and chlamydia, if: ? You are sexually active and are younger than 77 years of age. ? You are older than 77 years of age and your health care provider tells you that you are at risk for this type of infection. ? Your sexual activity has changed since you were last screened, and you are at increased risk for chlamydia or gonorrhea. Ask your health care provider if   you are at risk.  Ask your health care provider about whether you are at high risk for HIV. Your health care provider may recommend a prescription medicine to help prevent HIV infection. If you choose to take medicine to prevent HIV, you should first get tested for HIV. You should then be tested every 3 months for as long as you are taking the medicine. Pregnancy  If you are about to stop having your period (premenopausal) and  you may become pregnant, seek counseling before you get pregnant.  Take 400 to 800 micrograms (mcg) of folic acid every day if you become pregnant.  Ask for birth control (contraception) if you want to prevent pregnancy. Osteoporosis and menopause Osteoporosis is a disease in which the bones lose minerals and strength with aging. This can result in bone fractures. If you are 65 years old or older, or if you are at risk for osteoporosis and fractures, ask your health care provider if you should:  Be screened for bone loss.  Take a calcium or vitamin D supplement to lower your risk of fractures.  Be given hormone replacement therapy (HRT) to treat symptoms of menopause. Follow these instructions at home: Lifestyle  Do not use any products that contain nicotine or tobacco, such as cigarettes, e-cigarettes, and chewing tobacco. If you need help quitting, ask your health care provider.  Do not use street drugs.  Do not share needles.  Ask your health care provider for help if you need support or information about quitting drugs. Alcohol use  Do not drink alcohol if: ? Your health care provider tells you not to drink. ? You are pregnant, may be pregnant, or are planning to become pregnant.  If you drink alcohol: ? Limit how much you use to 0-1 drink a day. ? Limit intake if you are breastfeeding.  Be aware of how much alcohol is in your drink. In the [place] one drink equals one 12 oz bottle of beer (355 mL), one 5 oz glass of wine (148 mL), or one 1 oz glass of hard liquor (44 mL). General instructions  Schedule regular health, dental, and eye exams.  Stay current with your vaccines.  Tell your health care provider if: ? You often feel depressed. ? You have ever been abused or do not feel safe at home. Summary  Adopting a healthy lifestyle and getting preventive care are important in promoting health and wellness.  Follow your health care provider's instructions about healthy  diet, exercising, and getting tested or screened for diseases.  Follow your health care provider's instructions on monitoring your cholesterol and blood pressure. This information is not intended to replace advice given to you by your health care provider. Make sure you discuss any questions you have with your health care provider. Document Released: 09/27/2010 Document Revised: 03/07/2018 Document Reviewed: 03/07/2018 Elsevier Patient Education  2020 [employer]  

## 2018-10-15 NOTE — Telephone Encounter (Signed)
Left the pt a message that I was calling to see if she could return to the office tomorrow to have lab work done that she was supposed to have done today.

## 2018-10-16 NOTE — Addendum Note (Signed)
Addended by: Steward Ros on: 10/16/2018 08:38 AM   Modules accepted: Orders

## 2018-10-17 LAB — CBC
Hematocrit: 41.7 % (ref 34.0–46.6)
Hemoglobin: 14.6 g/dL (ref 11.1–15.9)
MCH: 29.6 pg (ref 26.6–33.0)
MCHC: 35 g/dL (ref 31.5–35.7)
MCV: 84 fL (ref 79–97)
Platelets: 185 10*3/uL (ref 150–450)
RBC: 4.94 x10E6/uL (ref 3.77–5.28)
RDW: 13.2 % (ref 11.7–15.4)
WBC: 3.9 10*3/uL (ref 3.4–10.8)

## 2018-10-17 LAB — LIPID PANEL
Chol/HDL Ratio: 2 ratio (ref 0.0–4.4)
Cholesterol, Total: 176 mg/dL (ref 100–199)
HDL: 88 mg/dL (ref >39)
LDL Calculated: 79 mg/dL (ref 0–99)
Triglycerides: 44 mg/dL (ref 0–149)
VLDL Cholesterol Cal: 9 mg/dL (ref 5–40)

## 2018-10-17 LAB — TSH: TSH: 2.17 u[IU]/mL (ref 0.450–4.500)

## 2018-10-21 ENCOUNTER — Other Ambulatory Visit: Payer: Self-pay | Admitting: Nurse Practitioner

## 2018-10-22 ENCOUNTER — Encounter: Payer: Self-pay | Admitting: Rheumatology

## 2018-10-25 ENCOUNTER — Other Ambulatory Visit: Payer: Self-pay

## 2018-10-25 ENCOUNTER — Encounter: Payer: Self-pay | Admitting: Physical Medicine and Rehabilitation

## 2018-10-25 ENCOUNTER — Ambulatory Visit (INDEPENDENT_AMBULATORY_CARE_PROVIDER_SITE_OTHER): Payer: Medicare Other | Admitting: Physical Medicine and Rehabilitation

## 2018-10-25 DIAGNOSIS — R202 Paresthesia of skin: Secondary | ICD-10-CM | POA: Diagnosis not present

## 2018-10-25 NOTE — Progress Notes (Signed)
  Numeric Pain Rating Scale and Functional Assessment Average Pain 5   In the last MONTH (on 0-10 scale) has pain interfered with the following?  1. General activity like being  able to carry out your everyday physical activities such as walking, climbing stairs, carrying groceries, or moving a chair?  Rating(0)

## 2018-10-26 ENCOUNTER — Encounter: Payer: Self-pay | Admitting: Physical Medicine and Rehabilitation

## 2018-10-26 ENCOUNTER — Telehealth: Payer: Self-pay | Admitting: *Deleted

## 2018-10-26 NOTE — Progress Notes (Deleted)
   SHARANDA SHINAULT - 78 y.o. female MRN [id number]  Date of birth: 09-12-1940  Office Visit Note: Visit Date: 10/25/2018 PCP: Glendale Chard, MD Referred by: Glendale Chard, MD  Subjective: Chief Complaint  Patient presents with  . Right Hand - Numbness  . Left Hand - Numbness   HPI:  Mailen W Montville is a 78 y.o. female who comes in today HPI ROS Otherwise per HPI.  Assessment & Plan: Visit Diagnoses:  1. Paresthesia of skin     Plan: No additional findings.   Meds & Orders: No orders of the defined types were placed in this encounter.   Orders Placed This Encounter  Procedures  . NCV with EMG (electromyography)    Follow-up: No follow-ups on file.   Procedures: No procedures performed  No notes on file   Clinical History: No specialty comments available.     Objective:  VS:  HT:    WT:   BMI:     BP:   HR: bpm  TEMP: ( )  RESP:  Physical Exam  Ortho Exam Imaging: No results found.

## 2018-10-26 NOTE — Procedures (Signed)
EMG & NCV Findings: Evaluation of the left median motor and the right median motor nerves showed prolonged distal onset latency (L10.2, R19.4 ms), reduced amplitude (L1.9, R0.1 mV), and decreased conduction velocity (Elbow-Wrist, L43, R20 m/s).  The left median (across palm) sensory nerve showed no response (Wrist) and prolonged distal peak latency (Palm, 3.0 ms).  The right median (across palm) sensory nerve showed prolonged distal peak latency (Wrist, 6.6 ms), reduced amplitude (4.0 V), and prolonged distal peak latency (Palm, 5.5 ms).  All remaining nerves (as indicated in the following tables) were within normal limits.  Left vs. Right side comparison data for the median motor nerve indicates abnormal L-R latency difference (9.2 ms), abnormal L-R amplitude difference (94.7 %), and abnormal L-R velocity difference (Elbow-Wrist, 23 m/s).  The ulnar motor nerve indicates abnormal L-R velocity difference (A Elbow-B Elbow, 20 m/s).  All remaining left vs. right side differences were within normal limits.    Needle evaluation of the right abductor pollicis brevis muscle showed decreased insertional activity, increased spontaneous activity, and diminished recruitment.  All remaining muscles (as indicated in the following table) showed no evidence of electrical instability.    Impression: The above electrodiagnostic study is ABNORMAL and reveals evidence of:  1. A very severe right median nerve entrapment at the wrist (carpal tunnel syndrome) affecting sensory and motor components. The lesion is characterized by sensory and motor demyelination with evidence of significant axonal injury.   2. A severe left median nerve entrapment at the wrist (carpal tunnel syndrome) affecting sensory and motor components.    There is no significant electrodiagnostic evidence of any other focal nerve entrapment, brachial plexopathy or cervical radiculopathy.   Recommendations: 1.  Follow-up with referring physician. 2.   Continue current management of symptoms. 3.  Suggest surgical evaluation.  ___________________________ Laurence Spates FAAPMR Board Certified, American Board of Physical Medicine and Rehabilitation    Nerve Conduction Studies Anti Sensory Summary Table   Stim Site NR Peak (ms) Norm Peak (ms) P-T Amp (V) Norm P-T Amp Site1 Site2 Delta-P (ms) Dist (cm) Vel (m/s) Norm Vel (m/s)  Left Median Acr Palm Anti Sensory (2nd Digit)  31.6C  Wrist *NR  <3.6  >10 Wrist Palm  0.0    Palm    *3.0 <2.0 4.3         Right Median Acr Palm Anti Sensory (2nd Digit)  31.5C  Wrist    *6.6 <3.6 *4.0 >10 Wrist Palm 1.1 0.0    Palm    *5.5 <2.0 12.9         Left Radial Anti Sensory (Base 1st Digit)  31.8C  Wrist    2.1 <3.1 35.7  Wrist Base 1st Digit 2.1 0.0    Right Radial Anti Sensory (Base 1st Digit)  31.8C  Wrist    2.3 <3.1 22.5  Wrist Base 1st Digit 2.3 0.0    Left Ulnar Anti Sensory (5th Digit)  31.9C  Wrist    3.3 <3.7 19.4 >15.0 Wrist 5th Digit 3.3 14.0 42 >38  Right Ulnar Anti Sensory (5th Digit)  31.7C  Wrist    3.1 <3.7 22.7 >15.0 Wrist 5th Digit 3.1 14.0 45 >38   Motor Summary Table   Stim Site NR Onset (ms) Norm Onset (ms) O-P Amp (mV) Norm O-P Amp Site1 Site2 Delta-0 (ms) Dist (cm) Vel (m/s) Norm Vel (m/s)  Left Median Motor (Abd Poll Brev)  31.8C  Wrist    *10.2 <4.2 *1.9 >5 Elbow Wrist 4.4 19.0 *43 >50  Elbow    14.6  1.8         Right Median Motor (Abd Poll Brev)  32C  Wrist    *19.4 <4.2 *0.1 >5 Elbow Wrist 10.1 20.0 *20 >50  Elbow    29.5  0.1         Left Ulnar Motor (Abd Dig Min)  31.9C  Wrist    3.0 <4.2 7.9 >3 B Elbow Wrist 2.9 18.0 62 >53  B Elbow    5.9  8.2  A Elbow B Elbow 1.4 10.0 71 >53  A Elbow    7.3  7.5         Right Ulnar Motor (Abd Dig Min)  32C  Wrist    2.8 <4.2 8.7 >3 B Elbow Wrist 3.0 19.0 63 >53  B Elbow    5.8  9.0  A Elbow B Elbow 1.1 10.0 91 >53  A Elbow    6.9  8.8          EMG   Side Muscle Nerve Root Ins Act Fibs Psw Amp Dur Poly Recrt Int  Fraser Din Comment  Right Abd Poll Brev Median C8-T1 *Decr *3+ *3+ Nml Nml 0 *Reduced Nml few muap  Right 1stDorInt Ulnar C8-T1 Nml Nml Nml Nml Nml 0 Nml Nml   Right PronatorTeres Median C6-7 Nml Nml Nml Nml Nml 0 Nml Nml   Right Biceps Musculocut C5-6 Nml Nml Nml Nml Nml 0 Nml Nml   Right Deltoid Axillary C5-6 Nml Nml Nml Nml Nml 0 Nml Nml     Nerve Conduction Studies Anti Sensory Left/Right Comparison   Stim Site L Lat (ms) R Lat (ms) L-R Lat (ms) L Amp (V) R Amp (V) L-R Amp (%) Site1 Site2 L Vel (m/s) R Vel (m/s) L-R Vel (m/s)  Median Acr Palm Anti Sensory (2nd Digit)  31.6C  Wrist  *6.6   *4.0  Wrist Palm     Palm *3.0 *5.5 2.5 4.3 12.9 66.7       Radial Anti Sensory (Base 1st Digit)  31.8C  Wrist 2.1 2.3 0.2 35.7 22.5 37.0 Wrist Base 1st Digit     Ulnar Anti Sensory (5th Digit)  31.9C  Wrist 3.3 3.1 0.2 19.4 22.7 14.5 Wrist 5th Digit 42 45 3   Motor Left/Right Comparison   Stim Site L Lat (ms) R Lat (ms) L-R Lat (ms) L Amp (mV) R Amp (mV) L-R Amp (%) Site1 Site2 L Vel (m/s) R Vel (m/s) L-R Vel (m/s)  Median Motor (Abd Poll Brev)  31.8C  Wrist *10.2 *19.4 *9.2 *1.9 *0.1 *94.7 Elbow Wrist *43 *20 *23  Elbow 14.6 29.5 14.9 1.8 0.1 94.4       Ulnar Motor (Abd Dig Min)  31.9C  Wrist 3.0 2.8 0.2 7.9 8.7 9.2 B Elbow Wrist 62 63 1  B Elbow 5.9 5.8 0.1 8.2 9.0 8.9 A Elbow B Elbow 71 91 *20  A Elbow 7.3 6.9 0.4 7.5 8.8 14.8          Waveforms:

## 2018-10-26 NOTE — Telephone Encounter (Signed)
Attempted to contact and left message for patient to call the office.  

## 2018-10-26 NOTE — Telephone Encounter (Signed)
-----   Message from Bo Merino, MD sent at 10/26/2018 12:34 PM EDT ----- Patient has bilateral severe carpal tunnel syndrome.  She will need surgery.  Please ask patient if she has an orthopedic surgeon to operate on her.  Otherwise we can refer her to orthopedics. Bo Merino, MD  ----- Message ----- From: Magnus Sinning, MD Sent: 10/26/2018   6:29 AM EDT To: Bo Merino, MD  Pretty sever CTS/median nerve compression, needs to see surgeon. Thanks!

## 2018-10-26 NOTE — Progress Notes (Signed)
Tina Ware - 78 y.o. female MRN [id number]  Date of birth: 04-Aug-1940  Office Visit Note: Visit Date: 10/25/2018 PCP: Glendale Chard, MD Referred by: Glendale Chard, MD  Subjective: Chief Complaint  Patient presents with  . Right Hand - Numbness  . Left Hand - Numbness   HPI: Tina Ware is a 78 y.o. female who comes in today At the request of Dr. Cy Blamer for evaluation management and diagnosis of chronic history of bilateral hand pain with numbness and tingling as well as neck pain and right middle finger triggering.  She reports a history of numbness and cold sensations in both hands equally no real difference right and left.  She is right-hand dominant.  She feels like really all of the fingers are affected but the right middle finger is worse but this also triggers and she has had some trigger finger treatment in the past including injection.  She has had no prior electrodiagnostic studies.  She does endorse neck pain and feels like some of the symptoms in the hands are coming from her neck.  She has had chiropractic care for years with adjustments.  She reports that at times this chiropractic adjustments did help her hands.  She is noticed some weakness prickly right more than left with difficulty with small objects and buttons.  She reports intermittent nature of the symptoms over the years but more recent worsening to the point where it is fairly constant but worse with activity and position.  She does not really endorse symptoms shooting down the arms to the hands.  There are no imaging of the cervical spine that I can find at least in our record.  She likely had x-rays performed at the chiropractor.  She has not had any red flag complaints otherwise no unintended weight loss no fevers chills or night sweats.  She has been started on some anti-inflammatory medication by Dr. Estanislado Pandy.  She is still undergoing full work-up from a rheumatological standpoint.  She does not  note any swelling in the hands.  Review of Systems  Musculoskeletal: Positive for joint pain and neck pain.  Neurological: Positive for tingling.   Otherwise per HPI.  Assessment & Plan: Visit Diagnoses:  1. Paresthesia of skin     Plan: Impression: Patient likely has pretty significant median neuropathy at the wrist given the exam and clinical history.  We did perform electrodiagnostic study today and that is reviewed below.  As noted below we cannot fully rule out a cervical sensory predominant radiculopathy or distal radiculitis but I do not think this follows with her clinical exam and electrodiagnostic study.  She likely does have some cervical spondylosis which would be common for her age.  She can continue with the chiropractor as it seems to help.  If any further work-up was warranted MRI of the cervical spine could be performed.  As noted below as well she really needs to see 1 of the orthopedic surgeons for decompression of the carpal tunnel.  Unfortunately there is a level of damage that she may not get complete resolution of her symptoms.  This was thoroughly discussed with her today with all questions answered.  The above electrodiagnostic study is ABNORMAL and reveals evidence of:  1. A very severe right median nerve entrapment at the wrist (carpal tunnel syndrome) affecting sensory and motor components. The lesion is characterized by sensory and motor demyelination with evidence of significant axonal injury.   2. A severe left median nerve  entrapment at the wrist (carpal tunnel syndrome) affecting sensory and motor components.    There is no significant electrodiagnostic evidence of any other focal nerve entrapment, brachial plexopathy or cervical radiculopathy.   Recommendations: 1.  Follow-up with referring physician. 2.  Continue current management of symptoms. 3.  Suggest surgical evaluation.  Meds & Orders: No orders of the defined types were placed in this encounter.    Orders Placed This Encounter  Procedures  . NCV with EMG (electromyography)    Follow-up: Return for Cy Blamer, MD.   Procedures: No procedures performed  EMG & NCV Findings: Evaluation of the left median motor and the right median motor nerves showed prolonged distal onset latency (L10.2, R19.4 ms), reduced amplitude (L1.9, R0.1 mV), and decreased conduction velocity (Elbow-Wrist, L43, R20 m/s).  The left median (across palm) sensory nerve showed no response (Wrist) and prolonged distal peak latency (Palm, 3.0 ms).  The right median (across palm) sensory nerve showed prolonged distal peak latency (Wrist, 6.6 ms), reduced amplitude (4.0 V), and prolonged distal peak latency (Palm, 5.5 ms).  All remaining nerves (as indicated in the following tables) were within normal limits.  Left vs. Right side comparison data for the median motor nerve indicates abnormal L-R latency difference (9.2 ms), abnormal L-R amplitude difference (94.7 %), and abnormal L-R velocity difference (Elbow-Wrist, 23 m/s).  The ulnar motor nerve indicates abnormal L-R velocity difference (A Elbow-B Elbow, 20 m/s).  All remaining left vs. right side differences were within normal limits.    Needle evaluation of the right abductor pollicis brevis muscle showed decreased insertional activity, increased spontaneous activity, and diminished recruitment.  All remaining muscles (as indicated in the following table) showed no evidence of electrical instability.    Impression: The above electrodiagnostic study is ABNORMAL and reveals evidence of:  1. A very severe right median nerve entrapment at the wrist (carpal tunnel syndrome) affecting sensory and motor components. The lesion is characterized by sensory and motor demyelination with evidence of significant axonal injury.   2. A severe left median nerve entrapment at the wrist (carpal tunnel syndrome) affecting sensory and motor components.    There is no significant  electrodiagnostic evidence of any other focal nerve entrapment, brachial plexopathy or cervical radiculopathy.   Recommendations: 1.  Follow-up with referring physician. 2.  Continue current management of symptoms. 3.  Suggest surgical evaluation.  ___________________________ Laurence Spates FAAPMR Board Certified, American Board of Physical Medicine and Rehabilitation    Nerve Conduction Studies Anti Sensory Summary Table   Stim Site NR Peak (ms) Norm Peak (ms) P-T Amp (V) Norm P-T Amp Site1 Site2 Delta-P (ms) Dist (cm) Vel (m/s) Norm Vel (m/s)  Left Median Acr Palm Anti Sensory (2nd Digit)  31.6C  Wrist *NR  <3.6  >10 Wrist Palm  0.0    Palm    *3.0 <2.0 4.3         Right Median Acr Palm Anti Sensory (2nd Digit)  31.5C  Wrist    *6.6 <3.6 *4.0 >10 Wrist Palm 1.1 0.0    Palm    *5.5 <2.0 12.9         Left Radial Anti Sensory (Base 1st Digit)  31.8C  Wrist    2.1 <3.1 35.7  Wrist Base 1st Digit 2.1 0.0    Right Radial Anti Sensory (Base 1st Digit)  31.8C  Wrist    2.3 <3.1 22.5  Wrist Base 1st Digit 2.3 0.0    Left Ulnar Anti Sensory (5th Digit)  31.9C  Wrist    3.3 <3.7 19.4 >15.0 Wrist 5th Digit 3.3 14.0 42 >38  Right Ulnar Anti Sensory (5th Digit)  31.7C  Wrist    3.1 <3.7 22.7 >15.0 Wrist 5th Digit 3.1 14.0 45 >38   Motor Summary Table   Stim Site NR Onset (ms) Norm Onset (ms) O-P Amp (mV) Norm O-P Amp Site1 Site2 Delta-0 (ms) Dist (cm) Vel (m/s) Norm Vel (m/s)  Left Median Motor (Abd Poll Brev)  31.8C  Wrist    *10.2 <4.2 *1.9 >5 Elbow Wrist 4.4 19.0 *43 >50  Elbow    14.6  1.8         Right Median Motor (Abd Poll Brev)  32C  Wrist    *19.4 <4.2 *0.1 >5 Elbow Wrist 10.1 20.0 *20 >50  Elbow    29.5  0.1         Left Ulnar Motor (Abd Dig Min)  31.9C  Wrist    3.0 <4.2 7.9 >3 B Elbow Wrist 2.9 18.0 62 >53  B Elbow    5.9  8.2  A Elbow B Elbow 1.4 10.0 71 >53  A Elbow    7.3  7.5         Right Ulnar Motor (Abd Dig Min)  32C  Wrist    2.8 <4.2 8.7 >3 B Elbow Wrist  3.0 19.0 63 >53  B Elbow    5.8  9.0  A Elbow B Elbow 1.1 10.0 91 >53  A Elbow    6.9  8.8          EMG   Side Muscle Nerve Root Ins Act Fibs Psw Amp Dur Poly Recrt Int Fraser Din Comment  Right Abd Poll Brev Median C8-T1 *Decr *3+ *3+ Nml Nml 0 *Reduced Nml few muap  Right 1stDorInt Ulnar C8-T1 Nml Nml Nml Nml Nml 0 Nml Nml   Right PronatorTeres Median C6-7 Nml Nml Nml Nml Nml 0 Nml Nml   Right Biceps Musculocut C5-6 Nml Nml Nml Nml Nml 0 Nml Nml   Right Deltoid Axillary C5-6 Nml Nml Nml Nml Nml 0 Nml Nml     Nerve Conduction Studies Anti Sensory Left/Right Comparison   Stim Site L Lat (ms) R Lat (ms) L-R Lat (ms) L Amp (V) R Amp (V) L-R Amp (%) Site1 Site2 L Vel (m/s) R Vel (m/s) L-R Vel (m/s)  Median Acr Palm Anti Sensory (2nd Digit)  31.6C  Wrist  *6.6   *4.0  Wrist Palm     Palm *3.0 *5.5 2.5 4.3 12.9 66.7       Radial Anti Sensory (Base 1st Digit)  31.8C  Wrist 2.1 2.3 0.2 35.7 22.5 37.0 Wrist Base 1st Digit     Ulnar Anti Sensory (5th Digit)  31.9C  Wrist 3.3 3.1 0.2 19.4 22.7 14.5 Wrist 5th Digit 42 45 3   Motor Left/Right Comparison   Stim Site L Lat (ms) R Lat (ms) L-R Lat (ms) L Amp (mV) R Amp (mV) L-R Amp (%) Site1 Site2 L Vel (m/s) R Vel (m/s) L-R Vel (m/s)  Median Motor (Abd Poll Brev)  31.8C  Wrist *10.2 *19.4 *9.2 *1.9 *0.1 *94.7 Elbow Wrist *43 *20 *23  Elbow 14.6 29.5 14.9 1.8 0.1 94.4       Ulnar Motor (Abd Dig Min)  31.9C  Wrist 3.0 2.8 0.2 7.9 8.7 9.2 B Elbow Wrist 62 63 1  B Elbow 5.9 5.8 0.1 8.2 9.0 8.9 A Elbow B Elbow 71 91 *20  A Elbow 7.3 6.9 0.4 7.5 8.8 14.8          Waveforms:                  Clinical History: No specialty comments available.   She reports that she has never smoked. She has never used smokeless tobacco.  Recent Labs    06/18/18 1553  HGBA1C 5.7*  LABURIC 4.2    Objective:  VS:  HT:    WT:   BMI:     BP:   HR: bpm  TEMP: ( )  RESP:  Physical Exam Vitals signs and nursing note reviewed.  Constitutional:       General: She is not in acute distress.    Appearance: Normal appearance. She is well-developed.  HENT:     Head: Normocephalic and atraumatic.     Nose: Nose normal.     Mouth/Throat:     Mouth: Mucous membranes are moist.     Pharynx: Oropharynx is clear.  Eyes:     Conjunctiva/sclera: Conjunctivae normal.     Pupils: Pupils are equal, round, and reactive to light.  Neck:     Musculoskeletal: Normal range of motion and neck supple.  Cardiovascular:     Rate and Rhythm: Regular rhythm.  Pulmonary:     Effort: Pulmonary effort is normal. No respiratory distress.  Abdominal:     General: There is no distension.     Palpations: Abdomen is soft.     Tenderness: There is no guarding.  Musculoskeletal:        General: No swelling, tenderness or deformity.     Right lower leg: No edema.     Left lower leg: No edema.     Comments: Examination of the cervical spine shows a patient sits in a forward flexed cervical spine position.  Negative Spurling's.  Pain with active range of motion at end ranges of rotation.  Inspection of hands reveals atrophy of the right APB but no atrophy of the left APB or bilateral FDI or hand intrinsics. There is no swelling, color changes, allodynia or dystrophic changes. There is 5 out of 5 strength in the bilateral wrist extension, finger abduction and long finger flexion.  There is no synovitis noted.  There is intact sensation to light touch in all dermatomal and peripheral nerve distributions.  Subjectively she has some impaired sensation but it is somewhat patchy.  There is a negative Hoffmann's test bilaterally.  Skin:    General: Skin is warm and dry.     Findings: No erythema or rash.  Neurological:     General: No focal deficit present.     Mental Status: She is alert and oriented to person, place, and time.     Motor: No weakness or abnormal muscle tone.     Coordination: Coordination normal.     Gait: Gait normal.  Psychiatric:        Mood and  Affect: Mood normal.        Behavior: Behavior normal.        Thought Content: Thought content normal.     Ortho Exam Imaging: No results found.  Past Medical/Family/Surgical/Social History: Medications & Allergies reviewed per EMR, new medications updated. Patient Active Problem List   Diagnosis Date Noted  . Localized edema 02/12/2018  . Essential hypertension 02/12/2018  . Chronic pain of right knee 02/12/2018  . Abdominal pain 05/03/2017   Past Medical History:  Diagnosis Date  . Arthritis  RA  . CAD (coronary artery disease)   . Fibroid, uterine   . Hypertension    Family History  Problem Relation Age of Onset  . Heart disease Mother   . Dementia Mother   . Parkinson's disease Father   . Pancreatic cancer Sister    Past Surgical History:  Procedure Laterality Date  . CATARACT EXTRACTION, BILATERAL    . CHOLECYSTECTOMY     Social History   Occupational History  . Not on file  Tobacco Use  . Smoking status: Never Smoker  . Smokeless tobacco: Never Used  Substance and Sexual Activity  . Alcohol use: No    Alcohol/week: 0.0 standard drinks  . Drug use: No  . Sexual activity: Not Currently

## 2018-10-29 NOTE — Telephone Encounter (Signed)
Advised patient of results below. Patient verbalized understanding. Patient states she does not have an orthopedic surgeon. Patient would like to be scheduled with Dr. Durward Fortes.    Patient is scheduled for 11/13/2018 with Dr. Durward Fortes.

## 2018-10-31 ENCOUNTER — Other Ambulatory Visit: Payer: Self-pay | Admitting: Internal Medicine

## 2018-10-31 DIAGNOSIS — I10 Essential (primary) hypertension: Secondary | ICD-10-CM

## 2018-10-31 DIAGNOSIS — R600 Localized edema: Secondary | ICD-10-CM

## 2018-11-05 NOTE — Progress Notes (Signed)
Office Visit Note  Patient: Tina Ware             Date of Birth: 12-Jul-1940           MRN: [id number]             PCP: Glendale Chard, MD Referring: Glendale Chard, MD Visit Date: 11/14/2018 Occupation: @GUAROCC @  Subjective:  Pain and tingling in both hands..   History of Present Illness: Lashae Wollenberg Dungee is a 78 y.o. female with history of osteoarthritis and positive rheumatoid factor.  She states she notices some swelling in her hands and her feet which she relates to warmer weather.  She denies any joint pain.  She had nerve conduction velocities done which showed bilateral carpal tunnel syndrome.  She is scheduled to see Dr. Durward Fortes for possible carpal tunnel release.  She continues to have right middle trigger finger.  Activities of Daily Living:  Patient reports morning stiffness for 30 minutes.   Patient Denies nocturnal pain.  Difficulty dressing/grooming: Denies Difficulty climbing stairs: Denies Difficulty getting out of chair: Denies Difficulty using hands for taps, buttons, cutlery, and/or writing: Reports  Review of Systems  Constitutional: Positive for fatigue. Negative for night sweats, weight gain and weight loss.  HENT: Negative for mouth sores, trouble swallowing, trouble swallowing, mouth dryness and nose dryness.   Eyes: Negative for pain, redness, itching, visual disturbance and dryness.  Respiratory: Negative for cough, shortness of breath, wheezing and difficulty breathing.   Cardiovascular: Positive for swelling in legs/feet. Negative for chest pain, palpitations, hypertension and irregular heartbeat.  Gastrointestinal: Negative for abdominal pain, blood in stool, constipation and diarrhea.  Endocrine: Negative for increased urination.  Genitourinary: Negative for difficulty urinating, painful urination and vaginal dryness.  Musculoskeletal: Positive for morning stiffness. Negative for arthralgias, joint pain, joint swelling, myalgias, muscle  weakness, muscle tenderness and myalgias.  Skin: Negative for color change, rash, hair loss, redness, skin tightness, ulcers and sensitivity to sunlight.  Allergic/Immunologic: Negative for susceptible to infections.  Neurological: Positive for numbness. Negative for dizziness, light-headedness, headaches, memory loss, night sweats and weakness.  Hematological: Negative for bruising/bleeding tendency and swollen glands.  Psychiatric/Behavioral: Negative for depressed mood, confusion and sleep disturbance. The patient is not nervous/anxious.     PMFS History:  Patient Active Problem List   Diagnosis Date Noted  . Localized edema 02/12/2018  . Essential hypertension 02/12/2018  . Chronic pain of right knee 02/12/2018  . Abdominal pain 05/03/2017    Past Medical History:  Diagnosis Date  . Arthritis    RA  . CAD (coronary artery disease)   . Fibroid, uterine   . Hypertension     Family History  Problem Relation Age of Onset  . Heart disease Mother   . Dementia Mother   . Parkinson's disease Father   . Pancreatic cancer Sister    Past Surgical History:  Procedure Laterality Date  . CATARACT EXTRACTION, BILATERAL    . CHOLECYSTECTOMY     Social History   Social History Narrative  . Not on file   Immunization History  Administered Date(s) Administered  . Influenza, High Dose Seasonal PF 01/15/2018  . Tdap 07/06/2017     Objective: Vital Signs: BP (!) 148/89 (BP Location: Left Arm, Patient Position: Sitting, Cuff Size: Normal)   Pulse 76   Resp 13   Ht 5\' 2"  (1.575 m)   Wt 165 lb 6.4 oz (75 kg)   BMI 30.25 kg/m    Physical Exam Vitals signs  and nursing note reviewed.  Constitutional:      Appearance: She is well-developed.  HENT:     Head: Normocephalic and atraumatic.  Eyes:     Conjunctiva/sclera: Conjunctivae normal.  Neck:     Musculoskeletal: Normal range of motion.  Cardiovascular:     Rate and Rhythm: Normal rate and regular rhythm.     Heart  sounds: Normal heart sounds.  Pulmonary:     Effort: Pulmonary effort is normal.     Breath sounds: Normal breath sounds.  Abdominal:     General: Bowel sounds are normal.     Palpations: Abdomen is soft.  Lymphadenopathy:     Cervical: No cervical adenopathy.  Skin:    General: Skin is warm and dry.     Capillary Refill: Capillary refill takes less than 2 seconds.  Neurological:     Mental Status: She is alert and oriented to person, place, and time.  Psychiatric:        Behavior: Behavior normal.      Musculoskeletal Exam: C-spine thoracic and lumbar spine with good range of motion.  Shoulder joints elbow joints wrist joints.  Good range of motion.  She is in complete fist formation bilaterally due to PIP and DIP thickening.  No synovitis was noted on examination.  She has intermittent triggering of the right third middle finger.  Hip joints, knee joints, ankles and MTPs with good range of motion with no synovitis.  She had bilateral lower extremity edema.  CDAI Exam: CDAI Score: - Patient Global: -; Provider Global: - Swollen: -; Tender: - Joint Exam   No joint exam has been documented for this visit   There is currently no information documented on the homunculus. Go to the Rheumatology activity and complete the homunculus joint exam.  Investigation: No additional findings.  Imaging: No results found.  Recent Labs: Lab Results  Component Value Date   WBC 3.9 10/16/2018   HGB 14.6 10/16/2018   PLT 185 10/16/2018   NA 133 (L) 09/04/2018   K 4.2 09/04/2018   CL 96 09/04/2018   CO2 25 09/04/2018   GLUCOSE 97 09/04/2018   BUN 10 09/04/2018   CREATININE 0.89 09/04/2018   BILITOT 0.9 04/29/2017   ALKPHOS 67 04/29/2017   AST 29 04/29/2017   ALT 17 04/29/2017   PROT 7.2 04/29/2017   ALBUMIN 3.8 04/29/2017   CALCIUM 9.8 09/04/2018   GFRAA 72 09/04/2018    Speciality Comments: No specialty comments available.  Procedures:  No procedures performed Allergies:  Patient has no known allergies.   Assessment / Plan:     Visit Diagnoses: Rheumatoid factor positive -I had detailed discussion regarding positive rheumatoid factor.  Despite having a positive rheumatoid factor she does not have any synovitis on examination.  Trigger finger, right middle finger -she has intermittent triggering.  I will consider injecting her finger at the follow-up visit.  As she will be getting carpal tunnel release soon.  Primary osteoarthritis of both hands -joint protection muscle strengthening was discussed.  Bilateral carpal tunnel syndrome - NCV/EMG consistent with severe bilateral carpal tunnel syndrome.  She has appointment with Dr. Durward Fortes on November 13, 2018.  I reviewed the nerve conduction velocity results with the patient.  She had seen Dr. Durward Fortes yesterday and will be undergoing carpal tunnel release.  Primary osteoarthritis of right knee - mild.  She is currently not having much discomfort.  Essential hypertension -blood pressure is elevated.  Have advised her to monitor blood pressure  closely and follow-up with her PCP.  Osteopenia of multiple sites -she is on calcium and vitamin D.  Orders: No orders of the defined types were placed in this encounter.  No orders of the defined types were placed in this encounter.    Follow-Up Instructions: Return in about 4 months (around 03/16/2019) for Osteoarthritis.   Bo Merino, MD  Note - This record has been created using Editor, commissioning.  Chart creation errors have been sought, but may not always  have been located. Such creation errors do not reflect on  the standard of medical care.

## 2018-11-07 ENCOUNTER — Other Ambulatory Visit: Payer: Self-pay

## 2018-11-07 NOTE — Telephone Encounter (Signed)
I was calling the pt to remind her that she's to not take the triamterene/hctz anymore she's to switch to Furosemide 20 mg and to come for a f/u because the pharmacy sent the office a refill request.   The pt said that she remembers to not get the maxide refilled when she runs out

## 2018-11-13 ENCOUNTER — Other Ambulatory Visit: Payer: Self-pay

## 2018-11-13 ENCOUNTER — Ambulatory Visit (INDEPENDENT_AMBULATORY_CARE_PROVIDER_SITE_OTHER): Payer: Medicare Other | Admitting: Orthopaedic Surgery

## 2018-11-13 ENCOUNTER — Encounter: Payer: Self-pay | Admitting: Orthopaedic Surgery

## 2018-11-13 VITALS — BP 169/90 | HR 77 | Ht 62.0 in | Wt 164.0 lb

## 2018-11-13 DIAGNOSIS — M65341 Trigger finger, right ring finger: Secondary | ICD-10-CM | POA: Diagnosis not present

## 2018-11-13 DIAGNOSIS — G5601 Carpal tunnel syndrome, right upper limb: Secondary | ICD-10-CM

## 2018-11-13 DIAGNOSIS — G5602 Carpal tunnel syndrome, left upper limb: Secondary | ICD-10-CM

## 2018-11-13 NOTE — Progress Notes (Signed)
Office Visit Note   Patient: Tina Ware           Date of Birth: 01-Oct-1940           MRN: [id number] Visit Date: 11/13/2018              Requested by: Glendale Chard, [address] [place],  [place] [address] PCP: Glendale Chard, MD   Assessment & Plan: Visit Diagnoses:  1. Carpal tunnel syndrome of right wrist   2. Trigger finger, right ring finger   3. Carpal tunnel syndrome, left upper limb     Plan:  #1: Right median nerve neural lysis with right long finger trigger finger release #2: The operation was explained to her in the complications as well.  Explained also on the fact that this is been going on a fair amount of time and the signs of damage to the nerve are quite extensive as noted as a very severe compression on the median nerve.  She is understanding that this may not completely resolve or be as beneficial as she would like but she like to proceed with this.    Follow-Up Instructions: No follow-ups on file.    Face-to-face time spent with patient was greater than 40 minutes.  Greater than 50% of the time was spent in counseling and coordination of care.   Orders:  No orders of the defined types were placed in this encounter.  No orders of the defined types were placed in this encounter.     Procedures: No procedures performed with stick around longer than that very rare that actually go 6hours think that when it will try to move him presents once medically  Clinical Data: No additional findings.   Subjective: Chief Complaint  Patient presents with  . Left Wrist - Pain  . Right Wrist - Pain  Patient presents today for bilateral hand numbness . Her right side is worse than the left. She said it started about 6 years ago, but is worse now. She said that both hands are numb, and it seems to lately be constant. She saw Dr.Deveshwar and she ordered an EMG. She had her EMG with Dr.Newton on 10/25/2018.  HPI  Tina Ware is a very pleasant  78 year old African-American female who presents with a chronic history of numbness and tingling in both hands right worse than left as well as triggering in the right long finger.  She states is gone on for multiple years.  She does a lot of typing.  She is also being seen by Dr. Colon Flattery is placed on anti-inflammatory medication.  She was seen by Dr. Ernestina Patches who performed EMGs at the request of Dr. Colon Flattery and she comes in today with bilateral median nerve compression right worse than left.  He grades this as severe median nerve entrapment on the left and very severe on the right.  She had been referred to [place] for evaluation and possible operative intervention.   Review of Systems  Constitutional: Negative for fatigue.  HENT: Negative for ear pain.   Eyes: Negative for pain.  Respiratory: Negative for shortness of breath.   Cardiovascular: Positive for leg swelling.  Gastrointestinal: Positive for constipation. Negative for diarrhea.  Endocrine: Negative for cold intolerance and heat intolerance.  Genitourinary: Negative for difficulty urinating.  Musculoskeletal: Negative for joint swelling.  Skin: Negative for rash.  Allergic/Immunologic: Negative for food allergies.  Neurological: Negative for weakness.  Hematological: Does not bruise/bleed easily.  Psychiatric/Behavioral: Negative  for sleep disturbance.     Objective: Vital Signs: BP (!) 169/90   Pulse 77   Ht 5\' 2"  (1.575 m)   Wt 164 lb (74.4 kg)   BMI 30.00 kg/m   Physical Exam Constitutional:      Appearance: Normal appearance. She is well-developed. She is obese.  HENT:     Head: Normocephalic.  Eyes:     Pupils: Pupils are equal, round, and reactive to light.  Pulmonary:     Effort: Pulmonary effort is normal.  Skin:    General: Skin is warm and dry.  Neurological:     Mental Status: She is alert and oriented to person, place, and time.  Psychiatric:        Mood and Affect: Mood normal.        Behavior:  Behavior normal.        Thought Content: Thought content normal.        Judgment: Judgment normal.     Ortho Exam  Exam today reveals bilateral Tinel sign at the median nerve.  She does have good capillary refill.  She does have triggering of her right long finger and I can feel a nodule.  However she has inability to fully straighten the PIP joint of the long finger.  Decreased sensation is noted in the hands bilaterally.  Specialty Comments:  No specialty comments available.  Imaging: No results found.  Evaluation of the left median motor and the right median motor nerves showed prolonged distal onset latency (L10.2, R19.4 ms), reduced amplitude (L1.9, R0.1 mV), and decreased conduction velocity (Elbow-Wrist, L43, R20 m/s). The left median (across palm) sensory nerve showed no response (Wrist) and prolonged distal peak latency (Palm, 3.0 ms). The right median (across palm) sensory nerve showed prolonged distal peak latency (Wrist, 6.6 ms), reduced amplitude (4.0 V), and prolonged distal peak latency (Palm, 5.5 ms). All remaining nerves (as indicated in the following tables) were within normal limits. Left vs. Right side comparison data for the median motor nerve indicates abnormal L-R latency difference (9.2 ms), abnormal L-R amplitude difference (94.7 %), and abnormal L-R velocity difference (Elbow-Wrist, 23 m/s). The ulnar motor nerve indicates abnormal L-R velocity difference (A Elbow-B Elbow, 20 m/s). All remaining left vs. right side differences were within normal limits.  Needle evaluation of the right abductor pollicis brevis muscle showed decreased insertional activity, increased spontaneous activity, and diminished recruitment. All remaining muscles (as indicated in the following table) showed no evidence of electrical instability.  Impression:  The above electrodiagnostic study is ABNORMAL and reveals evidence of:  1. A very severe right median nerve entrapment at the wrist (carpal  tunnel syndrome) affecting sensory and motor components. The lesion is characterized by sensory and motor demyelination with evidence of significant axonal injury.  2. A severe left median nerve entrapment at the wrist (carpal tunnel syndrome) affecting sensory and motor components.  There is no significant electrodiagnostic evidence of any other focal nerve entrapment, brachial plexopathy or cervical radiculopathy.    PMFS History: Current Outpatient Medications  Medication Sig Dispense Refill  . aspirin EC 81 MG tablet Take 81 mg by mouth daily.    . benzonatate (TESSALON) 100 MG capsule TAKE ONE CAPSULE BY MOUTH 3 TIMES A DAY AS NEEDED FOR COUGH 30 capsule 1  . Besifloxacin HCl (BESIVANCE) 0.6 % SUSP Apply to eye.    . Bromfenac Sodium (PROLENSA) 0.07 % SOLN Apply to eye.    . carvedilol (COREG) 6.25 MG tablet TAKE 1 TABLET  BY MOUTH TWICE A DAY WITH FOOD 180 tablet 0  . cholecalciferol (VITAMIN D) 1000 units tablet Take 1,000 Units by mouth daily.    . Difluprednate (DUREZOL) 0.05 % EMUL Apply to eye.    . furosemide (LASIX) 20 MG tablet TAKE 1 TABLET BY MOUTH EVERY DAY AS NEEDED 90 tablet 1  . losartan (COZAAR) 50 MG tablet Take 1 tablet (50 mg total) by mouth daily. 90 tablet 1  . mometasone (NASONEX) 50 MCG/ACT nasal spray Place 2 sprays into the nose daily as needed (allergies).    . Multiple Vitamin (MULTIVITAMIN WITH MINERALS) TABS tablet Take 1 tablet by mouth daily.    . Omega-3 1000 MG CAPS Take 1,000 mg by mouth daily.    . Pramoxine-HC (HYDROCORTISONE ACE-PRAMOXINE) 2.5-1 % CREA Apply 1 Syringe topically 3 (three) times daily as needed. 1 Tube 3  . traMADol (ULTRAM) 50 MG tablet Take 1 tablet (50 mg total) by mouth at bedtime as needed for severe pain. 30 tablet 0   No current facility-administered medications for this visit.     Patient Active Problem List   Diagnosis Date Noted  . Localized edema 02/12/2018  . Essential hypertension 02/12/2018  . Chronic pain of right  knee 02/12/2018  . Abdominal pain 05/03/2017   Past Medical History:  Diagnosis Date  . Arthritis    RA  . CAD (coronary artery disease)   . Fibroid, uterine   . Hypertension     Family History  Problem Relation Age of Onset  . Heart disease Mother   . Dementia Mother   . Parkinson's disease Father   . Pancreatic cancer Sister     Past Surgical History:  Procedure Laterality Date  . CATARACT EXTRACTION, BILATERAL    . CHOLECYSTECTOMY     Social History   Occupational History  . Not on file  Tobacco Use  . Smoking status: Never Smoker  . Smokeless tobacco: Never Used  Substance and Sexual Activity  . Alcohol use: No    Alcohol/week: 0.0 standard drinks  . Drug use: No  . Sexual activity: Not Currently

## 2018-11-14 ENCOUNTER — Encounter: Payer: Self-pay | Admitting: Rheumatology

## 2018-11-14 ENCOUNTER — Other Ambulatory Visit: Payer: Self-pay

## 2018-11-14 ENCOUNTER — Ambulatory Visit (INDEPENDENT_AMBULATORY_CARE_PROVIDER_SITE_OTHER): Payer: Medicare Other | Admitting: Rheumatology

## 2018-11-14 VITALS — BP 148/89 | HR 76 | Resp 13 | Ht 62.0 in | Wt 165.4 lb

## 2018-11-14 DIAGNOSIS — M19042 Primary osteoarthritis, left hand: Secondary | ICD-10-CM

## 2018-11-14 DIAGNOSIS — M65331 Trigger finger, right middle finger: Secondary | ICD-10-CM | POA: Diagnosis not present

## 2018-11-14 DIAGNOSIS — M1711 Unilateral primary osteoarthritis, right knee: Secondary | ICD-10-CM

## 2018-11-14 DIAGNOSIS — M19041 Primary osteoarthritis, right hand: Secondary | ICD-10-CM | POA: Diagnosis not present

## 2018-11-14 DIAGNOSIS — I10 Essential (primary) hypertension: Secondary | ICD-10-CM

## 2018-11-14 DIAGNOSIS — R768 Other specified abnormal immunological findings in serum: Secondary | ICD-10-CM

## 2018-11-14 DIAGNOSIS — G5603 Carpal tunnel syndrome, bilateral upper limbs: Secondary | ICD-10-CM | POA: Diagnosis not present

## 2018-11-14 DIAGNOSIS — M8589 Other specified disorders of bone density and structure, multiple sites: Secondary | ICD-10-CM

## 2018-11-16 ENCOUNTER — Telehealth: Payer: Self-pay | Admitting: Internal Medicine

## 2018-11-16 NOTE — Telephone Encounter (Signed)
I left a message asking the pt to call me at [phone] to schedule AWV with Nickeah. VDM (DD) °

## 2018-11-19 ENCOUNTER — Telehealth: Payer: Self-pay

## 2018-11-19 NOTE — Telephone Encounter (Signed)
Pt left message stating that she was having rectal bleeding. Pt advised to try preparation h or tucks and I will call and check on her tmrw

## 2018-11-20 ENCOUNTER — Telehealth: Payer: Self-pay

## 2018-11-20 NOTE — Telephone Encounter (Signed)
Called to check on pt. Pt has tried tucks pad with ointment and she has some relief. She isnt experiencing any more bleeding. Encouraged to call the office if symptoms worsen

## 2018-11-28 ENCOUNTER — Telehealth: Payer: Self-pay | Admitting: Internal Medicine

## 2018-11-28 NOTE — Telephone Encounter (Signed)
I left a message asking the patient to call and schedule AWV with Pamala Hurry to an earlier date before she comes to see Dr. Baird Cancer in December [facility] calendar year). VDM (DD)

## 2018-12-05 ENCOUNTER — Telehealth: Payer: Self-pay | Admitting: Orthopaedic Surgery

## 2018-12-05 ENCOUNTER — Other Ambulatory Visit: Payer: Self-pay

## 2018-12-05 MED ORDER — FUROSEMIDE 20 MG PO TABS: 20.0000 mg | ORAL_TABLET | Freq: Every day | ORAL | 0 refills | Status: DC

## 2018-12-05 NOTE — Telephone Encounter (Signed)
Patient filled out authorization for FMLA, and paid $25.00 cash. Form, authorization, and cash sent to [employer] 12/05/2018.

## 2018-12-13 ENCOUNTER — Ambulatory Visit (INDEPENDENT_AMBULATORY_CARE_PROVIDER_SITE_OTHER): Payer: Medicare Other | Admitting: Internal Medicine

## 2018-12-13 ENCOUNTER — Other Ambulatory Visit: Payer: Self-pay

## 2018-12-13 ENCOUNTER — Encounter: Payer: Self-pay | Admitting: Internal Medicine

## 2018-12-13 VITALS — BP 160/80 | HR 82 | Temp 98.4°F | Ht 62.0 in | Wt 167.4 lb

## 2018-12-13 DIAGNOSIS — Z23 Encounter for immunization: Secondary | ICD-10-CM

## 2018-12-13 DIAGNOSIS — R600 Localized edema: Secondary | ICD-10-CM | POA: Diagnosis not present

## 2018-12-13 DIAGNOSIS — I10 Essential (primary) hypertension: Secondary | ICD-10-CM | POA: Diagnosis not present

## 2018-12-13 NOTE — Patient Instructions (Addendum)
Take another losartan today when you get home  Take TWO Losartan 50mg  tablets every morning-----  Friday-- Furosemide 20mg  - take one tab twice daily, possibly repeat in Monday  Be sure to respond to [place] message regarding your lab results

## 2018-12-14 LAB — BMP8+EGFR
BUN/Creatinine Ratio: 8 — ABNORMAL LOW (ref 12–28)
BUN: 6 mg/dL — ABNORMAL LOW (ref 8–27)
CO2: 26 mmol/L (ref 20–29)
Calcium: 9.3 mg/dL (ref 8.7–10.3)
Chloride: 99 mmol/L (ref 96–106)
Creatinine, Ser: 0.72 mg/dL (ref 0.57–1.00)
GFR calc Af Amer: 93 mL/min/{1.73_m2} (ref >59)
GFR calc non Af Amer: 81 mL/min/{1.73_m2} (ref >59)
Glucose: 81 mg/dL (ref 65–99)
Potassium: 4.1 mmol/L (ref 3.5–5.2)
Sodium: 136 mmol/L (ref 134–144)

## 2018-12-15 ENCOUNTER — Other Ambulatory Visit: Payer: Self-pay | Admitting: Internal Medicine

## 2018-12-16 ENCOUNTER — Encounter: Payer: Self-pay | Admitting: Internal Medicine

## 2018-12-16 NOTE — Progress Notes (Signed)
Subjective:     [name] , female    DOB: May 02, 1940 , 77 y.o.   MRN: [id number]   Chief Complaint  Patient presents with  . Edema    having surgery 12/20/2018    HPI  She is here today for evaluation of LE edema.  She feels her sx have worsened. She is concerned b/c she is scheduled to have carpal tunnel surgery next week.  She does admit to using salt in her foods and eating salty foods. She denies headaches, chest pain and shortness of breath.     Past Medical History:  Diagnosis Date  . Arthritis    RA  . CAD (coronary artery disease)   . Fibroid, uterine   . Hypertension      Family History  Problem Relation Age of Onset  . Heart disease Mother   . Dementia Mother   . Parkinson's disease Father   . Pancreatic cancer Sister      Current Outpatient Medications:  .  aspirin EC 81 MG tablet, Take 81 mg by mouth daily., Disp: , Rfl:  .  benzonatate (TESSALON) 100 MG capsule, TAKE ONE CAPSULE BY MOUTH 3 TIMES A DAY AS NEEDED FOR COUGH, Disp: 30 capsule, Rfl: 1 .  Besifloxacin HCl (BESIVANCE) 0.6 % SUSP, Apply to eye., Disp: , Rfl:  .  Bromfenac Sodium (PROLENSA) 0.07 % SOLN, Apply to eye., Disp: , Rfl:  .  carvedilol (COREG) 6.25 MG tablet, TAKE 1 TABLET BY MOUTH TWICE A DAY WITH FOOD, Disp: 180 tablet, Rfl: 0 .  cholecalciferol (VITAMIN D) 1000 units tablet, Take 1,000 Units by mouth daily., Disp: , Rfl:  .  Difluprednate (DUREZOL) 0.05 % EMUL, Apply to eye., Disp: , Rfl:  .  furosemide (LASIX) 20 MG tablet, Take 1 tablet (20 mg total) by mouth daily., Disp: 90 tablet, Rfl: 0 .  losartan (COZAAR) 50 MG tablet, Take 1 tablet (50 mg total) by mouth daily., Disp: 90 tablet, Rfl: 1 .  mometasone (NASONEX) 50 MCG/ACT nasal spray, Place 2 sprays into the nose daily as needed (allergies)., Disp: , Rfl:  .  Multiple Vitamin (MULTIVITAMIN WITH MINERALS) TABS tablet, Take 1 tablet by mouth daily., Disp: , Rfl:  .  Omega-3 1000 MG CAPS, Take 1,000 mg by mouth  daily., Disp: , Rfl:  .  Pramoxine-HC (HYDROCORTISONE ACE-PRAMOXINE) 2.5-1 % CREA, Apply 1 Syringe topically 3 (three) times daily as needed., Disp: 1 Tube, Rfl: 3 .  traMADol (ULTRAM) 50 MG tablet, Take 1 tablet (50 mg total) by mouth at bedtime as needed for severe pain. (Patient not taking: Reported on 11/14/2018), Disp: 30 tablet, Rfl: 0   No Known Allergies   Review of Systems  Constitutional: Negative.   Respiratory: Negative.   Cardiovascular: Positive for leg swelling.  Gastrointestinal: Negative.   Neurological: Negative.   Psychiatric/Behavioral: Negative.      Today's Vitals   12/13/18 1614  BP: (!) 160/80  Pulse: 82  Temp: 98.4 F (36.9 C)  TempSrc: Oral  SpO2: 98%  Weight: 167 lb 6.4 oz (75.9 kg)  Height: _0  (1.575 m)   Body mass index is 30.62 kg/m.   Objective:  Physical Exam Vitals signs and nursing note reviewed.  Constitutional:      Appearance: Normal appearance.  HENT:     Head: Normocephalic and atraumatic.  Cardiovascular:     Rate and Rhythm: Normal rate and regular rhythm.     Heart sounds: Normal heart sounds.  Pulmonary:     Effort: Pulmonary effort is normal.     Breath sounds: Normal breath sounds.  Musculoskeletal:     Right lower leg: 2+ Pitting Edema present.     Left lower leg: 2+ Pitting Edema present.  Skin:    General: Skin is warm.  Neurological:     General: No focal deficit present.     Mental Status: She is alert.  Psychiatric:        Mood and Affect: Mood normal.        Behavior: Behavior normal.         Assessment And Plan:     1. Bilateral lower extremity edema  Chronic, yet her sx appear worsened today. I will increase furosemide to 71m tomorrow and Monday morning. She is encouraged to email me over the weekend to let me know if higher dose of diuretic is working for her. She is encouraged to AVOID all salt intake, as well as breads and cheeses which tend to be high in sodium. She does admit to eating these  items frequently. She is also encouraged to elevate her legs while seated and to wear compression hose.   2. Essential hypertension, benign  Uncontrolled. Dose of losartan was increased to 518mat her last visit. I will now increase this to 10034maily. She agrees to rto next week for bp check. I will check bmp today and next week.   - BMP8+EGFR        RobMaximino GreenlandD    THE PATIENT IS ENCOURAGED TO PRACTICE SOCIAL DISTANCING DUE TO THE COVID-19 PANDEMIC.

## 2018-12-20 ENCOUNTER — Other Ambulatory Visit: Payer: Self-pay | Admitting: Orthopaedic Surgery

## 2018-12-20 DIAGNOSIS — G5601 Carpal tunnel syndrome, right upper limb: Secondary | ICD-10-CM | POA: Diagnosis not present

## 2018-12-20 DIAGNOSIS — M65331 Trigger finger, right middle finger: Secondary | ICD-10-CM | POA: Diagnosis not present

## 2018-12-20 HISTORY — PX: TRIGGER FINGER RELEASE: SHX641

## 2018-12-20 HISTORY — PX: CARPAL TUNNEL RELEASE: SHX101

## 2018-12-20 MED ORDER — HYDROCODONE-ACETAMINOPHEN 5-325 MG PO TABS: 1.0000 | ORAL_TABLET | Freq: Four times a day (QID) | ORAL | 0 refills | Status: DC | PRN

## 2018-12-25 ENCOUNTER — Ambulatory Visit (INDEPENDENT_AMBULATORY_CARE_PROVIDER_SITE_OTHER): Payer: Medicare Other

## 2018-12-25 ENCOUNTER — Other Ambulatory Visit: Payer: Self-pay

## 2018-12-25 VITALS — Ht 62.0 in | Wt 162.0 lb

## 2018-12-25 DIAGNOSIS — Z0000 Encounter for general adult medical examination without abnormal findings: Secondary | ICD-10-CM | POA: Diagnosis not present

## 2018-12-25 NOTE — Progress Notes (Signed)
Subjective:   Tina Ware is a 78 y.o. female who presents for Medicare Annual (Subsequent) preventive examination.  This visit type was conducted due to national recommendations for restrictions regarding the COVID-19 Pandemic (e.g. social distancing). This format is felt to be most appropriate for this patient at this time. All issues noted in this document were discussed and addressed. No physical exam was performed (except for noted visual exam findings with Video Visits). The patient, Tina Ware, has given consent to perform this visit via video. Vital signs may be absent or patient reported.   Patient location:  At home   Nurse location:  [place] office   Review of Systems:  n/a Cardiac Risk Factors include: advanced age (>54men, >33 women);hypertension     Objective:     Vitals: Ht 5\' 2"  (1.575 m) Comment: per patient  Wt 162 lb (73.5 kg) Comment: per patient  BMI 29.63 kg/m   Body mass index is 29.63 kg/m.  Advanced Directives 12/25/2018 03/14/2018 04/29/2017 04/29/2017 08/21/2015  Does Patient Have a Medical Advance Directive? No No No No No  Would patient like information on creating a medical advance directive? - Yes (MAU/Ambulatory/Procedural Areas - Information given) No - Patient declined No - Patient declined No - patient declined information    Tobacco Social History   Tobacco Use  Smoking Status Never Smoker  Smokeless Tobacco Never Used     Counseling given: Not Answered   Clinical Intake:  Pre-visit preparation completed: Yes  Pain : No/denies pain     Nutritional Status: BMI 25 -29 Overweight Nutritional Risks: None Diabetes: No  How often do you need to have someone help you when you read instructions, pamphlets, or other written materials from your doctor or pharmacy?: 1 - Never What is the last grade level you completed in school?: college  Interpreter Needed?: No  Information entered by :: NAllen LPN  Past Medical  History:  Diagnosis Date  . Arthritis    RA  . CAD (coronary artery disease)   . Fibroid, uterine   . Hypertension    Past Surgical History:  Procedure Laterality Date  . CARPAL TUNNEL RELEASE Right 12/20/2018  . CATARACT EXTRACTION, BILATERAL    . CHOLECYSTECTOMY    . TRIGGER FINGER RELEASE Right 12/20/2018   Family History  Problem Relation Age of Onset  . Heart disease Mother   . Dementia Mother   . Parkinson's disease Father   . Pancreatic cancer Sister    Social History   Socioeconomic History  . Marital status: Single    Spouse name: Not on file  . Number of children: Not on file  . Years of education: Not on file  . Highest education level: Not on file  Occupational History  . Not on file  Social Needs  . Financial resource strain: Not hard at all  . Food insecurity    Worry: Never true    Inability: Never true  . Transportation needs    Medical: No    Non-medical: No  Tobacco Use  . Smoking status: Never Smoker  . Smokeless tobacco: Never Used  Substance and Sexual Activity  . Alcohol use: No    Alcohol/week: 0.0 standard drinks  . Drug use: No  . Sexual activity: Not Currently  Lifestyle  . Physical activity    Days per week: 3 days    Minutes per session: 10 min  . Stress: Not at all  Relationships  . Social connections  Talks on phone: Not on file    Gets together: Not on file    Attends religious service: Not on file    Active member of club or organization: Not on file    Attends meetings of clubs or organizations: Not on file    Relationship status: Not on file  Other Topics Concern  . Not on file  Social History Narrative  . Not on file    Outpatient Encounter Medications as of 12/25/2018  Medication Sig  . aspirin EC 81 MG tablet Take 81 mg by mouth daily.  . benzonatate (TESSALON) 100 MG capsule TAKE ONE CAPSULE BY MOUTH 3 TIMES A DAY AS NEEDED FOR COUGH  . carvedilol (COREG) 6.25 MG tablet TAKE 1 TABLET BY MOUTH TWICE A DAY  WITH FOOD  . cholecalciferol (VITAMIN D) 1000 units tablet Take 1,000 Units by mouth daily.  . furosemide (LASIX) 20 MG tablet Take 1 tablet (20 mg total) by mouth daily.  Marland Kitchen HYDROcodone-acetaminophen (NORCO/VICODIN) 5-325 MG tablet Take 1 tablet by mouth every 6 (six) hours as needed for moderate pain.  Marland Kitchen losartan (COZAAR) 50 MG tablet Take 1 tablet (50 mg total) by mouth daily.  . mometasone (NASONEX) 50 MCG/ACT nasal spray Place 2 sprays into the nose daily as needed (allergies).  . Multiple Vitamin (MULTIVITAMIN WITH MINERALS) TABS tablet Take 1 tablet by mouth daily.  . Omega-3 1000 MG CAPS Take 1,000 mg by mouth daily.  . Pramoxine-HC (HYDROCORTISONE ACE-PRAMOXINE) 2.5-1 % CREA Apply 1 Syringe topically 3 (three) times daily as needed.  Marland Kitchen Besifloxacin HCl (BESIVANCE) 0.6 % SUSP Apply to eye.  . Bromfenac Sodium (PROLENSA) 0.07 % SOLN Apply to eye.  . Difluprednate (DUREZOL) 0.05 % EMUL Apply to eye.  . traMADol (ULTRAM) 50 MG tablet Take 1 tablet (50 mg total) by mouth at bedtime as needed for severe pain. (Patient not taking: Reported on 11/14/2018)   No facility-administered encounter medications on file as of 12/25/2018.     Activities of Daily Living In your present state of health, do you have any difficulty performing the following activities: 12/25/2018 10/15/2018  Hearing? Y N  Comment sometimes -  Vision? N N  Difficulty concentrating or making decisions? N N  Walking or climbing stairs? Y N  Dressing or bathing? N N  Doing errands, shopping? N N  Preparing Food and eating ? N -  Using the Toilet? N -  In the past six months, have you accidently leaked urine? N -  Do you have problems with loss of bowel control? N -  Managing your Medications? N -  Managing your Finances? N -  Housekeeping or managing your Housekeeping? N -  Some recent data might be hidden    Patient Care Team: Glendale Chard, MD as PCP - General (Internal Medicine)    Assessment:   This is a  routine wellness examination for Tina Ware.  Exercise Activities and Dietary recommendations Current Exercise Habits: Home exercise routine, Type of exercise: walking, Time (Minutes): 15, Frequency (Times/Week): 3, Weekly Exercise (Minutes/Week): 45  Goals    . DIET - INCREASE WATER INTAKE (pt-stated)    . Exercise 150 min/wk Moderate Activity (pt-stated)    . Patient Stated     12/25/2018, wants to continue to remain mobile and stay cognitive       Fall Risk Fall Risk  12/25/2018 12/13/2018 10/15/2018 09/04/2018 08/06/2018  Falls in the past year? 0 0 0 0 0  Number falls in past yr: 0 - - - -  Risk for fall due to : Medication side effect - - - -  Follow up Falls evaluation completed;Falls prevention discussed - - - -   Is the patient's home free of loose throw rugs in walkways, pet beds, electrical cords, etc?   yes      Grab bars in the bathroom? no      Handrails on the stairs?   yes      Adequate lighting?   yes  Timed Get Up and Go performed: n/a  Depression Screen PHQ 2/9 Scores 12/25/2018 12/13/2018 09/04/2018 08/06/2018  PHQ - 2 Score 0 0 0 0  PHQ- 9 Score 0 - - -     Cognitive Function     6CIT Screen 12/25/2018 03/14/2018  What Year? 0 points 0 points  What month? 0 points 0 points  What time? 0 points 0 points  Count back from 20 0 points 0 points  Months in reverse 0 points 0 points  Repeat phrase 0 points 0 points  Total Score 0 0    Immunization History  Administered Date(s) Administered  . Influenza, High Dose Seasonal PF 01/15/2018  . Pneumococcal Conjugate-13 12/13/2018  . Tdap 07/06/2017  . Zoster Recombinat (Shingrix) 11/09/2018    Qualifies for Shingles Vaccine? yes  Screening Tests Health Maintenance  Topic Date Due  . INFLUENZA VACCINE  10/27/2018  . TETANUS/TDAP  07/07/2027  . DEXA SCAN  Completed  . PNA vac Low Risk Adult  Completed    Cancer Screenings: Lung: Low Dose CT Chest recommended if Age 19-80 years, 30 pack-year currently smoking  OR have quit w/in 15years. Patient does not qualify. Breast:  Up to date on Mammogram? Yes   Up to date of Bone Density/Dexa? Yes Colorectal: patient would like  Additional Screenings: : Hepatitis C Screening: n/a     Plan:    Patient wants to remain mobile and stay cognitive.   I have personally reviewed and noted the following in the patient's chart:   . Medical and social history . Use of alcohol, tobacco or illicit drugs  . Current medications and supplements . Functional ability and status . Nutritional status . Physical activity . Advanced directives . List of other physicians . Hospitalizations, surgeries, and ER visits in previous 12 months . Vitals . Screenings to include cognitive, depression, and falls . Referrals and appointments  In addition, I have reviewed and discussed with patient certain preventive protocols, quality metrics, and best practice recommendations. A written personalized care plan for preventive services as well as general preventive health recommendations were provided to patient.     Kellie Simmering, LPN  [id number]

## 2018-12-25 NOTE — Patient Instructions (Signed)
Tina Ware , Thank you for taking time to come for your Medicare Wellness Visit. I appreciate your ongoing commitment to your health goals. Please review the following plan we discussed and let me know if I can assist you in the future.   Screening recommendations/referrals: Colonoscopy: patient would like Mammogram: 02/2018 Bone Density: 02/2018 Recommended yearly ophthalmology/optometry visit for glaucoma screening and checkup Recommended yearly dental visit for hygiene and checkup  Vaccinations: Influenza vaccine: 12/2017 Pneumococcal vaccine: 11/2018 Tdap vaccine: 06/2017 Shingles vaccine: 10/2018    Advanced directives: Please bring a copy of your POA (Power of [place]) and/or Living Will to your next appointment.    Conditions/risks identified: overweight  Next appointment: 01/02/2019 at 3:15   Preventive Care 42 Years and Older, Female Preventive care refers to lifestyle choices and visits with your health care provider that can promote health and wellness. What does preventive care include?  A yearly physical exam. This is also called an annual well check.  Dental exams once or twice a year.  Routine eye exams. Ask your health care provider how often you should have your eyes checked.  Personal lifestyle choices, including:  Daily care of your teeth and gums.  Regular physical activity.  Eating a healthy diet.  Avoiding tobacco and drug use.  Limiting alcohol use.  Practicing safe sex.  Taking low-dose aspirin every day.  Taking vitamin and mineral supplements as recommended by your health care provider. What happens during an annual well check? The services and screenings done by your health care provider during your annual well check will depend on your age, overall health, lifestyle risk factors, and family history of disease. Counseling  Your health care provider may ask you questions about your:  Alcohol use.  Tobacco use.  Drug use.  Emotional  well-being.  Home and relationship well-being.  Sexual activity.  Eating habits.  History of falls.  Memory and ability to understand (cognition).  Work and work Statistician.  Reproductive health. Screening  You may have the following tests or measurements:  Height, weight, and BMI.  Blood pressure.  Lipid and cholesterol levels. These may be checked every 5 years, or more frequently if you are over 57 years old.  Skin check.  Lung cancer screening. You may have this screening every year starting at age 5 if you have a 30-pack-year history of smoking and currently smoke or have quit within the past 15 years.  Fecal occult blood test (FOBT) of the stool. You may have this test every year starting at age 53.  Flexible sigmoidoscopy or colonoscopy. You may have a sigmoidoscopy every 5 years or a colonoscopy every 10 years starting at age 32.  Hepatitis C blood test.  Hepatitis B blood test.  Sexually transmitted disease (STD) testing.  Diabetes screening. This is done by checking your blood sugar (glucose) after you have not eaten for a while (fasting). You may have this done every 1-3 years.  Bone density scan. This is done to screen for osteoporosis. You may have this done starting at age 58.  Mammogram. This may be done every 1-2 years. Talk to your health care provider about how often you should have regular mammograms. Talk with your health care provider about your test results, treatment options, and if necessary, the need for more tests. Vaccines  Your health care provider may recommend certain vaccines, such as:  Influenza vaccine. This is recommended every year.  Tetanus, diphtheria, and acellular pertussis (Tdap, Td) vaccine. You may need a  Td booster every 10 years.  Zoster vaccine. You may need this after age 75.  Pneumococcal 13-valent conjugate (PCV13) vaccine. One dose is recommended after age 65.  Pneumococcal polysaccharide (PPSV23) vaccine. One  dose is recommended after age 47. Talk to your health care provider about which screenings and vaccines you need and how often you need them. This information is not intended to replace advice given to you by your health care provider. Make sure you discuss any questions you have with your health care provider. Document Released: 04/10/2015 Document Revised: 12/02/2015 Document Reviewed: 01/13/2015 Elsevier Interactive Patient Education  2017 [place] Prevention in the Home Falls can cause injuries. They can happen to people of all ages. There are many things you can do to make your home safe and to help prevent falls. What can I do on the outside of my home?  Regularly fix the edges of walkways and driveways and fix any cracks.  Remove anything that might make you trip as you walk through a door, such as a raised step or threshold.  Trim any bushes or trees on the path to your home.  Use bright outdoor lighting.  Clear any walking paths of anything that might make someone trip, such as rocks or tools.  Regularly check to see if handrails are loose or broken. Make sure that both sides of any steps have handrails.  Any raised decks and porches should have guardrails on the edges.  Have any leaves, snow, or ice cleared regularly.  Use sand or salt on walking paths during winter.  Clean up any spills in your garage right away. This includes oil or grease spills. What can I do in the bathroom?  Use night lights.  Install grab bars by the toilet and in the tub and shower. Do not use towel bars as grab bars.  Use non-skid mats or decals in the tub or shower.  If you need to sit down in the shower, use a plastic, non-slip stool.  Keep the floor dry. Clean up any water that spills on the floor as soon as it happens.  Remove soap buildup in the tub or shower regularly.  Attach bath mats securely with double-sided non-slip rug tape.  Do not have throw rugs and other  things on the floor that can make you trip. What can I do in the bedroom?  Use night lights.  Make sure that you have a light by your bed that is easy to reach.  Do not use any sheets or blankets that are too big for your bed. They should not hang down onto the floor.  Have a firm chair that has side arms. You can use this for support while you get dressed.  Do not have throw rugs and other things on the floor that can make you trip. What can I do in the kitchen?  Clean up any spills right away.  Avoid walking on wet floors.  Keep items that you use a lot in easy-to-reach places.  If you need to reach something above you, use a strong step stool that has a grab bar.  Keep electrical cords out of the way.  Do not use floor polish or wax that makes floors slippery. If you must use wax, use non-skid floor wax.  Do not have throw rugs and other things on the floor that can make you trip. What can I do with my stairs?  Do not leave any items on the stairs.  Make sure that there are handrails on both sides of the stairs and use them. Fix handrails that are broken or loose. Make sure that handrails are as long as the stairways.  Check any carpeting to make sure that it is firmly attached to the stairs. Fix any carpet that is loose or worn.  Avoid having throw rugs at the top or bottom of the stairs. If you do have throw rugs, attach them to the floor with carpet tape.  Make sure that you have a light switch at the top of the stairs and the bottom of the stairs. If you do not have them, ask someone to add them for you. What else can I do to help prevent falls?  Wear shoes that:  Do not have high heels.  Have rubber bottoms.  Are comfortable and fit you well.  Are closed at the toe. Do not wear sandals.  If you use a stepladder:  Make sure that it is fully opened. Do not climb a closed stepladder.  Make sure that both sides of the stepladder are locked into place.  Ask  someone to hold it for you, if possible.  Clearly mark and make sure that you can see:  Any grab bars or handrails.  First and last steps.  Where the edge of each step is.  Use tools that help you move around (mobility aids) if they are needed. These include:  Canes.  Walkers.  Scooters.  Crutches.  Turn on the lights when you go into a dark area. Replace any light bulbs as soon as they burn out.  Set up your furniture so you have a clear path. Avoid moving your furniture around.  If any of your floors are uneven, fix them.  If there are any pets around you, be aware of where they are.  Review your medicines with your doctor. Some medicines can make you feel dizzy. This can increase your chance of falling. Ask your doctor what other things that you can do to help prevent falls. This information is not intended to replace advice given to you by your health care provider. Make sure you discuss any questions you have with your health care provider. Document Released: 01/08/2009 Document Revised: 08/20/2015 Document Reviewed: 04/18/2014 Elsevier Interactive Patient Education  2017 [employer].

## 2018-12-27 ENCOUNTER — Encounter: Payer: Self-pay | Admitting: Internal Medicine

## 2018-12-27 ENCOUNTER — Other Ambulatory Visit: Payer: Self-pay

## 2018-12-27 ENCOUNTER — Ambulatory Visit (INDEPENDENT_AMBULATORY_CARE_PROVIDER_SITE_OTHER): Payer: Medicare Other | Admitting: Orthopaedic Surgery

## 2018-12-27 ENCOUNTER — Encounter: Payer: Self-pay | Admitting: Orthopaedic Surgery

## 2018-12-27 DIAGNOSIS — G5601 Carpal tunnel syndrome, right upper limb: Secondary | ICD-10-CM | POA: Insufficient documentation

## 2018-12-27 NOTE — Progress Notes (Signed)
   Office Visit Note   Patient: Tina Ware           Date of Birth: 27-Mar-1941           MRN: [id number] Visit Date: 12/27/2018              Requested by: Glendale Chard, [address] [place],   [id number] PCP: Glendale Chard, MD   Assessment & Plan: Visit Diagnoses:  1. Carpal tunnel syndrome, right upper limb     Plan: 1 week status post right carpal tunnel release and release of right ring trigger finger.  Doing well.  Dressing was removed, stitches removed and Steri-Strips applied.  Volar wrist splint applied to right wrist.  Will work on range of motion and return in 2 weeks.  Doing well  Follow-Up Instructions: Return in about 2 weeks (around 01/10/2019).   Orders:  No orders of the defined types were placed in this encounter.  No orders of the defined types were placed in this encounter.     Procedures: No procedures performed   Clinical Data: No additional findings.   Subjective: Chief Complaint  Patient presents with  . Right Wrist - Routine Post Op    Right carpal tunnel release DOS 12/20/2018  Patient presents today one week out from surgery. She had right carpal tunnel release and trigger middle finger release on 12/20/2018. She said that her wrist is sore, but doing well. She is not taking anything for pain.   HPI  Review of Systems   Objective: Vital Signs: BP (!) 177/89   Pulse 76   Ht 5\' 2"  (1.575 m)   Wt 162 lb (73.5 kg)   BMI 29.63 kg/m   Physical Exam  Ortho Exam carpal tunnel and right ring trigger finger incisions healing without problem.  I remove the stitches and applied Steri-Strips.  Has some mild diffuse swelling of all of her fingers but not the hand.  Could be related to the soft dressing.  Will work on range of motion exercises.  Good capillary refill to all of her fingers she does have sensibility.  She also has an intact opponens pollicis  Specialty Comments:  No specialty comments  available.  Imaging: No results found.   PMFS History: Patient Active Problem List   Diagnosis Date Noted  . Carpal tunnel syndrome, right upper limb 12/27/2018  . Localized edema 02/12/2018  . Essential hypertension 02/12/2018  . Chronic pain of right knee 02/12/2018  . Abdominal pain 05/03/2017   Past Medical History:  Diagnosis Date  . Arthritis    RA  . CAD (coronary artery disease)   . Fibroid, uterine   . Hypertension     Family History  Problem Relation Age of Onset  . Heart disease Mother   . Dementia Mother   . Parkinson's disease Father   . Pancreatic cancer Sister     Past Surgical History:  Procedure Laterality Date  . CARPAL TUNNEL RELEASE Right 12/20/2018  . CATARACT EXTRACTION, BILATERAL    . CHOLECYSTECTOMY    . TRIGGER FINGER RELEASE Right 12/20/2018   Social History   Occupational History  . Not on file  Tobacco Use  . Smoking status: Never Smoker  . Smokeless tobacco: Never Used  Substance and Sexual Activity  . Alcohol use: No    Alcohol/week: 0.0 standard drinks  . Drug use: No  . Sexual activity: Not Currently

## 2019-01-02 ENCOUNTER — Telehealth: Payer: Self-pay

## 2019-01-02 ENCOUNTER — Ambulatory Visit: Payer: Medicare Other | Admitting: Internal Medicine

## 2019-01-02 NOTE — Telephone Encounter (Signed)
Called pt to give her a new appt date and time, pt did not answer

## 2019-01-04 ENCOUNTER — Telehealth: Payer: Self-pay | Admitting: Orthopaedic Surgery

## 2019-01-04 NOTE — Telephone Encounter (Signed)
Patient called stating her right hand is still very swollen and requesting a return call.  Patient states she had surgery on 12/20/18 and is worried about the swelling.

## 2019-01-04 NOTE — Telephone Encounter (Signed)
Per Dr. Durward Fortes patient advised to take the splint off to continue to mover her finger and to elevated her arm as much as possible. Confirmed with patient again that she has not had any signes of infection. Denies and fever or chills.

## 2019-01-04 NOTE — Telephone Encounter (Signed)
Patient states her hand is still swelling since her appointment 12/27/18. Patient states she has been elevating her arm, wearing her brace for about 12 hours a day and icing for 20 minutes a day. Patient would like to know what else she can do for the swelling. Patient states her fingers are tight.

## 2019-01-07 ENCOUNTER — Telehealth: Payer: Self-pay | Admitting: Orthopaedic Surgery

## 2019-01-07 NOTE — Telephone Encounter (Signed)
Patient called stating her hand is still "very swollen."  Patient states she has been elevating and icing, but it is not improving.  Patient is requesting a return call.

## 2019-01-07 NOTE — Telephone Encounter (Signed)
Spoke with patient. She is going to come in tomorrow for a recheck with Dr.Whitfield.

## 2019-01-08 ENCOUNTER — Encounter: Payer: Self-pay | Admitting: Orthopaedic Surgery

## 2019-01-08 ENCOUNTER — Telehealth: Payer: Self-pay

## 2019-01-08 ENCOUNTER — Other Ambulatory Visit: Payer: Self-pay

## 2019-01-08 ENCOUNTER — Ambulatory Visit (INDEPENDENT_AMBULATORY_CARE_PROVIDER_SITE_OTHER): Payer: Medicare Other | Admitting: Orthopaedic Surgery

## 2019-01-08 VITALS — BP 187/97 | HR 86 | Ht 62.0 in | Wt 162.0 lb

## 2019-01-08 DIAGNOSIS — G5601 Carpal tunnel syndrome, right upper limb: Secondary | ICD-10-CM

## 2019-01-08 NOTE — Progress Notes (Signed)
Office Visit Note   Patient: Tina Ware           Date of Birth: April 09, 1940           MRN: [id number] Visit Date: 01/08/2019              Requested by: Glendale Chard, [address] [place],  [place] [address] PCP: Glendale Chard, MD   Assessment & Plan: Visit Diagnoses:  1. Carpal tunnel syndrome, right upper limb     Plan: Nearly 3 weeks status post right carpal tunnel release and release of the A1 pulley for triggering of the long finger.  Still has a little numbness in the tips of her fingers but "better".  Is not triggering but has developed significant swelling of her fingers.  The hand is not swollen.  We will try a course of physical therapy and check her in 2 weeks.  Her incisions are otherwise healing well.  Nerve conduction studies and EMGs demonstrated a very severe compression on the right.  Tina Ware is aware that she may have less than complete resolution of her preop symptoms based on the severity of the compression  Follow-Up Instructions: Return in about 2 weeks (around 01/22/2019).   Orders:  No orders of the defined types were placed in this encounter.  No orders of the defined types were placed in this encounter.     Procedures: No procedures performed   Clinical Data: No additional findings.   Subjective: Chief Complaint  Patient presents with  . Right Hand - Routine Post Op    Right carpal tunnel release and middle trigger finger release DOS 12/20/2018  Patient presents today for follow up on her right carpal tunnel release and middle trigger finger release. Her surgery was on 12/20/2018. She is almost 3 weeks out from surgery and states that her swelling has not subsided. she does not have constant pain, and does not take anything for pain.   HPI  Review of Systems   Objective: Vital Signs: BP (!) 187/97   Pulse 86   Ht 5\' 2"  (1.575 m)   Wt 162 lb (73.5 kg)   BMI 29.63 kg/m   Physical Exam  Ortho Exam right hand  incisions are healing without a problem.  There was 1 residual stitch in the trigger finger release which I removed.  Fingers are swollen and shiny.  Lacks 3 to 4 fingerbreadths from touching the tips of her fingers to the palm of her hand.  She did have a pre-existing flexion contracture across the PIP joint of the long finger noted at the time of surgery.  Still has a little decreased sensibility of the tips of her fingers but good capillary refill.  She is able to semi oppose her thumb to little finger  Specialty Comments:  No specialty comments available.  Imaging: No results found.   PMFS History: Patient Active Problem List   Diagnosis Date Noted  . Carpal tunnel syndrome, right upper limb 12/27/2018  . Localized edema 02/12/2018  . Essential hypertension 02/12/2018  . Chronic pain of right knee 02/12/2018  . Abdominal pain 05/03/2017   Past Medical History:  Diagnosis Date  . Arthritis    RA  . CAD (coronary artery disease)   . Fibroid, uterine   . Hypertension     Family History  Problem Relation Age of Onset  . Heart disease Mother   . Dementia Mother   . Parkinson's disease Father   .  Pancreatic cancer Sister     Past Surgical History:  Procedure Laterality Date  . CARPAL TUNNEL RELEASE Right 12/20/2018  . CATARACT EXTRACTION, BILATERAL    . CHOLECYSTECTOMY    . TRIGGER FINGER RELEASE Right 12/20/2018   Social History   Occupational History  . Not on file  Tobacco Use  . Smoking status: Never Smoker  . Smokeless tobacco: Never Used  Substance and Sexual Activity  . Alcohol use: No    Alcohol/week: 0.0 standard drinks  . Drug use: No  . Sexual activity: Not Currently

## 2019-01-08 NOTE — Addendum Note (Signed)
Addended by: Lendon Collar on: 01/08/2019 03:39 PM   Modules accepted: Orders

## 2019-01-08 NOTE — Telephone Encounter (Signed)
LVM appt reminder for pt tomorrow 01/09/2019

## 2019-01-09 ENCOUNTER — Other Ambulatory Visit: Payer: Self-pay

## 2019-01-09 ENCOUNTER — Ambulatory Visit: Payer: Medicare Other | Admitting: Internal Medicine

## 2019-01-09 ENCOUNTER — Encounter: Payer: Self-pay | Admitting: Internal Medicine

## 2019-01-09 VITALS — BP 148/82 | HR 82 | Temp 98.1°F | Ht 61.8 in | Wt 164.0 lb

## 2019-01-09 DIAGNOSIS — R600 Localized edema: Secondary | ICD-10-CM

## 2019-01-09 DIAGNOSIS — J309 Allergic rhinitis, unspecified: Secondary | ICD-10-CM

## 2019-01-09 DIAGNOSIS — I10 Essential (primary) hypertension: Secondary | ICD-10-CM

## 2019-01-09 DIAGNOSIS — M79641 Pain in right hand: Secondary | ICD-10-CM | POA: Diagnosis not present

## 2019-01-09 DIAGNOSIS — Z23 Encounter for immunization: Secondary | ICD-10-CM

## 2019-01-09 DIAGNOSIS — K649 Unspecified hemorrhoids: Secondary | ICD-10-CM

## 2019-01-09 MED ORDER — MOMETASONE FUROATE 50 MCG/ACT NA SUSP: 2.0000 | Freq: Every day | NASAL | 1 refills | Status: DC | PRN

## 2019-01-09 MED ORDER — HYDROCORTISONE ACETATE 25 MG RE SUPP: 25.0000 mg | Freq: Two times a day (BID) | RECTAL | 0 refills | Status: DC | PRN

## 2019-01-09 NOTE — Patient Instructions (Signed)
Preventing Influenza, Adult Influenza, more commonly known as "the flu," is a viral infection that mainly affects the respiratory tract. The respiratory tract includes structures that help you breathe, such as the lungs, nose, and throat. The flu causes many common cold symptoms, as well as a high fever and body aches. The flu spreads easily from person to person (is contagious). The flu is most common from December through March. This is called flu season.You can catch the flu virus by:  Breathing in droplets from an infected person's cough or sneeze.  Touching something that was recently contaminated with the virus and then touching your mouth, nose, or eyes. What can I do to lower my risk?        You can decrease your risk of getting the flu by:  Getting a flu shot (influenza vaccination) every year. This is the best way to prevent the flu. A flu shot is recommended for everyone age 6 months and older. ? It is best to get a flu shot in the fall, as soon as it is available. Getting a flu shot during winter or spring instead is still a good idea. Flu season can last into early spring. ? Preventing the flu through vaccination requires getting a new flu shot every year. This is because the flu virus changes slightly (mutates) from one year to the next. Even if a flu shot does not completely protect you from all flu virus mutations, it can reduce the severity of your illness and prevent dangerous complications of the flu. ? If you are pregnant, you can and should get a flu shot. ? If you have had a reaction to the shot in the past or if you are allergic to eggs, check with your health care provider before getting a flu shot. ? Sometimes the vaccine is available as a nasal spray. In some years, the nasal spray has not been as effective against the flu virus. Check with your health care provider if you have questions about this.  Practicing good health habits. This is especially important during  flu season. ? Avoid contact with people who are sick with flu or cold symptoms. ? Wash your hands with soap and water often. If soap and water are not available, use alcohol-based hand sanitizer. ? Avoid touching your hands to your face, especially when you have not washed your hands recently. ? Use a disinfectant to clean surfaces at home and at work that may be contaminated with the flu virus. ? Keep your body's disease-fighting system (immune system) in good shape by eating a healthy diet, drinking plenty of fluids, getting enough sleep, and exercising regularly. If you do get the flu, avoid spreading it to others by:  Staying home until your symptoms have been gone for at least one day.  Covering your mouth and nose when you cough or sneeze.  Avoiding close contact with others, especially babies and elderly people. Why are these changes important? Getting a flu shot and practicing good health habits protects you as well as other people. If you get the flu, your friends, family, and co-workers are also at risk of getting it, because it spreads so easily to others. Each year, about 2 out of every 10 people get the flu. Having the flu can lead to complications, such as pneumonia, ear infection, and sinus infection. The flu also can be deadly, especially for babies, people older than age 65, and people who have serious long-term diseases. How is this treated? Most   people recover from the flu by resting at home and drinking plenty of fluids. However, a prescription antiviral medicine may reduce your flu symptoms and may make your flu go away sooner. This medicine must be started within a few days of getting flu symptoms. You can talk with your health care provider about whether you need an antiviral medicine. Antiviral medicine may be prescribed for people who are at risk for more serious flu symptoms. This includes people who:  Are older than age 65.  Are pregnant.  Have a condition that  makes the flu worse or more dangerous. Where to find more information  Centers for Disease Control and Prevention: www.cdc.gov/flu/index.htm  Flu.gov: www.flu.gov/prevention-vaccination  American Academy [employer]: familydoctor.org/familydoctor/en/kids/vaccines/preventing-the-flu.html Contact a health care provider if:  You have influenza and you develop new symptoms.  You have: ? Chest pain. ? Diarrhea. ? A fever.  Your cough gets worse, or you produce more mucus. Summary  The best way to prevent the flu is to get a flu shot every year in the fall.  Even if you get the flu after you have received the yearly vaccine, your flu may be milder and go away sooner because of your flu shot.  If you get the flu, antiviral medicines that are started with a few days of symptoms may reduce your flu symptoms and may make your flu go away sooner.  You can also help prevent the flu by practicing good health habits. This information is not intended to replace advice given to you by your health care provider. Make sure you discuss any questions you have with your health care provider. Document Released: 03/29/2015 Document Revised: 02/24/2017 Document Reviewed: 11/21/2015 Elsevier Patient Education  2020 [employer]  

## 2019-01-10 ENCOUNTER — Ambulatory Visit: Payer: Medicare Other | Admitting: Orthopaedic Surgery

## 2019-01-10 ENCOUNTER — Ambulatory Visit: Payer: Medicare Other | Attending: Orthopaedic Surgery | Admitting: Physical Therapy

## 2019-01-10 ENCOUNTER — Encounter: Payer: Self-pay | Admitting: Physical Therapy

## 2019-01-10 DIAGNOSIS — R600 Localized edema: Secondary | ICD-10-CM | POA: Diagnosis present

## 2019-01-10 DIAGNOSIS — M25641 Stiffness of right hand, not elsewhere classified: Secondary | ICD-10-CM | POA: Diagnosis present

## 2019-01-10 LAB — BMP8+EGFR
BUN/Creatinine Ratio: 14 (ref 12–28)
BUN: 10 mg/dL (ref 8–27)
CO2: 25 mmol/L (ref 20–29)
Calcium: 9.3 mg/dL (ref 8.7–10.3)
Chloride: 103 mmol/L (ref 96–106)
Creatinine, Ser: 0.74 mg/dL (ref 0.57–1.00)
GFR calc Af Amer: 90 mL/min/{1.73_m2} (ref >59)
GFR calc non Af Amer: 78 mL/min/{1.73_m2} (ref >59)
Glucose: 82 mg/dL (ref 65–99)
Potassium: 3.9 mmol/L (ref 3.5–5.2)
Sodium: 138 mmol/L (ref 134–144)

## 2019-01-10 NOTE — Therapy (Signed)
[place], [place], [address] Phone: [phone]   Fax:  [phone]  Physical Therapy Evaluation  Patient Details  Name: Tina Ware MRN: [id number] Date of Birth: [birth year] Referring Provider (PT): Joni Fears, MD   Encounter Date: 01/10/2019  PT End of Session - 01/10/19 1343    Visit Number  1    Number of Visits  7    Date for PT Re-Evaluation  02/08/19    Authorization Type  [employer]    PT Start Time  1332    PT Stop Time  1413    PT Time Calculation (min)  41 min    Activity Tolerance  Patient tolerated treatment well    Behavior During Therapy  [facility] for tasks assessed/performed       Past Medical History:  Diagnosis Date  . Arthritis    RA  . CAD (coronary artery disease)   . Fibroid, uterine   . Hypertension     Past Surgical History:  Procedure Laterality Date  . CARPAL TUNNEL RELEASE Right 12/20/2018  . CATARACT EXTRACTION, BILATERAL    . CHOLECYSTECTOMY    . TRIGGER FINGER RELEASE Right 12/20/2018    There were no vitals filed for this visit.   Subjective Assessment - 01/10/19 1341    Subjective  I am getting a little feeling back. Swelling is not as bad as it was. I have been trying to passively bend my fingers. Some Right shoulder pain at night depending on how I sleep.    Patient Stated Goals  use dominant hand    Currently in Pain?  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      Cognition   Overall Cognitive Status  Within Functional Limits for tasks assessed      Sensation   Additional Comments  numb and sore      ROM / Strength   AROM / PROM / Strength  Strength;AROM      AROM   AROM Assessment Site  Wrist    Right/Left Wrist  Right    Right Wrist Extension  30 Degrees    Right Wrist Flexion  45 Degrees      Strength   Overall Strength Comments  unable to actively make a fist    Strength Assessment Site  Wrist    Right/Left Wrist  Right    Right Wrist Flexion  3+/5    Right Wrist Extension  3+/5                Objective measurements completed on examination: See above findings.      [facility] PT Treatment/Exercise - 01/10/19 0001  Manual Therapy   Manual Therapy  Edema management;Passive ROM    Edema Management  Rt hand    Passive ROM  IPs and wrist             PT Education - 01/10/19 1638    Education Details  anatomy of condition, POC, HEP, exercise form/rationale    Person(s) Educated  Patient    Methods  Explanation;Demonstration;Tactile cues;Verbal cues    Comprehension  Verbalized understanding;Returned demonstration;Verbal cues required;Tactile cues required;Need further instruction          PT Long Term Goals - 01/10/19 1634      PT LONG TERM GOAL #1   Title  Pt will be able to make a full fist    Baseline  approx 10% today    Time  4    Period  Weeks    Status  New    Target Date  02/07/19      PT LONG TERM GOAL #2   Title  wrist flx & ext to at least 50 deg    Baseline  see flowsheet    Time  4    Period  Weeks    Status  New    Target Date  02/07/19      PT LONG TERM GOAL #3   Title  Pt will be able to use dominant hand for daily activities- work and self care    Baseline  unable at eval    Time  4    Period  Weeks    Status  New    Target Date  02/07/19      PT LONG TERM GOAL #4   Title  pt will be able to lift and carry  light objects with Right hand    Baseline  unable at eval    Time  4    Period  Weeks    Status  New    Target Date  02/07/19             Plan - 01/10/19 1534    Clinical Impression Statement  Pt presents to PT 3 weeks s/p carpal tunnel release on Rt (dominant) hand. hand and fingers are significantly swollen and has very limited movement- able to acheve approx 10% of motion necessary to make a fist. Performed edema management today with continuous education on how to perform at home and educated on resting posture for arm. Good mobility noted when isolating IPs. Will monitor edema as her right ankle is very swollen and has been happening for a while, may need to consider compression garment. Proper healing of incision noted.    Personal Factors and Comorbidities  Comorbidity 1    Comorbidities  persistent edema in other body parts, HTN, RA    Examination-Activity Limitations  Bathing;Bed Mobility;Self Feeding;Carry;Sleep;Dressing;Hygiene/Grooming;Lift    Examination-Participation Restrictions  Meal Prep;Cleaning;Community Activity    Stability/Clinical Decision Making  Stable/Uncomplicated    Clinical Decision Making  Low    Rehab Potential  Good    PT Frequency  2x / week    PT Duration  3 weeks    PT Treatment/Interventions  ADLs/Self Care Home Management;Cryotherapy;Functional mobility training;Therapeutic activities;Therapeutic exercise;Patient/family education;Neuromuscular re-education;Manual techniques;Scar mobilization;Passive range of motion;Taping    PT Next Visit Plan  continue edema management, gross hand motion    PT Home Exercise Plan  edema mobilization, PROM    Consulted and Agree with Plan of Care  Patient       Patient will benefit from skilled  therapeutic intervention in order to improve the following deficits and impairments:  Decreased range of motion, Impaired UE functional use, Decreased activity tolerance, Impaired flexibility, Decreased strength  Visit  Diagnosis: Stiffness of right hand, not elsewhere classified - Plan: PT plan of care cert/re-cert  Localized edema - Plan: PT plan of care cert/re-cert     Problem List Patient Active Problem List   Diagnosis Date Noted  . Carpal tunnel syndrome, right upper limb 12/27/2018  . Localized edema 02/12/2018  . Essential hypertension 02/12/2018  . Chronic pain of right knee 02/12/2018  . Abdominal pain 05/03/2017    Jessica C. Hightower PT, DPT 01/10/19 4:45 PM   [facility] [address] [place], [place], [address] Phone: [phone]   Fax:  [phone]  Name: Tina Ware MRN: [id number] Date of Birth: [birth year]

## 2019-01-11 ENCOUNTER — Encounter: Payer: Self-pay | Admitting: Internal Medicine

## 2019-01-16 ENCOUNTER — Other Ambulatory Visit: Payer: Self-pay | Admitting: Internal Medicine

## 2019-01-18 ENCOUNTER — Encounter

## 2019-01-19 NOTE — Progress Notes (Signed)
Subjective:     [name] , female    DOB: 1940/12/19 , 78 y.o.   MRN: [id number]   Chief Complaint  Patient presents with  . Edema    follow up for lasix     HPI  She is here today for bp check. Lasix with three times week dosing was added to her regimen at her last visit. She has tolerated this without any issues. She has had carpal tunnel release and trigger finger release performed since her last visit. She is in quite a bit of pain today.     Past Medical History:  Diagnosis Date  . Arthritis    RA  . CAD (coronary artery disease)   . Fibroid, uterine   . Hypertension      Family History  Problem Relation Age of Onset  . Heart disease Mother   . Dementia Mother   . Parkinson's disease Father   . Pancreatic cancer Sister      Current Outpatient Medications:  .  aspirin EC 81 MG tablet, Take 81 mg by mouth daily., Disp: , Rfl:  .  benzonatate (TESSALON) 100 MG capsule, TAKE ONE CAPSULE BY MOUTH 3 TIMES A DAY AS NEEDED FOR COUGH, Disp: 30 capsule, Rfl: 1 .  Besifloxacin HCl (BESIVANCE) 0.6 % SUSP, Apply to eye., Disp: , Rfl:  .  Bromfenac Sodium (PROLENSA) 0.07 % SOLN, Apply to eye., Disp: , Rfl:  .  carvedilol (COREG) 6.25 MG tablet, TAKE 1 TABLET BY MOUTH TWICE A DAY WITH FOOD, Disp: 180 tablet, Rfl: 0 .  cholecalciferol (VITAMIN D) 1000 units tablet, Take 1,000 Units by mouth daily., Disp: , Rfl:  .  Difluprednate (DUREZOL) 0.05 % EMUL, Apply to eye., Disp: , Rfl:  .  furosemide (LASIX) 20 MG tablet, Take 1 tablet (20 mg total) by mouth daily., Disp: 90 tablet, Rfl: 0 .  HYDROcodone-acetaminophen (NORCO/VICODIN) 5-325 MG tablet, Take 1 tablet by mouth every 6 (six) hours as needed for moderate pain., Disp: 30 tablet, Rfl: 0 .  hydrocortisone (ANUSOL-HC) 25 MG suppository, Place 1 suppository (25 mg total) rectally 2 (two) times daily as needed for hemorrhoids or anal itching., Disp: 60 suppository, Rfl: 0 .  losartan (COZAAR) 50 MG tablet, Take 1  tablet (50 mg total) by mouth daily., Disp: 90 tablet, Rfl: 1 .  mometasone (NASONEX) 50 MCG/ACT nasal spray, Place 2 sprays into the nose daily as needed (allergies)., Disp: 17 g, Rfl: 1 .  Multiple Vitamin (MULTIVITAMIN WITH MINERALS) TABS tablet, Take 1 tablet by mouth daily., Disp: , Rfl:  .  Omega-3 1000 MG CAPS, Take 1,000 mg by mouth daily., Disp: , Rfl:  .  Pramoxine-HC (HYDROCORTISONE ACE-PRAMOXINE) 2.5-1 % CREA, Apply 1 Syringe topically 3 (three) times daily as needed., Disp: 1 Tube, Rfl: 3 .  traMADol (ULTRAM) 50 MG tablet, Take 1 tablet (50 mg total) by mouth at bedtime as needed for severe pain., Disp: 30 tablet, Rfl: 0   No Known Allergies   Review of Systems  Constitutional: Negative.   Respiratory: Negative.   Cardiovascular: Negative.   Gastrointestinal: Negative.   Musculoskeletal:       She has hand pain, s/p surgery.   Neurological: Negative.   Psychiatric/Behavioral: Negative.      Today's Vitals   01/09/19 1516  BP: (!) 148/82  Pulse: 82  Temp: 98.1 F (36.7 C)  TempSrc: Oral  SpO2: 97%  Weight: 164 lb (74.4 kg)  Height: 5' 1.8" (1.57 m)  Body mass index is 30.19 kg/m.   Objective:  Physical Exam Vitals signs and nursing note reviewed.  Constitutional:      Appearance: Normal appearance.  HENT:     Head: Normocephalic and atraumatic.  Cardiovascular:     Rate and Rhythm: Normal rate and regular rhythm.     Heart sounds: Normal heart sounds.  Pulmonary:     Effort: Pulmonary effort is normal.     Breath sounds: Normal breath sounds.  Musculoskeletal:     Right lower leg: 1+ Pitting Edema present.     Left lower leg: 1+ Pitting Edema present.     Comments: r hand swelling,   Skin:    General: Skin is warm.  Neurological:     General: No focal deficit present.     Mental Status: She is alert.  Psychiatric:        Mood and Affect: Mood normal.        Behavior: Behavior normal.         Assessment And Plan:     1. Essential  hypertension, benign  Chronic, fair control. She will continue with current meds for now. Her BP elevation is likely exacerbated by her pain.   - BMP8+EGFR  2. Bilateral lower extremity edema  Slightly improved. She will continue with lasix. Encouraged to limit her salt intake. Importance of dietary compliance was stressed to the patient.   3. Hemorrhoids, unspecified hemorrhoid type  Intermittent. She was given rx Anusol-HC suppositories to use as needed.   4. Right hand pain  She is s/p surgery. There is quite a bit of swelling noted. She is advised to keep her arm/hand elevated as much as possible.   5. Allergic rhinitis, unspecified seasonality, unspecified trigger  She was given rx Nasonex NS to use one spray each nostril once daily. She will let me know if her sx persist.   6. Flu vaccine need  She was given high dose flu vaccine to update her immunization history.   [name], MD    THE PATIENT IS ENCOURAGED TO PRACTICE SOCIAL DISTANCING DUE TO THE COVID-19 PANDEMIC.

## 2019-01-23 ENCOUNTER — Other Ambulatory Visit: Payer: Self-pay

## 2019-01-23 ENCOUNTER — Encounter: Payer: Self-pay | Admitting: Physical Therapy

## 2019-01-23 ENCOUNTER — Ambulatory Visit: Payer: Medicare Other | Admitting: Physical Therapy

## 2019-01-23 ENCOUNTER — Ambulatory Visit: Payer: Medicare Other | Admitting: Nurse Practitioner

## 2019-01-23 ENCOUNTER — Encounter: Payer: Self-pay | Admitting: Nurse Practitioner

## 2019-01-23 VITALS — BP 142/100 | HR 85 | Temp 98.9°F | Ht 62.0 in | Wt 167.6 lb

## 2019-01-23 DIAGNOSIS — R600 Localized edema: Secondary | ICD-10-CM

## 2019-01-23 DIAGNOSIS — I10 Essential (primary) hypertension: Secondary | ICD-10-CM

## 2019-01-23 DIAGNOSIS — R195 Other fecal abnormalities: Secondary | ICD-10-CM

## 2019-01-23 DIAGNOSIS — M25641 Stiffness of right hand, not elsewhere classified: Secondary | ICD-10-CM | POA: Diagnosis not present

## 2019-01-23 MED ORDER — MAGNESIUM 250 MG PO TABS: ORAL_TABLET | ORAL | 0 refills | Status: DC

## 2019-01-23 NOTE — Progress Notes (Addendum)
Subjective:     [name] , female    DOB: [birth year] , 78 y.o.   MRN: [id number]   Chief Complaint  Patient presents with  . Blood In Stools    patient stated this past sunday she noticed some blood in her stool. patient stated she has been constipated since her surgery and she now has hemorriods      HPI  She is bleeding from her rectum since Sunday, she had carpal tunnel surgery in September 24th.  Bright red blood.  She has hemorrhoids, she is still having bleeding with soft stools. It is in the toilet. The blood is about the same.   She has been to Dr. Collene Ware but could not have her colonoscopy due to her blood pressure being.       Past Medical History:  Diagnosis Date  . Arthritis    RA  . CAD (coronary artery disease)   . Fibroid, uterine   . Hypertension      Family History  Problem Relation Age of Onset  . Heart disease Mother   . Dementia Mother   . Parkinson's disease Father   . Pancreatic cancer Sister      Current Outpatient Medications:  .  aspirin EC 81 MG tablet, Take 81 mg by mouth daily., Disp: , Rfl:  .  benzonatate (TESSALON) 100 MG capsule, TAKE ONE CAPSULE BY MOUTH 3 TIMES A DAY AS NEEDED FOR COUGH, Disp: 30 capsule, Rfl: 1 .  carvedilol (COREG) 6.25 MG tablet, TAKE 1 TABLET BY MOUTH TWICE A DAY WITH FOOD, Disp: 180 tablet, Rfl: 0 .  cholecalciferol (VITAMIN D) 1000 units tablet, Take 1,000 Units by mouth daily., Disp: , Rfl:  .  Difluprednate (DUREZOL) 0.05 % EMUL, Apply to eye., Disp: , Rfl:  .  furosemide (LASIX) 20 MG tablet, Take 1 tablet (20 mg total) by mouth daily., Disp: 90 tablet, Rfl: 0 .  HYDROcodone-acetaminophen (NORCO/VICODIN) 5-325 MG tablet, Take 1 tablet by mouth every 6 (six) hours as needed for moderate pain., Disp: 30 tablet, Rfl: 0 .  hydrocortisone (ANUSOL-HC) 25 MG suppository, Place 1 suppository (25 mg total) rectally 2 (two) times daily as needed for hemorrhoids or anal itching., Disp: 60 suppository, Rfl:  0 .  losartan (COZAAR) 50 MG tablet, Take 1 tablet (50 mg total) by mouth daily., Disp: 90 tablet, Rfl: 1 .  mometasone (NASONEX) 50 MCG/ACT nasal spray, Place 2 sprays into the nose daily as needed (allergies)., Disp: 17 g, Rfl: 1 .  Multiple Vitamin (MULTIVITAMIN WITH MINERALS) TABS tablet, Take 1 tablet by mouth daily., Disp: , Rfl:  .  Omega-3 1000 MG CAPS, Take 1,000 mg by mouth daily., Disp: , Rfl:  .  Pramoxine-HC (HYDROCORTISONE ACE-PRAMOXINE) 2.5-1 % CREA, Apply 1 Syringe topically 3 (three) times daily as needed., Disp: 1 Tube, Rfl: 3 .  traMADol (ULTRAM) 50 MG tablet, Take 1 tablet (50 mg total) by mouth at bedtime as needed for severe pain., Disp: 30 tablet, Rfl: 0   No Known Allergies   Review of Systems  Constitutional: Negative.   Respiratory: Negative.   Cardiovascular: Positive for leg swelling.  Gastrointestinal: Positive for blood in stool. Negative for abdominal pain.  Musculoskeletal:       Right hand swelling from carpal tunnel surgery  Neurological: Negative for dizziness and headaches.  Psychiatric/Behavioral: Negative.      Today's Vitals   01/23/19 1621  BP: (!) 142/100  Pulse: 85  Temp: 98.9 F (37.2  C)  TempSrc: Oral  Weight: 167 lb 9.6 oz (76 kg)  Height: 5\' 2"  (1.575 m)  PainSc: 0-No pain   Body mass index is 30.65 kg/m.   Objective:  Physical Exam Constitutional:      Appearance: Normal appearance.  Cardiovascular:     Pulses: Normal pulses.     Heart sounds: Normal heart sounds. No murmur.  Pulmonary:     Effort: Pulmonary effort is normal. No respiratory distress.     Breath sounds: Normal breath sounds.  Genitourinary:    Rectum: Guaiac result positive (positive).  Musculoskeletal:     Right lower leg: No edema.     Left lower leg: Edema (1+ edema) present.  Skin:    Capillary Refill: Capillary refill takes less than 2 seconds.  Neurological:     General: No focal deficit present.     Mental Status: She is alert and oriented to  person, place, and time.         Assessment And Plan:    1. Occult blood in stools Will refer to GI for further evaluation She is also to use anusol suppositories for her hemorrhoids I did not feel any with manual exam - CBC no Diff - Ambulatory referral to Gastroenterology  2. Essential hypertension Elevated today, unsure of cause however she is going to take an extra lasix for a couple of days She is to add magnesium to her regimen - Magnesium 250 MG TABS; Take 1 tablet by mouth with evening meal  Dispense: 30 tablet; Refill: 0  3. Localized edema She can take an extra furosemide to 40 mg on Thur and Fri and call on Monday to let us know how you are doing.   Tina Brine, FNP    THE PATIENT IS ENCOURAGED TO PRACTICE SOCIAL DISTANCING DUE TO THE COVID-19 PANDEMIC.

## 2019-01-24 ENCOUNTER — Encounter: Payer: Self-pay | Admitting: Orthopaedic Surgery

## 2019-01-24 ENCOUNTER — Encounter: Payer: Self-pay | Admitting: Physical Therapy

## 2019-01-24 ENCOUNTER — Ambulatory Visit (INDEPENDENT_AMBULATORY_CARE_PROVIDER_SITE_OTHER): Payer: Medicare Other | Admitting: Orthopaedic Surgery

## 2019-01-24 VITALS — Ht 62.0 in | Wt 162.0 lb

## 2019-01-24 DIAGNOSIS — Z9889 Other specified postprocedural states: Secondary | ICD-10-CM

## 2019-01-24 DIAGNOSIS — G5601 Carpal tunnel syndrome, right upper limb: Secondary | ICD-10-CM

## 2019-01-24 LAB — CBC
Hematocrit: 40.8 % (ref 34.0–46.6)
Hemoglobin: 13.9 g/dL (ref 11.1–15.9)
MCH: 29.9 pg (ref 26.6–33.0)
MCHC: 34.1 g/dL (ref 31.5–35.7)
MCV: 88 fL (ref 79–97)
Platelets: 187 10*3/uL (ref 150–450)
RBC: 4.65 x10E6/uL (ref 3.77–5.28)
RDW: 13.1 % (ref 11.7–15.4)
WBC: 3.7 10*3/uL (ref 3.4–10.8)

## 2019-01-24 NOTE — Progress Notes (Signed)
Office Visit Note   Patient: Tina Ware           Date of Birth: 07-04-1940           MRN: [id number] Visit Date: 01/24/2019              Requested by: Glendale Chard, [address] [place],  [place] [address] PCP: Glendale Chard, MD   Assessment & Plan: Visit Diagnoses:  1. Status post trigger finger release   2. Carpal tunnel syndrome, right upper limb     Plan:  #1: Consultation with Occupational Therapy has been ordered for her. #2: She needs to progress at home with more range of motion all the fingers as well as the thumb.  Follow-Up Instructions: No follow-ups on file.   Orders:  No orders of the defined types were placed in this encounter.  No orders of the defined types were placed in this encounter.     Procedures: No procedures performed   Clinical Data: No additional findings.   Subjective: No chief complaint on file.   HPI  Tina Ware is now proximate 5 weeks status post right carpal tunnel release and right trigger finger release.  She has been doing physical therapy however she states that they were more concerned about having her do occupational therapy.  She is having difficulty with making a fist.  Also she lacks full extension at the same time.  States still has a little bit of sensation deficit none regards to tingling in her fingertips.  Otherwise she states she is improved.  Review of Systems  Constitutional: Negative for fatigue.  HENT: Negative for ear pain.   Eyes: Negative for pain.  Respiratory: Negative for shortness of breath.   Cardiovascular: Positive for leg swelling.  Gastrointestinal: Positive for constipation. Negative for diarrhea.  Endocrine: Negative for cold intolerance and heat intolerance.  Genitourinary: Negative for difficulty urinating.  Musculoskeletal: Negative for joint swelling.  Skin: Negative for rash.  Allergic/Immunologic: Negative for food allergies.  Neurological: Negative for weakness.   Hematological: Does not bruise/bleed easily.  Psychiatric/Behavioral: Negative for sleep disturbance.     Objective: Vital Signs: Ht 5\' 2"  (1.575 m)   Wt 162 lb (73.5 kg)   BMI 29.63 kg/m   Physical Exam Constitutional:      Appearance: Normal appearance. She is well-developed.  Eyes:     Pupils: Pupils are equal, round, and reactive to light.  Pulmonary:     Effort: Pulmonary effort is normal.  Skin:    General: Skin is warm and dry.  Neurological:     Mental Status: She is alert and oriented to person, place, and time.  Psychiatric:        Behavior: Behavior normal.     Ortho Exam  Exam today reveals marked swelling of the hand.  Her creases  dorsally of the PIP DIP joints are minimally present.  She does have some decreased sensation at the tip of the fingers.  Cap refill is less than 2 seconds.  Her hand is warm.  Specialty Comments:  No specialty comments available.  Imaging: No results found.   PMFS History: Patient Active Problem List   Diagnosis Date Noted  . Status post trigger finger release 01/24/2019  . Carpal tunnel syndrome, right upper limb 12/27/2018  . Localized edema 02/12/2018  . Essential hypertension 02/12/2018  . Chronic pain of right knee 02/12/2018  . Abdominal pain 05/03/2017   Past Medical History:  Diagnosis Date  .  Arthritis    RA  . CAD (coronary artery disease)   . Fibroid, uterine   . Hypertension     Family History  Problem Relation Age of Onset  . Heart disease Mother   . Dementia Mother   . Parkinson's disease Father   . Pancreatic cancer Sister     Past Surgical History:  Procedure Laterality Date  . CARPAL TUNNEL RELEASE Right 12/20/2018  . CATARACT EXTRACTION, BILATERAL    . CHOLECYSTECTOMY    . TRIGGER FINGER RELEASE Right 12/20/2018   Social History   Occupational History  . Not on file  Tobacco Use  . Smoking status: Never Smoker  . Smokeless tobacco: Never Used  Substance and Sexual Activity  .  Alcohol use: No    Alcohol/week: 0.0 standard drinks  . Drug use: No  . Sexual activity: Not Currently

## 2019-01-24 NOTE — Progress Notes (Signed)
Patient reports she is doing ok s/p Rt CTR and trigger finger release on 12/20/18.  Making slow progress and going to physical therapy. States she is still having some soreness.

## 2019-01-24 NOTE — Therapy (Signed)
[place], [place], [address] Phone: [phone]   Fax:  [phone]  Physical Therapy Treatment  Patient Details  Name: Tina Ware MRN: [id number] Date of Birth: 04-29-1940 Referring Provider (PT): Joni Fears, MD   Encounter Date: 01/23/2019  PT End of Session - 01/23/19 1414    Visit Number  2    Number of Visits  7    Date for PT Re-Evaluation  02/08/19    Authorization Type  UHC    PT Start Time  1330    PT Stop Time  1412    PT Time Calculation (min)  42 min    Activity Tolerance  Patient tolerated treatment well    Behavior During Therapy  [facility] for tasks assessed/performed       Past Medical History:  Diagnosis Date  . Arthritis    RA  . CAD (coronary artery disease)   . Fibroid, uterine   . Hypertension     Past Surgical History:  Procedure Laterality Date  . CARPAL TUNNEL RELEASE Right 12/20/2018  . CATARACT EXTRACTION, BILATERAL    . CHOLECYSTECTOMY    . TRIGGER FINGER RELEASE Right 12/20/2018    There were no vitals filed for this visit.  Subjective Assessment - 01/23/19 1402    Subjective  Patient continues to have swelling but she feels like it is better. She has been doing hte exercises that she can at home. Her pain is controlled today but still around a 6/10    Patient Stated Goals  use dominant hand    Currently in Pain?  Yes    Pain Score  6     Pain Location  Hand    Pain Orientation  Right    Pain Descriptors / Indicators  Aching    Pain Type  Acute pain;Surgical pain    Pain Onset  More than a month ago    Pain Frequency  Constant    Aggravating Factors   use of the hand    Pain Relieving Factors  keeping it elevated                       [facility] Adult PT Treatment/Exercise - 01/24/19 0001      Hand Exercises   Opposition Limitations  could only rach lower first digit; worked on getting to the second digit     Other Hand Exercises   towel squeeze 2x10;       Wrist Exercises   Wrist Flexion Limitations  active wrist flexion 2x10     Wrist Extension Limitations  AROM 2x10     Wrist Radial Deviation Limitations  AROM x10     Wrist Ulnar Deviation Limitations  AROM x10     Other wrist exercises  elbown pronation supination 2x10; elbow flexion and extension 2x10       Manual Therapy   Edema Management  right hand edema massgae. Therapy careful not to stretch patients wound as it is only partially healed     Passive ROM  IPs and wrist; focused on middle finger extension where trigger point release was done             PT Education - 01/23/19 1414    Education Details  HEP and symptom managment, updated HEP    Person(s) Educated  Patient    Methods  Explanation;Demonstration;Tactile cues;Verbal cues    Comprehension  Verbalized understanding;Returned demonstration;Tactile cues required;Verbal cues required  PT Long Term Goals - 01/10/19 1634      PT LONG TERM GOAL #1   Title  Pt will be able to make a full fist    Baseline  approx 10% today    Time  4    Period  Weeks    Status  New    Target Date  02/07/19      PT LONG TERM GOAL #2   Title  wrist flx & ext to at least 50 deg    Baseline  see flowsheet    Time  4    Period  Weeks    Status  New    Target Date  02/07/19      PT LONG TERM GOAL #3   Title  Pt will be able to use dominant hand for daily activities- work and self care    Baseline  unable at eval    Time  4    Period  Weeks    Status  New    Target Date  02/07/19      PT LONG TERM GOAL #4   Title  pt will be able to lift and carry light objects with Right hand    Baseline  unable at eval    Time  4    Period  Weeks    Status  New    Target Date  02/07/19            Plan - 01/23/19 1415    Clinical Impression Statement  Per visual inspection her movement has improved sinvce the initial visit. Therapy added finger oposition today. She was only able to reach the  first finger. Therapy worked on all motions of the wirst. She tolerated well. She was given finger opposition and a light towel squeeze for home.    Personal Factors and Comorbidities  Comorbidity 1    Comorbidities  persistent edema in other body parts, HTN, RA    Examination-Activity Limitations  Bathing;Bed Mobility;Self Feeding;Carry;Sleep;Dressing;Hygiene/Grooming;Lift    Examination-Participation Restrictions  Meal Prep;Cleaning;Community Activity    Stability/Clinical Decision Making  Stable/Uncomplicated    Clinical Decision Making  Low    Rehab Potential  Good    PT Frequency  2x / week    PT Duration  3 weeks    PT Treatment/Interventions  ADLs/Self Care Home Management;Cryotherapy;Functional mobility training;Therapeutic activities;Therapeutic exercise;Patient/family education;Neuromuscular re-education;Manual techniques;Scar mobilization;Passive range of motion;Taping    PT Next Visit Plan  continue edema management, gross hand motion    PT Home Exercise Plan  edema mobilization, PROM    Consulted and Agree with Plan of Care  Patient       Patient will benefit from skilled therapeutic intervention in order to improve the following deficits and impairments:     Visit Diagnosis: Stiffness of right hand, not elsewhere classified  Localized edema     Problem List Patient Active Problem List   Diagnosis Date Noted  . Carpal tunnel syndrome, right upper limb 12/27/2018  . Localized edema 02/12/2018  . Essential hypertension 02/12/2018  . Chronic pain of right knee 02/12/2018  . Abdominal pain 05/03/2017    Carney Living PT DPT  01/24/2019, 1:19 PM  [facility] [address] [place], [place], [address] Phone: [phone]   Fax:  [phone]  Name: SADEEL WEISSBERG MRN: [id number] Date of Birth: [birth year]

## 2019-01-25 ENCOUNTER — Other Ambulatory Visit: Payer: Self-pay

## 2019-01-25 ENCOUNTER — Encounter: Payer: Self-pay | Admitting: Nurse Practitioner

## 2019-01-25 ENCOUNTER — Ambulatory Visit: Payer: Medicare Other | Admitting: Physical Therapy

## 2019-01-25 ENCOUNTER — Encounter: Payer: Self-pay | Admitting: Physical Therapy

## 2019-01-25 DIAGNOSIS — R600 Localized edema: Secondary | ICD-10-CM

## 2019-01-25 DIAGNOSIS — M25641 Stiffness of right hand, not elsewhere classified: Secondary | ICD-10-CM

## 2019-01-25 NOTE — Therapy (Signed)
[place], [place], [address] Phone: [phone]   Fax:  [phone]  Physical Therapy Treatment  Patient Details  Name: Tina Ware MRN: [id number] Date of Birth: 10/23/1940 Referring Provider (PT): Joni Fears, MD   Encounter Date: 01/25/2019  PT End of Session - 01/25/19 1035    Visit Number  3    Number of Visits  7    Date for PT Re-Evaluation  02/08/19    Authorization Type  UHC    PT Start Time  [phone]    PT Stop Time  1024    PT Time Calculation (min)  46 min    Activity Tolerance  Patient tolerated treatment well    Behavior During Therapy  [facility] for tasks assessed/performed       Past Medical History:  Diagnosis Date  . Arthritis    RA  . CAD (coronary artery disease)   . Fibroid, uterine   . Hypertension     Past Surgical History:  Procedure Laterality Date  . CARPAL TUNNEL RELEASE Right 12/20/2018  . CATARACT EXTRACTION, BILATERAL    . CHOLECYSTECTOMY    . TRIGGER FINGER RELEASE Right 12/20/2018    There were no vitals filed for this visit.  Subjective Assessment - 01/25/19 0941    Subjective  Pt still with swelling but better than it was.  Ms. Drilling was able to drive herself today to clinic    Currently in Pain?  Yes    Pain Score  0-No pain    Pain Location  Hand    Pain Orientation  Right    Pain Descriptors / Indicators  Aching    Pain Type  Acute pain;Surgical pain    Pain Onset  More than a month ago         [facility] PT Assessment - 01/25/19 0001      Assessment   Medical Diagnosis  Rt carpal tunnel syndrome    Referring Provider (PT)  Joni Fears, MD      Observation/Other Assessments-Edema    Edema  Circumferential      Circumferential Edema   Circumferential - Right  21 cm    Circumferential - Left   7.75 cm      Sensation   Additional Comments  less numb and sore      AROM   Right Wrist Extension  38 Degrees    Right Wrist Flexion  46  Degrees                   [facility] Adult PT Treatment/Exercise - 01/25/19 0944      Hand Exercises   Opposition Limitations  able to reache 1st DIP with first didit and 2nd PIP on second digit with thumb    Other Hand Exercises  towel squeeze 2x10;    with 5 sec hold VC and TC     Wrist Exercises   Wrist Flexion Limitations  active wrist flexion 2 x 10    Wrist Extension Limitations  AROM 2 x 10    Wrist Radial Deviation Limitations  AROM x 10    Wrist Ulnar Deviation Limitations  AROM x 10    Other wrist exercises  elbown pronation supination 2x10; elbow flexion and extension 2x10       Manual Therapy   Manual Therapy  Edema management    Edema Management  right hand edema massgae. Therapy careful not to stretch patients wound as it is only partially  healed     Passive ROM  IPs and wrist; focused on middle finger extension where trigger point release was done             PT Education - 01/25/19 1031    Education Details  Edema management at home with ice pack and cold water after exercises.  Educated on movement throughout day important for edema and not holding in dependent position    Person(s) Educated  Patient    Methods  Explanation    Comprehension  Verbalized understanding          PT Long Term Goals - 01/25/19 1027      PT LONG TERM GOAL #1   Title  Pt will be able to make a full fist    Baseline  approximiately 25% today    Time  4    Period  Weeks    Status  On-going    Target Date  02/07/19      PT LONG TERM GOAL #2   Title  wrist flx & ext to at least 50 deg    Baseline  wrist ext 38, and flexion 46    Time  4    Period  Weeks    Status  On-going      PT LONG TERM GOAL #3   Title  Pt will be able to use dominant hand for daily activities- work and self care    Baseline  unable to use functionally    Time  4    Period  Weeks    Status  On-going      PT LONG TERM GOAL #4   Title  pt will be able to lift and carry light objects with  Right hand    Baseline  unable to lift items with RT hand  trying to use computer mouse but unable    Time  4    Period  Weeks    Status  On-going            Plan - 01/25/19 1011    Clinical Impression Statement  Pt is improving in swelling from eval.  Edema measurement around RT metacarpals 21 cm, and LT 7.75cm. but visually improved from eval.  RT Wrist flex 46 and wrist ext improved from eval to 38 degrees.  Pt encouraged to do more wrist movement and finger opposition during the day to encourage decreased swelling throughout day.  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    Personal Factors and Comorbidities  Comorbidity 1    Comorbidities  persistent edema in other body parts, HTN, RA    Examination-Activity Limitations  Bathing;Bed Mobility;Self Feeding;Carry;Sleep;Dressing;Hygiene/Grooming;Lift    Examination-Participation Restrictions  Meal Prep;Cleaning;Community Activity    Stability/Clinical Decision Making  Stable/Uncomplicated    Clinical Decision Making  Low    Rehab Potential  Good    PT Frequency  2x / week    PT Treatment/Interventions  ADLs/Self Care Home Management;Cryotherapy;Functional mobility training;Therapeutic activities;Therapeutic exercise;Patient/family education;Neuromuscular re-education;Manual techniques;Scar mobilization;Passive range of motion;Taping    PT Next Visit Plan  continue edema management, gross hand motion    PT Home Exercise Plan  edema mobilization, PROM    Consulted and Agree with Plan of Care  Patient       Patient will benefit from skilled therapeutic intervention in order to improve the following deficits and impairments:  Decreased range of motion, Impaired UE functional use, Decreased activity tolerance, Impaired flexibility, Decreased strength  Visit  Diagnosis: Stiffness of right hand, not elsewhere classified  Localized edema     Problem List Patient Active Problem List   Diagnosis Date Noted   . Status post trigger finger release 01/24/2019  . Carpal tunnel syndrome, right upper limb 12/27/2018  . Localized edema 02/12/2018  . Essential hypertension 02/12/2018  . Chronic pain of right knee 02/12/2018  . Abdominal pain 05/03/2017   Voncille Lo, PT Certified Exercise Expert for the Aging Adult  01/25/19 10:36 AM Phone: [phone] Fax: [facility] [address] [place], [place], [address] Phone: [phone]   Fax:  [phone]  Name: Tina Ware MRN: [id number] Date of Birth: 10/23/1940

## 2019-01-28 ENCOUNTER — Other Ambulatory Visit: Payer: Self-pay

## 2019-01-28 ENCOUNTER — Encounter: Payer: Self-pay | Admitting: Physical Therapy

## 2019-01-28 ENCOUNTER — Ambulatory Visit: Payer: Medicare Other | Attending: Orthopaedic Surgery | Admitting: Physical Therapy

## 2019-01-28 DIAGNOSIS — R600 Localized edema: Secondary | ICD-10-CM | POA: Diagnosis present

## 2019-01-28 DIAGNOSIS — M25641 Stiffness of right hand, not elsewhere classified: Secondary | ICD-10-CM | POA: Diagnosis not present

## 2019-01-28 NOTE — Therapy (Signed)
[place], [place], [address] Phone: [phone]   Fax:  [phone]  Physical Therapy Treatment  Patient Details  Name: Tina Ware MRN: [id number] Date of Birth: April 03, 1940 Referring Provider (PT): Joni Fears, MD   Encounter Date: 01/28/2019  PT End of Session - 01/28/19 1355    Visit Number  4    Number of Visits  7    Date for PT Re-Evaluation  02/08/19    Authorization Type  UHC    PT Start Time  1330    PT Stop Time  1412    PT Time Calculation (min)  42 min    Activity Tolerance  Patient tolerated treatment well    Behavior During Therapy  [facility] for tasks assessed/performed       Past Medical History:  Diagnosis Date  . Arthritis    RA  . CAD (coronary artery disease)   . Fibroid, uterine   . Hypertension     Past Surgical History:  Procedure Laterality Date  . CARPAL TUNNEL RELEASE Right 12/20/2018  . CATARACT EXTRACTION, BILATERAL    . CHOLECYSTECTOMY    . TRIGGER FINGER RELEASE Right 12/20/2018    There were no vitals filed for this visit.  Subjective Assessment - 01/28/19 1336    Subjective  Patient reports no pain but her hand is still swollen. She feels like she is able to use it more.    Patient Stated Goals  use dominant hand    Currently in Pain?  No/denies                       [facility] Adult PT Treatment/Exercise - 01/28/19 0001      Hand Exercises   Opposition Limitations  able to reach tip of first finger. able to get to the PIP on the 2nd finger     Other Hand Exercises  putty key grip x20; putty pull 2x10; key grip roll 2 laps small velcro; thumb abduction with rubber band; hand opening x20;       Wrist Exercises   Wrist Flexion Limitations  active wrist flexion 2x10     Wrist Extension Limitations  tried with weight 2x10     Other wrist exercises  elbown pronation supination 2x10; elbow flexion and extension 2x10     Other wrist exercises   bicpes curl 2x10 1lb       Manual Therapy   Manual Therapy  Edema management    Edema Management  right hand edema massgae. Therapy careful not to stretch patients wound as it is only partially healed     Passive ROM  IPs and wrist; focused on middle finger extension where trigger point release was done             PT Education - 01/28/19 1351    Education Details  reviewed HEP with patient    Person(s) Educated  Patient    Methods  Explanation;Demonstration;Verbal cues    Comprehension  Verbalized understanding;Returned demonstration;Verbal cues required;Tactile cues required          PT Long Term Goals - 01/25/19 1027      PT LONG TERM GOAL #1   Title  Pt will be able to make a full fist    Baseline  approximiately 25% today    Time  4    Period  Weeks    Status  On-going    Target Date  02/07/19  PT LONG TERM GOAL #2   Title  wrist flx & ext to at least 50 deg    Baseline  wrist ext 38, and flexion 46    Time  4    Period  Weeks    Status  On-going      PT LONG TERM GOAL #3   Title  Pt will be able to use dominant hand for daily activities- work and self care    Baseline  unable to use functionally    Time  4    Period  Weeks    Status  On-going      PT LONG TERM GOAL #4   Title  pt will be able to lift and carry light objects with Right hand    Baseline  unable to lift items with RT hand  trying to use computer mouse but unable    Time  4    Period  Weeks    Status  On-going            Plan - 01/28/19 1356    Clinical Impression Statement  Therapy added thumb abduction and rubber band thumb flexion. Therapy also added rubber band hand opening. Patient had no increase in pain. Patient was given exercises for home. Therapy reviewed self edema managment for home.    Personal Factors and Comorbidities  Comorbidity 1    Comorbidities  persistent edema in other body parts, HTN, RA    Examination-Activity Limitations  Bathing;Bed Mobility;Self  Feeding;Carry;Sleep;Dressing;Hygiene/Grooming;Lift    Examination-Participation Restrictions  Meal Prep;Cleaning;Community Activity    Stability/Clinical Decision Making  Stable/Uncomplicated    Clinical Decision Making  Low    Rehab Potential  Good    PT Frequency  2x / week    PT Duration  3 weeks    PT Treatment/Interventions  ADLs/Self Care Home Management;Cryotherapy;Functional mobility training;Therapeutic activities;Therapeutic exercise;Patient/family education;Neuromuscular re-education;Manual techniques;Scar mobilization;Passive range of motion;Taping    PT Next Visit Plan  continue edema management, gross hand motion       Patient will benefit from skilled therapeutic intervention in order to improve the following deficits and impairments:  Decreased range of motion, Impaired UE functional use, Decreased activity tolerance, Impaired flexibility, Decreased strength  Visit Diagnosis: Stiffness of right hand, not elsewhere classified  Localized edema     Problem List Patient Active Problem List   Diagnosis Date Noted  . Status post trigger finger release 01/24/2019  . Carpal tunnel syndrome, right upper limb 12/27/2018  . Localized edema 02/12/2018  . Essential hypertension 02/12/2018  . Chronic pain of right knee 02/12/2018  . Abdominal pain 05/03/2017    Carney Living PT DPT  01/28/2019, 9:12 PM  [facility] [address] [place], [place], [address] Phone: [phone]   Fax:  [phone]  Name: Tina Ware MRN: [id number] Date of Birth: [birth year]

## 2019-01-30 ENCOUNTER — Other Ambulatory Visit: Payer: Self-pay

## 2019-01-30 ENCOUNTER — Ambulatory Visit: Payer: Medicare Other | Admitting: Physical Therapy

## 2019-01-30 DIAGNOSIS — R600 Localized edema: Secondary | ICD-10-CM

## 2019-01-30 DIAGNOSIS — M25641 Stiffness of right hand, not elsewhere classified: Secondary | ICD-10-CM | POA: Diagnosis not present

## 2019-01-31 ENCOUNTER — Encounter: Payer: Self-pay | Admitting: Physical Therapy

## 2019-01-31 NOTE — Therapy (Signed)
[place], [place], [address] Phone: [phone]   Fax:  [phone]  Physical Therapy Treatment  Patient Details  Name: Tina Ware MRN: [id number] Date of Birth: 1941/02/09 Referring Provider (PT): Joni Fears, MD   Encounter Date: 01/30/2019  PT End of Session - 01/31/19 0748    Visit Number  5    Number of Visits  7    Date for PT Re-Evaluation  02/08/19    PT Start Time  1330    PT Stop Time  1412    PT Time Calculation (min)  42 min    Activity Tolerance  Patient tolerated treatment well    Behavior During Therapy  [facility] for tasks assessed/performed       Past Medical History:  Diagnosis Date  . Arthritis    RA  . CAD (coronary artery disease)   . Fibroid, uterine   . Hypertension     Past Surgical History:  Procedure Laterality Date  . CARPAL TUNNEL RELEASE Right 12/20/2018  . CATARACT EXTRACTION, BILATERAL    . CHOLECYSTECTOMY    . TRIGGER FINGER RELEASE Right 12/20/2018    There were no vitals filed for this visit.  Subjective Assessment - 01/30/19 1409    Subjective  Patient reports that her hand was improved yesterday. She had less swelling and improved movement. She was able to use her hand for funtional tasks. Today it is back swollen agian.    Patient Stated Goals  use dominant hand    Currently in Pain?  --   No pain at this time. Just has a sharp pain from time to time.                      [facility] Adult PT Treatment/Exercise - 01/31/19 0001      Hand Exercises   Opposition Limitations  able to get to the third finger     Other Hand Exercises  putty key grip x20; putty pull 2x10; key grip roll 2 laps small velcro; thumb abduction with rubber band; hand opening x20;       Wrist Exercises   Wrist Flexion Limitations  active wrist flexion     Wrist Extension Limitations  active wrist extension     Other wrist exercises  elbown pronation supination  2x10; elbow flexion and extension 2x10     Other wrist exercises  bicpes curl 2x10 1lb       Manual Therapy   Manual Therapy  Edema management    Edema Management  right hand edema massgae. Therapy careful not to stretch patients wound as it is only partially healed     Passive ROM  IPs and wrist; focused on middle finger extension where trigger point release was done             PT Education - 01/31/19 0747    Education Details  reviewed HEP and self edema management    Person(s) Educated  Patient    Methods  Explanation;Demonstration;Tactile cues;Verbal cues    Comprehension  Verbalized understanding;Verbal cues required;Tactile cues required;Returned demonstration          PT Long Term Goals - 01/31/19 0757      PT LONG TERM GOAL #1   Title  Pt will be able to make a full fist    Baseline  approximiately 25% today    Time  4    Period  Weeks    Status  On-going      PT LONG TERM GOAL #2   Title  wrist flx & ext to at least 50 deg    Baseline  wrist ext 38, and flexion 46    Time  4    Period  Weeks    Status  On-going      PT LONG TERM GOAL #3   Title  Pt will be able to use dominant hand for daily activities- work and self care    Baseline  unable to use functionally    Time  4    Period  Weeks    Status  On-going      PT LONG TERM GOAL #4   Title  pt will be able to lift and carry light objects with Right hand    Baseline  unable to lift items with RT hand  trying to use computer mouse but unable    Time  4    Period  Weeks    Status  On-going            Plan - 01/31/19 0752    Clinical Impression Statement  Patient had increased swelling today but it imprvoed with treatment. Therapy reviewed self edema mangement. She continues to show improvements in wrist and thumb motion. Therapy had her picking up marbles today which was difficult for her. Therapy will continue toprogress exercises as tolerated.    Personal Factors and Comorbidities   Comorbidity 1    Comorbidities  persistent edema in other body parts, HTN, RA    Examination-Activity Limitations  Bathing;Bed Mobility;Self Feeding;Carry;Sleep;Dressing;Hygiene/Grooming;Lift    Examination-Participation Restrictions  Meal Prep;Cleaning;Community Activity    Stability/Clinical Decision Making  Stable/Uncomplicated    Clinical Decision Making  Low    Rehab Potential  Good    PT Frequency  2x / week    PT Duration  3 weeks    PT Treatment/Interventions  ADLs/Self Care Home Management;Cryotherapy;Functional mobility training;Therapeutic activities;Therapeutic exercise;Patient/family education;Neuromuscular re-education;Manual techniques;Scar mobilization;Passive range of motion;Taping    PT Next Visit Plan  continue edema management, gross hand motion    PT Home Exercise Plan  edema mobilization, PROM    Consulted and Agree with Plan of Care  Patient       Patient will benefit from skilled therapeutic intervention in order to improve the following deficits and impairments:  Decreased range of motion, Impaired UE functional use, Decreased activity tolerance, Impaired flexibility, Decreased strength  Visit Diagnosis: Stiffness of right hand, not elsewhere classified  Localized edema     Problem List Patient Active Problem List   Diagnosis Date Noted  . Status post trigger finger release 01/24/2019  . Carpal tunnel syndrome, right upper limb 12/27/2018  . Localized edema 02/12/2018  . Essential hypertension 02/12/2018  . Chronic pain of right knee 02/12/2018  . Abdominal pain 05/03/2017    Carney Living PT DPT  01/31/2019, 8:06 AM  [facility] [address] [place], [place], [address] Phone: [phone]   Fax:  [phone]  Name: Tina Ware MRN: [id number] Date of Birth: 11-Jun-1940

## 2019-02-01 ENCOUNTER — Other Ambulatory Visit: Payer: Self-pay | Admitting: Nurse Practitioner

## 2019-02-04 ENCOUNTER — Ambulatory Visit: Payer: Medicare Other | Admitting: Physical Therapy

## 2019-02-04 ENCOUNTER — Other Ambulatory Visit: Payer: Self-pay

## 2019-02-04 ENCOUNTER — Encounter: Payer: Self-pay | Admitting: Physical Therapy

## 2019-02-04 DIAGNOSIS — M25641 Stiffness of right hand, not elsewhere classified: Secondary | ICD-10-CM | POA: Diagnosis not present

## 2019-02-04 DIAGNOSIS — R600 Localized edema: Secondary | ICD-10-CM

## 2019-02-04 NOTE — Therapy (Signed)
[place], [place], [address] Phone: [phone]   Fax:  [phone]  Physical Therapy Treatment  Patient Details  Name: Tina Ware MRN: [id number] Date of Birth: 20-Jun-1940 Referring Provider (PT): Joni Fears, MD   Encounter Date: 02/04/2019  PT End of Session - 02/04/19 1356    Visit Number  6    Number of Visits  7    Authorization Type  [employer]    PT Start Time  1330    PT Stop Time  1413    PT Time Calculation (min)  43 min    Activity Tolerance  Patient tolerated treatment well    Behavior During Therapy  [facility] for tasks assessed/performed       Past Medical History:  Diagnosis Date  . Arthritis    RA  . CAD (coronary artery disease)   . Fibroid, uterine   . Hypertension     Past Surgical History:  Procedure Laterality Date  . CARPAL TUNNEL RELEASE Right 12/20/2018  . CATARACT EXTRACTION, BILATERAL    . CHOLECYSTECTOMY    . TRIGGER FINGER RELEASE Right 12/20/2018    There were no vitals filed for this visit.  Subjective Assessment - 02/04/19 1335    Subjective  Patient reports dshe has been able to write. The swelling went down over the weekend.    Currently in Pain?  No/denies                       [facility] Adult PT Treatment/Exercise - 02/04/19 0001      Hand Exercises   Opposition Limitations  able to get to her poinkiie finger     Other Hand Exercises  DIP flexion x20; lumbrical grip x20;       Wrist Exercises   Wrist Flexion Limitations  x20 1 lb     Wrist Extension Limitations  x20 1 lb     Other wrist exercises  elbown pronation supination 2x10; elbow flexion and extension 2x10 ; scap retraction x20 yellow     Other wrist exercises  bicpes curl 2x10 2lb       Manual Therapy   Manual Therapy  Edema management    Manual therapy comments  sacap mobilization to trigger finger.     Edema Management  right hand edema massgae. Therapy careful not to  stretch patients wound as it is only partially healed     Passive ROM  IPs and wrist; focused on middle finger extension where trigger point release was done             PT Education - 02/04/19 1355    Education Details  reviewed scar tissue massage to trigger finger    Person(s) Educated  Patient    Methods  Explanation;Demonstration;Tactile cues;Verbal cues    Comprehension  Verbalized understanding;Returned demonstration;Tactile cues required;Verbal cues required          PT Long Term Goals - 01/31/19 0757      PT LONG TERM GOAL #1   Title  Pt will be able to make a full fist    Baseline  approximiately 25% today    Time  4    Period  Weeks    Status  On-going      PT LONG TERM GOAL #2   Title  wrist flx & ext to at least 50 deg    Baseline  wrist ext 38, and flexion 46    Time  4    Period  Weeks    Status  On-going      PT LONG TERM GOAL #3   Title  Pt will be able to use dominant hand for daily activities- work and self care    Baseline  unable to use functionally    Time  4    Period  Weeks    Status  On-going      PT LONG TERM GOAL #4   Title  pt will be able to lift and carry light objects with Right hand    Baseline  unable to lift items with RT hand  trying to use computer mouse but unable    Time  4    Period  Weeks    Status  On-going            Plan - 02/04/19 1359    Clinical Impression Statement  Patient is making good progress. Her incisions have healed well. She tolerated ther-=ex well. Therapy reviewed self trigger finger stretching at home. Therapy also added pulling exercises. She had better fine motor coordination. Overall the patient is making good progress. She will schedule 1x a week for 2-3 more weeks. Therapy will perfrom a progress note next visit. Therapy also advanced her weights with wrist and elbow exercises.    Personal Factors and Comorbidities  Comorbidity 1    Comorbidities  persistent edema in other body parts, HTN, RA     Examination-Activity Limitations  Bathing;Bed Mobility;Self Feeding;Carry;Sleep;Dressing;Hygiene/Grooming;Lift    Examination-Participation Restrictions  Meal Prep;Cleaning;Community Activity    Stability/Clinical Decision Making  Stable/Uncomplicated       Patient will benefit from skilled therapeutic intervention in order to improve the following deficits and impairments:  Decreased range of motion, Impaired UE functional use, Decreased activity tolerance, Impaired flexibility, Decreased strength  Visit Diagnosis: Stiffness of right hand, not elsewhere classified  Localized edema     Problem List Patient Active Problem List   Diagnosis Date Noted  . Status post trigger finger release 01/24/2019  . Carpal tunnel syndrome, right upper limb 12/27/2018  . Localized edema 02/12/2018  . Essential hypertension 02/12/2018  . Chronic pain of right knee 02/12/2018  . Abdominal pain 05/03/2017    Carney Living PT DPT  02/04/2019, 3:30 PM  [facility] [address] [place], [place], [address] Phone: [phone]   Fax:  [phone]  Name: Tina Ware MRN: [id number] Date of Birth: 1940/04/10

## 2019-02-05 ENCOUNTER — Ambulatory Visit: Payer: Medicare Other

## 2019-02-05 ENCOUNTER — Other Ambulatory Visit: Payer: Self-pay | Admitting: Internal Medicine

## 2019-02-05 VITALS — BP 180/110 | HR 90 | Temp 98.3°F | Ht 62.4 in | Wt 166.4 lb

## 2019-02-05 DIAGNOSIS — I10 Essential (primary) hypertension: Secondary | ICD-10-CM

## 2019-02-05 NOTE — Progress Notes (Signed)
Pt was seen today for elevated bp. Pt does not feel bad. Pt was advised to increase losartan to two tablets daily also increase her lasix to 40mg  daily. Pt is understanding and has been scheduled for a follow up in two days.

## 2019-02-06 ENCOUNTER — Ambulatory Visit: Payer: Medicare Other | Admitting: Physical Therapy

## 2019-02-06 ENCOUNTER — Telehealth: Payer: Self-pay | Admitting: Physical Therapy

## 2019-02-06 ENCOUNTER — Encounter: Payer: Self-pay | Admitting: Physical Therapy

## 2019-02-06 NOTE — Telephone Encounter (Signed)
Spoke with patient regarding missed visit. Patient reports she forgot. She has a follow up with MD tomorrow. Therapy will see her next week.

## 2019-02-07 ENCOUNTER — Encounter: Payer: Self-pay | Admitting: Orthopaedic Surgery

## 2019-02-07 ENCOUNTER — Ambulatory Visit (INDEPENDENT_AMBULATORY_CARE_PROVIDER_SITE_OTHER): Payer: Medicare Other | Admitting: Orthopaedic Surgery

## 2019-02-07 ENCOUNTER — Encounter: Payer: Self-pay | Admitting: Internal Medicine

## 2019-02-07 ENCOUNTER — Ambulatory Visit: Payer: Medicare Other | Admitting: Internal Medicine

## 2019-02-07 ENCOUNTER — Other Ambulatory Visit: Payer: Self-pay

## 2019-02-07 VITALS — Ht 62.0 in | Wt 162.0 lb

## 2019-02-07 VITALS — BP 132/90 | HR 75 | Temp 98.6°F | Ht 63.0 in | Wt 162.0 lb

## 2019-02-07 DIAGNOSIS — R600 Localized edema: Secondary | ICD-10-CM | POA: Diagnosis not present

## 2019-02-07 DIAGNOSIS — Z9889 Other specified postprocedural states: Secondary | ICD-10-CM

## 2019-02-07 DIAGNOSIS — I10 Essential (primary) hypertension: Secondary | ICD-10-CM | POA: Diagnosis not present

## 2019-02-07 DIAGNOSIS — G5601 Carpal tunnel syndrome, right upper limb: Secondary | ICD-10-CM

## 2019-02-07 MED ORDER — LOSARTAN POTASSIUM 100 MG PO TABS: 100.0000 mg | ORAL_TABLET | Freq: Every day | ORAL | 1 refills | Status: DC

## 2019-02-07 NOTE — Progress Notes (Signed)
Office Visit Note   Patient: Tina Ware           Date of Birth: 07-Jun-1940           MRN: [id number] Visit Date: 02/07/2019              Requested by: Glendale Chard, [address] [place],  [place] [address] PCP: Glendale Chard, MD   Assessment & Plan: Visit Diagnoses:  1. Carpal tunnel syndrome, right upper limb   2. Status post trigger finger release     Plan: Approximately 6 weeks status post right carpal tunnel release and long finger trigger finger release.  Developed considerable stiffness and swelling postoperatively and has been going to therapy.  Feeling much better.  Of note is that she has arthritic changes in her opposite hand and cannot make a full fist.  She is definitely feeling better and not having nearly as much pain.  Continue with therapy and will check her back in a month.  She is comfortable and happy with the results of her surgery.  Follow-Up Instructions: Return in about 4 weeks (around 03/07/2019).   Orders:  No orders of the defined types were placed in this encounter.  No orders of the defined types were placed in this encounter.     Procedures: No procedures performed   Clinical Data: No additional findings.   Subjective: Chief Complaint  Patient presents with  . Right Hand - Routine Post Op    Right carpal tunnel release and middle trigger finger release DOS 12/20/2018  Patient presents today for a two week follow up on her right hand. She had a right carpal tunnel release and middle trigger finger release on 12/20/2018. She is currently in therapy and going twice weekly. Her swelling has improved. She is not taking anything for pain, due to no pain.  No longer having active triggering of the long finger.  Having much better for sensation in the radial 3 digits of her hand.  HPI  Review of Systems  Constitutional: Positive for fatigue.  HENT: Negative for ear pain.   Eyes: Negative for pain.  Respiratory: Negative  for shortness of breath.   Cardiovascular: Negative for leg swelling.  Gastrointestinal: Negative for constipation and diarrhea.  Endocrine: Negative for cold intolerance and heat intolerance.  Genitourinary: Negative for difficulty urinating.  Musculoskeletal: Positive for joint swelling.  Skin: Negative for rash.  Allergic/Immunologic: Negative for food allergies.  Neurological: Negative for weakness.  Hematological: Does not bruise/bleed easily.  Psychiatric/Behavioral: Negative for sleep disturbance.     Objective: Vital Signs: Ht 5\' 2"  (1.575 m)   Wt 162 lb (73.5 kg)   BMI 29.63 kg/m   Physical Exam  Ortho Exam still has some residual swelling of all of her digits and still lacks about 1-1/2 to 2 fingerbreadths from touching the tips of her fingers and palm of the hand but similar to her left hand.  Normal sensibility.  Both hands are cool but no obvious evidence of reflex sympathetic dystrophy.  Good opposition of thumb to little finger.  Good capillary refill to the tips of her fingers.  Wounds of healed nicely with some mild induration.  She will use either med derma or vitamin E for wound massage  Specialty Comments:  No specialty comments available.  Imaging: No results found.   PMFS History: Patient Active Problem List   Diagnosis Date Noted  . Status post trigger finger release 01/24/2019  . Carpal tunnel  syndrome, right upper limb 12/27/2018  . Localized edema 02/12/2018  . Essential hypertension 02/12/2018  . Chronic pain of right knee 02/12/2018  . Abdominal pain 05/03/2017   Past Medical History:  Diagnosis Date  . Arthritis    RA  . CAD (coronary artery disease)   . Fibroid, uterine   . Hypertension     Family History  Problem Relation Age of Onset  . Heart disease Mother   . Dementia Mother   . Parkinson's disease Father   . Pancreatic cancer Sister     Past Surgical History:  Procedure Laterality Date  . CARPAL TUNNEL RELEASE Right  12/20/2018  . CATARACT EXTRACTION, BILATERAL    . CHOLECYSTECTOMY    . TRIGGER FINGER RELEASE Right 12/20/2018   Social History   Occupational History  . Not on file  Tobacco Use  . Smoking status: Never Smoker  . Smokeless tobacco: Never Used  Substance and Sexual Activity  . Alcohol use: No    Alcohol/week: 0.0 standard drinks  . Drug use: No  . Sexual activity: Not Currently

## 2019-02-07 NOTE — Patient Instructions (Addendum)
Continue with losartan, 50mg  - two tablets daily until you run out New prescription for losartan 100mg  once daily will be sent to the pharmacy  Take TWO lasix (furosemide) MWF only One tablet on other days    Hypertension, Adult Hypertension is another name for high blood pressure. High blood pressure forces your heart to work harder to pump blood. This can cause problems over time. There are two numbers in a blood pressure reading. There is a top number (systolic) over a bottom number (diastolic). It is best to have a blood pressure that is below 120/80. Healthy choices can help lower your blood pressure, or you may need medicine to help lower it. What are the causes? The cause of this condition is not known. Some conditions may be related to high blood pressure. What increases the risk?  Smoking.  Having type 2 diabetes mellitus, high cholesterol, or both.  Not getting enough exercise or physical activity.  Being overweight.  Having too much fat, sugar, calories, or salt (sodium) in your diet.  Drinking too much alcohol.  Having long-term (chronic) kidney disease.  Having a family history of high blood pressure.  Age. Risk increases with age.  Race. You may be at higher risk if you are African American.  Gender. Men are at higher risk than women before age 26. After age 22, women are at higher risk than men.  Having obstructive sleep apnea.  Stress. What are the signs or symptoms?  High blood pressure may not cause symptoms. Very high blood pressure (hypertensive crisis) may cause: ? Headache. ? Feelings of worry or nervousness (anxiety). ? Shortness of breath. ? Nosebleed. ? A feeling of being sick to your stomach (nausea). ? Throwing up (vomiting). ? Changes in how you see. ? Very bad chest pain. ? Seizures. How is this treated?  This condition is treated by making healthy lifestyle changes, such as: ? Eating healthy foods. ? Exercising more. ? Drinking  less alcohol.  Your health care provider may prescribe medicine if lifestyle changes are not enough to get your blood pressure under control, and if: ? Your top number is above 130. ? Your bottom number is above 80.  Your personal target blood pressure may vary. Follow these instructions at home: Eating and drinking   If told, follow the DASH eating plan. To follow this plan: ? Fill one half of your plate at each meal with fruits and vegetables. ? Fill one fourth of your plate at each meal with whole grains. Whole grains include whole-wheat pasta, brown rice, and whole-grain bread. ? Eat or drink low-fat dairy products, such as skim milk or low-fat yogurt. ? Fill one fourth of your plate at each meal with low-fat (lean) proteins. Low-fat proteins include fish, chicken without skin, eggs, beans, and tofu. ? Avoid fatty meat, cured and processed meat, or chicken with skin. ? Avoid pre-made or processed food.  Eat less than 1,500 mg of salt each day.  Do not drink alcohol if: ? Your doctor tells you not to drink. ? You are pregnant, may be pregnant, or are planning to become pregnant.  If you drink alcohol: ? Limit how much you use to:  0-1 drink a day for women.  0-2 drinks a day for men. ? Be aware of how much alcohol is in your drink. In the [place] one drink equals one 12 oz bottle of beer (355 mL), one 5 oz glass of wine (148 mL), or one 1 oz glass of  hard liquor (44 mL). Lifestyle   Work with your doctor to stay at a healthy weight or to lose weight. Ask your doctor what the best weight is for you.  Get at least 30 minutes of exercise most days of the week. This may include walking, swimming, or biking.  Get at least 30 minutes of exercise that strengthens your muscles (resistance exercise) at least 3 days a week. This may include lifting weights or doing Pilates.  Do not use any products that contain nicotine or tobacco, such as cigarettes, e-cigarettes, and chewing  tobacco. If you need help quitting, ask your doctor.  Check your blood pressure at home as told by your doctor.  Keep all follow-up visits as told by your doctor. This is important. Medicines  Take over-the-counter and prescription medicines only as told by your doctor. Follow directions carefully.  Do not skip doses of blood pressure medicine. The medicine does not work as well if you skip doses. Skipping doses also puts you at risk for problems.  Ask your doctor about side effects or reactions to medicines that you should watch for. Contact a doctor if you:  Think you are having a reaction to the medicine you are taking.  Have headaches that keep coming back (recurring).  Feel dizzy.  Have swelling in your ankles.  Have trouble with your vision. Get help right away if you:  Get a very bad headache.  Start to feel mixed up (confused).  Feel weak or numb.  Feel faint.  Have very bad pain in your: ? Chest. ? Belly (abdomen).  Throw up more than once.  Have trouble breathing. Summary  Hypertension is another name for high blood pressure.  High blood pressure forces your heart to work harder to pump blood.  For most people, a normal blood pressure is less than 120/80.  Making healthy choices can help lower blood pressure. If your blood pressure does not get lower with healthy choices, you may need to take medicine. This information is not intended to replace advice given to you by your health care provider. Make sure you discuss any questions you have with your health care provider. Document Released: 08/31/2007 Document Revised: 11/22/2017 Document Reviewed: 11/22/2017 Elsevier Patient Education  2020 [employer].

## 2019-02-10 NOTE — Progress Notes (Signed)
Subjective:     [name] , female    DOB: May 20, 1940 , 78 y.o.   MRN: [id number]   Chief Complaint  Patient presents with  . Hypertension    HPI  She is here today for bp check. She was sent here Tuesday by GI for evaluation of markedly elevated bp. At that time, losartan was increased to 2 tabs daily and lasix was increased to 2 tabs daily. She has tolerated this regimen without any adverse effects.  Hypertension This is a chronic problem. The current episode started more than 1 year ago. The problem has been gradually improving since onset. The problem is uncontrolled. Pertinent negatives include no blurred vision, chest pain, palpitations or shortness of breath. Past treatments include diuretics and angiotensin blockers. The current treatment provides moderate improvement. Compliance problems include exercise.      Past Medical History:  Diagnosis Date  . Arthritis    RA  . CAD (coronary artery disease)   . Fibroid, uterine   . Hypertension      Family History  Problem Relation Age of Onset  . Heart disease Mother   . Dementia Mother   . Parkinson's disease Father   . Pancreatic cancer Sister      Current Outpatient Medications:  .  aspirin EC 81 MG tablet, Take 81 mg by mouth daily., Disp: , Rfl:  .  benzonatate (TESSALON) 100 MG capsule, TAKE ONE CAPSULE BY MOUTH 3 TIMES A DAY AS NEEDED FOR COUGH, Disp: 30 capsule, Rfl: 1 .  carvedilol (COREG) 6.25 MG tablet, TAKE 1 TABLET BY MOUTH TWICE A DAY WITH FOOD, Disp: 180 tablet, Rfl: 0 .  cholecalciferol (VITAMIN D) 1000 units tablet, Take 1,000 Units by mouth daily., Disp: , Rfl:  .  furosemide (LASIX) 20 MG tablet, Take 1 tablet (20 mg total) by mouth daily. (Patient taking differently: Take 40 mg by mouth daily. ), Disp: 90 tablet, Rfl: 0 .  HYDROcodone-acetaminophen (NORCO/VICODIN) 5-325 MG tablet, Take 1 tablet by mouth every 6 (six) hours as needed for moderate pain., Disp: 30 tablet, Rfl: 0 .   hydrocortisone (ANUSOL-HC) 25 MG suppository, Place 1 suppository (25 mg total) rectally 2 (two) times daily as needed for hemorrhoids or anal itching., Disp: 60 suppository, Rfl: 0 .  mometasone (NASONEX) 50 MCG/ACT nasal spray, Place 2 sprays into the nose daily as needed (allergies)., Disp: 17 g, Rfl: 1 .  Multiple Vitamin (MULTIVITAMIN WITH MINERALS) TABS tablet, Take 1 tablet by mouth daily., Disp: , Rfl:  .  Pramoxine-HC (HYDROCORTISONE ACE-PRAMOXINE) 2.5-1 % CREA, Apply 1 Syringe topically 3 (three) times daily as needed., Disp: 1 Tube, Rfl: 3 .  losartan (COZAAR) 100 MG tablet, Take 1 tablet (100 mg total) by mouth daily., Disp: 90 tablet, Rfl: 1 .  Magnesium 250 MG TABS, Take 1 tablet by mouth with evening meal (Patient not taking: Reported on 02/07/2019), Disp: 30 tablet, Rfl: 0 .  Omega-3 1000 MG CAPS, Take 1,000 mg by mouth daily., Disp: , Rfl:  .  traMADol (ULTRAM) 50 MG tablet, Take 1 tablet (50 mg total) by mouth at bedtime as needed for severe pain. (Patient not taking: Reported on 02/07/2019), Disp: 30 tablet, Rfl: 0   No Known Allergies   Review of Systems  Constitutional: Negative.  Negative for fever.  Eyes: Negative for blurred vision.  Respiratory: Negative.  Negative for shortness of breath.   Cardiovascular: Negative.  Negative for chest pain and palpitations.  Gastrointestinal: Negative.  Neurological: Negative.   Psychiatric/Behavioral: Negative.      Today's Vitals   02/07/19 1603  BP: 132/90  Pulse: 75  Temp: 98.6 F (37 C)  TempSrc: Oral  Weight: 162 lb (73.5 kg)  Height: 5\' 3"  (1.6 m)  PainSc: 0-No pain   Body mass index is 28.7 kg/m.   Objective:  Physical Exam Vitals signs and nursing note reviewed.  Constitutional:      Appearance: Normal appearance.  HENT:     Head: Normocephalic and atraumatic.  Cardiovascular:     Rate and Rhythm: Normal rate and regular rhythm.     Heart sounds: Normal heart sounds.  Pulmonary:     Effort: Pulmonary  effort is normal.     Breath sounds: Normal breath sounds.  Musculoskeletal:     Right lower leg: 2+ Pitting Edema present.     Left lower leg: 2+ Pitting Edema present.  Skin:    General: Skin is warm.  Neurological:     General: No focal deficit present.     Mental Status: She is alert.  Psychiatric:        Mood and Affect: Mood normal.        Behavior: Behavior normal.         Assessment And Plan:     1. Essential hypertension, benign  Chronic, improved control. Not yet at goal. She will continue with current meds. A rx losartan 100mg  will be sent to her pharmacy. She will rto in 2 weeks for bmp. Importance of dietary compliance was discussed with the patient.   2. Bilateral lower extremity edema  Improved. She is advised to wear compression hose and to elevate her legs when seated.   [name], MD    THE PATIENT IS ENCOURAGED TO PRACTICE SOCIAL DISTANCING DUE TO THE COVID-19 PANDEMIC.

## 2019-02-12 ENCOUNTER — Other Ambulatory Visit: Payer: Self-pay

## 2019-02-15 ENCOUNTER — Encounter

## 2019-02-18 ENCOUNTER — Other Ambulatory Visit: Payer: Self-pay

## 2019-02-18 ENCOUNTER — Ambulatory Visit: Payer: Medicare Other

## 2019-02-18 DIAGNOSIS — M25641 Stiffness of right hand, not elsewhere classified: Secondary | ICD-10-CM

## 2019-02-18 DIAGNOSIS — R600 Localized edema: Secondary | ICD-10-CM

## 2019-02-18 NOTE — Therapy (Signed)
[place], [place], [address] Phone: [phone]   Fax:  [phone]  Physical Therapy Treatment  Patient Details  Name: MAKENZEE CHOUDHRY MRN: [id number] Date of Birth: Jul 29, 1940 Referring Provider (PT): Joni Fears, MD   Encounter Date: 02/18/2019  PT End of Session - 02/18/19 1341    Visit Number  7    Number of Visits  7    Date for PT Re-Evaluation  02/08/19    Authorization Type  UHC    PT Start Time  0145    PT Stop Time  0230    PT Time Calculation (min)  45 min    Activity Tolerance  Patient tolerated treatment well    Behavior During Therapy  [facility] for tasks assessed/performed       Past Medical History:  Diagnosis Date  . Arthritis    RA  . CAD (coronary artery disease)   . Fibroid, uterine   . Hypertension     Past Surgical History:  Procedure Laterality Date  . CARPAL TUNNEL RELEASE Right 12/20/2018  . CATARACT EXTRACTION, BILATERAL    . CHOLECYSTECTOMY    . TRIGGER FINGER RELEASE Right 12/20/2018    There were no vitals filed for this visit.  Subjective Assessment - 02/18/19 1343    Subjective  MD said she is doing well.and she reports ahdn is swollen. but can move fingers better.   She elevtes and puts arthritic ointment on it.   She has a [employer] and will see next motnh    Currently in Pain?  No/denies    Pain Location  Finger (Comment which one)   tips   Pain Orientation  Right    Pain Descriptors / Indicators  Numbness         [facility] PT Assessment - 02/18/19 0001      Assessment   Medical Diagnosis  Rt carpal tunnel syndrome    Referring Provider (PT)  Joni Fears, MD    Hand Dominance  Right      AROM   Overall AROM Comments  thumb abduction LT 60 degrees   RT 40 degrees    Right Wrist Extension  50 Degrees    Right Wrist Flexion  60 Degrees    Right Wrist Radial Deviation  20 Degrees    Right Wrist Ulnar Deviation  25 Degrees      Strength    Right Wrist Flexion  4/5    Right Wrist Extension  4/5                   [facility] Adult PT Treatment/Exercise - 02/18/19 0001      Hand Exercises   Thumb Opposition  10 x each finger    Other Hand Exercises  DIP flexion x20; lumbrical grip x20;     Other Hand Exercises  prayer stretch for wrist extension x 10 reps 5 sec hold      Wrist Exercises   Other wrist exercises  elbown pronation supination x15; elbow flexion and extension x10 ; scap retraction x15 red     Other wrist exercises  bicpes curl x20 3lb       Manual Therapy   Passive ROM  IPs and wrist; focused on middle finger extension where trigger finger release was done and general joint mobs to entire hand         Lifting and carry 5# and 10# weight          PT  Long Term Goals - 02/18/19 1348      PT LONG TERM GOAL #1   Title  Pt will be able to make a full fist    Baseline  grossly finger tips about 4 cm from palm    Status  On-going      PT LONG TERM GOAL #2   Title  wrist flx & ext to at least 50 deg    Baseline  flexion 60 degrees,  ext 50 degrees    Status  Achieved      PT LONG TERM GOAL #3   Title  Pt will be able to use dominant hand for daily activities- work and self care    Baseline  uses RT hand 40% or the time    Status  Partially Met      PT LONG TERM GOAL #4   Title  pt will be able to lift and carry light objects with Right hand    Baseline  able to lift/carry up to 10 pounds but not able to lift > 5 pounds to shoulder height    Time  4    Status  Partially Met            Plan - 02/18/19 1341    Clinical Impression Statement  she appears to be progressing well but continues with stiffness and swelling limting use of RT hand. It appears RT middle finger pip joint stiffness limits extension.    Examination-Activity Limitations  Bathing;Bed Mobility;Self Feeding;Carry;Sleep;Dressing;Hygiene/Grooming;Lift    PT Frequency  2x / week    PT Duration  3 weeks    PT  Treatment/Interventions  ADLs/Self Care Home Management;Cryotherapy;Functional mobility training;Therapeutic activities;Therapeutic exercise;Patient/family education;Neuromuscular re-education;Manual techniques;Scar mobilization;Passive range of motion;Taping    PT Next Visit Plan  continue edema management, gross hand motion  add to HEP    PT Home Exercise Plan  edema mobilization, PROM    Consulted and Agree with Plan of Care  Patient       Patient will benefit from skilled therapeutic intervention in order to improve the following deficits and impairments:  Decreased range of motion, Impaired UE functional use, Decreased activity tolerance, Impaired flexibility, Decreased strength  Visit Diagnosis: Stiffness of right hand, not elsewhere classified  Localized edema     Problem List Patient Active Problem List   Diagnosis Date Noted  . Status post trigger finger release 01/24/2019  . Carpal tunnel syndrome, right upper limb 12/27/2018  . Localized edema 02/12/2018  . Essential hypertension 02/12/2018  . Chronic pain of right knee 02/12/2018  . Abdominal pain 05/03/2017    [name]  PT 02/18/2019, 2:36 PM  [facility] [address] [place], [place], [address] Phone: [phone]   Fax:  [phone]  Name: ATHENE SCHUHMACHER MRN: [id number] Date of Birth: [birth year]

## 2019-02-19 ENCOUNTER — Ambulatory Visit: Payer: Medicare Other

## 2019-02-19 DIAGNOSIS — M25641 Stiffness of right hand, not elsewhere classified: Secondary | ICD-10-CM

## 2019-02-19 DIAGNOSIS — R600 Localized edema: Secondary | ICD-10-CM

## 2019-02-19 NOTE — Therapy (Signed)
[place], [place], [address] Phone: [phone]   Fax:  [phone]  Physical Therapy Treatment  Patient Details  Name: Tina Ware MRN: [id number] Date of Birth: 05-Aug-1940 Referring Provider (PT): Joni Fears, MD   Encounter Date: 02/19/2019  PT End of Session - 02/19/19 1528    Visit Number  8    Number of Visits  16    Date for PT Re-Evaluation  03/22/19    Authorization Type  UHC    PT Start Time  0245    PT Stop Time  0325    PT Time Calculation (min)  40 min    Activity Tolerance  Patient tolerated treatment well    Behavior During Therapy  [facility] for tasks assessed/performed       Past Medical History:  Diagnosis Date  . Arthritis    RA  . CAD (coronary artery disease)   . Fibroid, uterine   . Hypertension     Past Surgical History:  Procedure Laterality Date  . CARPAL TUNNEL RELEASE Right 12/20/2018  . CATARACT EXTRACTION, BILATERAL    . CHOLECYSTECTOMY    . TRIGGER FINGER RELEASE Right 12/20/2018    There were no vitals filed for this visit.  Subjective Assessment - 02/19/19 1446    Subjective  She rpeorts RT hand less swollen. than yesterday.    Currently in Pain?  No/denies         [facility] PT Assessment - 02/19/19 0001      AROM   Overall AROM Comments  making a fist finger to 3 cm from palm                   [facility] Adult PT Treatment/Exercise - 02/19/19 0001      Hand Exercises   Thumb Opposition  10 x each finger    Other Hand Exercises  DIP flexion x20; lumbrical grip x20;  finger sread x 15 reps     Other Hand Exercises  prayer stretch for wrist extension x 10 reps 5 sec hold    gripping with passive end range stretch then hold with fingers in palm Then eccentric resistance  finger flexors  then finger flexion squeezes into rolled towel x 10       Wrist Exercises   Wrist Flexion Limitations  3# x 20    Wrist Extension Limitations  3#  x20   then pronation supination 2# x 15 , then overhead press with 2# x 10 repps     Other wrist exercises  elbown pronation supination x15; elbow flexion and extension x10 ; scap retraction x15 red     Other wrist exercises  bicpes curl x20 3lb                   PT Long Term Goals - 02/18/19 1348      PT LONG TERM GOAL #1   Title  Pt will be able to make a full fist    Baseline  grossly finger tips about 4 cm from palm    Status  On-going      PT LONG TERM GOAL #2   Title  wrist flx & ext to at least 50 deg    Baseline  flexion 60 degrees,  ext 50 degrees    Status  Achieved      PT LONG TERM GOAL #3   Title  Pt will be able to use dominant hand for daily activities-  work and self care    Baseline  uses RT hand 40% or the time    Status  Partially Met      PT LONG TERM GOAL #4   Title  pt will be able to lift and carry light objects with Right hand    Baseline  able to lift/carry up to 10 pounds but not able to lift > 5 pounds to shoulder height    Time  4    Status  Partially Met            Plan - 02/19/19 1529    Clinical Impression Statement  She is progressing with eight tolerance and grip ROM able to get .5 cm form palm at end of session. Would benefit from a few weeks more of PT. Probably doesn't need middle finger splint unless of return visit to Dr Durward Fortes unless she is concerned about lack of fuil extension.    PT Treatment/Interventions  ADLs/Self Care Home Management;Cryotherapy;Functional mobility training;Therapeutic activities;Therapeutic exercise;Patient/family education;Neuromuscular re-education;Manual techniques;Scar mobilization;Passive range of motion;Taping    PT Next Visit Plan  continue edema management, gross hand motion  add to HEP    PT Home Exercise Plan  edema mobilization, PROM    Consulted and Agree with Plan of Care  Patient       Patient will benefit from skilled therapeutic intervention in order to improve the following deficits  and impairments:  Decreased range of motion, Impaired UE functional use, Decreased activity tolerance, Impaired flexibility, Decreased strength  Visit Diagnosis: Stiffness of right hand, not elsewhere classified  Localized edema     Problem List Patient Active Problem List   Diagnosis Date Noted  . Status post trigger finger release 01/24/2019  . Carpal tunnel syndrome, right upper limb 12/27/2018  . Localized edema 02/12/2018  . Essential hypertension 02/12/2018  . Chronic pain of right knee 02/12/2018  . Abdominal pain 05/03/2017    Tina Ware  PT 02/19/2019, 5:23 PM  [facility] [address] [place], [place], [address] Phone: [phone]   Fax:  [phone]  Name: Tina Ware MRN: [id number] Date of Birth: [birth year]

## 2019-02-25 ENCOUNTER — Other Ambulatory Visit: Payer: Self-pay

## 2019-02-25 ENCOUNTER — Ambulatory Visit: Payer: Medicare Other | Admitting: Physical Therapy

## 2019-02-25 DIAGNOSIS — M25641 Stiffness of right hand, not elsewhere classified: Secondary | ICD-10-CM | POA: Diagnosis not present

## 2019-02-25 DIAGNOSIS — R600 Localized edema: Secondary | ICD-10-CM

## 2019-02-25 NOTE — Therapy (Signed)
[place], [place], [address] Phone: [phone]   Fax:  [phone]  Physical Therapy Treatment  Patient Details  Name: Tina Ware MRN: [id number] Date of Birth: 02-20-1941 Referring Provider (PT): Joni Fears, MD   Encounter Date: 02/25/2019  PT End of Session - 02/25/19 1337    Visit Number  9    Number of Visits  16    Date for PT Re-Evaluation  03/22/19    Authorization Type  UHC    PT Start Time  1330    PT Stop Time  1413    PT Time Calculation (min)  43 min    Activity Tolerance  Patient tolerated treatment well    Behavior During Therapy  [facility] for tasks assessed/performed       Past Medical History:  Diagnosis Date  . Arthritis    RA  . CAD (coronary artery disease)   . Fibroid, uterine   . Hypertension     Past Surgical History:  Procedure Laterality Date  . CARPAL TUNNEL RELEASE Right 12/20/2018  . CATARACT EXTRACTION, BILATERAL    . CHOLECYSTECTOMY    . TRIGGER FINGER RELEASE Right 12/20/2018    There were no vitals filed for this visit.  Subjective Assessment - 02/25/19 1544    Subjective  Patient reprotsher hand is swollen but she feels likeher fucntion is improving.    Patient Stated Goals  use dominant hand    Currently in Pain?  No/denies                       [facility] Adult PT Treatment/Exercise - 02/25/19 0001      Hand Exercises   Thumb Opposition  10 x each finger    Other Hand Exercises  DIP flexion x20; lumbrical grip x20;  finger sread x 15 reps     Other Hand Exercises  prayer stretch for wrist extension x 10 reps 5 sec hold    gripping with passive end range stretch then hold with fingers in palm Then eccentric resistance  finger flexors  then finger flexion squeezes into rolled towel x 10       Wrist Exercises   Wrist Flexion Limitations  3# x20     Wrist Extension Limitations  3#x20     Other wrist exercises  elbown pronation  supination x20 3#; elbow flexion and extension x10 ; scap retraction x15 red ; cap retraction x20 red; shoulder extension x20 red; shoulder IR x20 red ; shoulder er x20 red; 3 laps with hald velcro pronation supination 1 lap 1/2 velcro keyt grip;     Other wrist exercises  bicpes curl x20 3lb       Manual Therapy   Edema Management  right hand edema massgae. Therapy careful not to stretch patients wound as it is only partially healed     Passive ROM  IPs and wrist; focused on middle finger extension where trigger point release was done             PT Education - 02/25/19 1544    Education Details  HEP and symptom management    Person(s) Educated  Patient    Methods  Explanation;Demonstration;Tactile cues;Verbal cues    Comprehension  Verbalized understanding;Returned demonstration;Tactile cues required;Verbal cues required          PT Long Term Goals - 02/18/19 1348      PT LONG TERM GOAL #1   Title  Pt  will be able to make a full fist    Baseline  grossly finger tips about 4 cm from palm    Status  On-going      PT LONG TERM GOAL #2   Title  wrist flx & ext to at least 50 deg    Baseline  flexion 60 degrees,  ext 50 degrees    Status  Achieved      PT LONG TERM GOAL #3   Title  Pt will be able to use dominant hand for daily activities- work and self care    Baseline  uses RT hand 40% or the time    Status  Partially Met      PT LONG TERM GOAL #4   Title  pt will be able to lift and carry light objects with Right hand    Baseline  able to lift/carry up to 10 pounds but not able to lift > 5 pounds to shoulder height    Time  4    Status  Partially Met            Plan - 02/25/19 1550    Clinical Impression Statement  Patient continues to make progress. She did better with the velcro board then she did a few weeks ago. Therapy added pulling exercises with no increase in pain. She will continue with edema management at home. Her left LE was swollen as well. she  sees her MD on Wednesday.    Personal Factors and Comorbidities  Comorbidity 1    Comorbidities  persistent edema in other body parts, HTN, RA    Examination-Participation Restrictions  Meal Prep;Cleaning;Community Activity    Stability/Clinical Decision Making  Stable/Uncomplicated    Clinical Decision Making  Low    Rehab Potential  Good    PT Frequency  2x / week    PT Duration  3 weeks    PT Treatment/Interventions  ADLs/Self Care Home Management;Cryotherapy;Functional mobility training;Therapeutic activities;Therapeutic exercise;Patient/family education;Neuromuscular re-education;Manual techniques;Scar mobilization;Passive range of motion;Taping    PT Next Visit Plan  continue edema management, gross hand motion  add to HEP    PT Home Exercise Plan  edema mobilization, PROM    Consulted and Agree with Plan of Care  Patient       Patient will benefit from skilled therapeutic intervention in order to improve the following deficits and impairments:  Decreased range of motion, Impaired UE functional use, Decreased activity tolerance, Impaired flexibility, Decreased strength  Visit Diagnosis: Stiffness of right hand, not elsewhere classified  Localized edema     Problem List Patient Active Problem List   Diagnosis Date Noted  . Status post trigger finger release 01/24/2019  . Carpal tunnel syndrome, right upper limb 12/27/2018  . Localized edema 02/12/2018  . Essential hypertension 02/12/2018  . Chronic pain of right knee 02/12/2018  . Abdominal pain 05/03/2017    Carney Living PT DPT  02/25/2019, 3:53 PM  [facility] [address] [place], [place], [address] Phone: [phone]   Fax:  [phone]  Name: Tina Ware MRN: [id number] Date of Birth: [birth year]

## 2019-02-27 ENCOUNTER — Telehealth: Payer: Self-pay

## 2019-02-27 ENCOUNTER — Ambulatory Visit: Payer: Medicare Other | Admitting: Internal Medicine

## 2019-02-27 ENCOUNTER — Ambulatory Visit: Payer: Medicare Other | Attending: Orthopaedic Surgery | Admitting: Physical Therapy

## 2019-02-27 ENCOUNTER — Encounter: Payer: Self-pay | Admitting: Internal Medicine

## 2019-02-27 ENCOUNTER — Other Ambulatory Visit: Payer: Self-pay

## 2019-02-27 VITALS — BP 130/96 | HR 86 | Temp 98.1°F | Ht 63.0 in | Wt 163.0 lb

## 2019-02-27 DIAGNOSIS — R600 Localized edema: Secondary | ICD-10-CM

## 2019-02-27 DIAGNOSIS — M25641 Stiffness of right hand, not elsewhere classified: Secondary | ICD-10-CM | POA: Insufficient documentation

## 2019-02-27 DIAGNOSIS — Z6828 Body mass index (BMI) 28.0-28.9, adult: Secondary | ICD-10-CM | POA: Diagnosis not present

## 2019-02-27 DIAGNOSIS — E663 Overweight: Secondary | ICD-10-CM | POA: Diagnosis not present

## 2019-02-27 DIAGNOSIS — I10 Essential (primary) hypertension: Secondary | ICD-10-CM | POA: Diagnosis not present

## 2019-02-27 NOTE — Telephone Encounter (Signed)
I left the pt a message that the pharmacy said that the pt has only gotten the shingles vaccination and hasn't gotten the prevnar 13 and that it's time for the 2nd dose of shingles vaccination and that Dr. Baird Cancer said for the pt to get the prevnar 13 vaccination.

## 2019-02-28 ENCOUNTER — Encounter: Payer: Self-pay | Admitting: Physical Therapy

## 2019-02-28 NOTE — Therapy (Signed)
[place], [place], [address] Phone: [phone]   Fax:  [phone]  Physical Therapy Treatment/Progress  Patient Details  Name: Tina Ware MRN: [id number] Date of Birth: 05-22-1940 Referring Provider (PT): Joni Fears, MD  Progress Note Reporting Period 01/10/2019 to 02/28/2019  See note below for Objective Data and Assessment of Progress/Goals.       Encounter Date: 02/27/2019  PT End of Session - 02/28/19 1232    Visit Number  10    Number of Visits  16    Date for PT Re-Evaluation  03/22/19    Authorization Type  UHC MCR    PT Start Time  1330    PT Stop Time  1410    PT Time Calculation (min)  40 min    Activity Tolerance  Patient tolerated treatment well    Behavior During Therapy  [facility] for tasks assessed/performed       Past Medical History:  Diagnosis Date  . Arthritis    RA  . CAD (coronary artery disease)   . Fibroid, uterine   . Hypertension     Past Surgical History:  Procedure Laterality Date  . CARPAL TUNNEL RELEASE Right 12/20/2018  . CATARACT EXTRACTION, BILATERAL    . CHOLECYSTECTOMY    . TRIGGER FINGER RELEASE Right 12/20/2018    There were no vitals filed for this visit.  Subjective Assessment - 02/28/19 1229    Subjective  Patients swelling is down. She has been using her hand for all functional tasks. She reports the swelling tends to get workse when her whole right side swells. She was advised to contact her MD about her UE /LE swelling    Currently in Pain?  No/denies         [facility] PT Assessment - 02/28/19 0001      Sensation   Additional Comments  numbness has resolved       AROM   Overall AROM Comments  can make a full fist; improved active extension ogf the middle finger       Strength   Overall Strength Comments  5/5 gross right wrsit strength       Palpation   Palpation comment  Noi tenderness to palpation; scar tissue in the lower  palm and the trigger point surgical area                   [facility] Adult PT Treatment/Exercise - 02/28/19 0001      Hand Exercises   Thumb Opposition  10 x each finger    Other Hand Exercises  DIP flexion x20; lumbrical grip x20;  finger sread x 15 reps     Other Hand Exercises  prayer stretch for wrist extension x 10 reps 5 sec hold    gripping with passive end range stretch then hold with fingers in palm Then eccentric resistance  finger flexors  then finger flexion squeezes into rolled towel x 10       Wrist Exercises   Other wrist exercises  elbown pronation supination x20 3#; elbow flexion and extension x10 ; scap retraction x15 red ; cap retraction x20 red; shoulder extension x20 red; shoulder IR x20 red ; shoulder er x20 red; 3 laps with hald velcro pronation supination 1 lap 1/2 velcro keyt grip;     Other wrist exercises  bicpes curl x20 3lb       Manual Therapy   Edema Management  right hand edema massgae. Therapy  careful not to stretch patients wound as it is only partially healed     Passive ROM  IPs and wrist; focused on middle finger extension where trigger point release was done             PT Education - 02/28/19 1232    Education Details  reviewed HEP and symptom mangement    Person(s) Educated  Patient    Methods  Explanation;Verbal cues;Demonstration;Tactile cues    Comprehension  Verbalized understanding;Returned demonstration;Verbal cues required;Tactile cues required          PT Long Term Goals - 02/28/19 1238      PT LONG TERM GOAL #1   Title  Pt will be able to make a full fist    Baseline  able to get to thenar today    Time  4    Period  Weeks    Status  On-going      PT LONG TERM GOAL #2   Title  wrist flx & ext to at least 50 deg    Baseline  flexion 60 degrees,  ext 50 degrees    Time  4    Period  Weeks      PT LONG TERM GOAL #3   Title  Pt will be able to use dominant hand for daily activities- work and self care     Baseline  using it for writing and eating    Time  4    Period  Weeks    Status  On-going      PT LONG TERM GOAL #4   Title  pt will be able to lift and carry light objects with Right hand    Baseline  able to lift/carry up to 10 pounds but not able to lift > 5 pounds to shoulder height    Time  4    Period  Weeks    Status  On-going            Plan - 02/28/19 1235    Clinical Impression Statement  The patient is making great progress. she can make a full fist and is using her roight hand for all functional tasks. We have been working on pushing and pulling. Her wrist motion is WFL. She continues to have intermittent swelling. She reports when it swells she also has swelling in her leg. She feels comfortable with her HEP. She would like to come in for 1 follow up visit in 2 weeks to make sure her swelling is still under control.    Comorbidities  persistent edema in other body parts, HTN, RA    Examination-Activity Limitations  Bathing;Bed Mobility;Self Feeding;Carry;Sleep;Dressing;Hygiene/Grooming;Lift    Examination-Participation Restrictions  Meal Prep;Cleaning;Community Activity    Clinical Decision Making  Low    Rehab Potential  Good    PT Duration  3 weeks    PT Treatment/Interventions  ADLs/Self Care Home Management;Cryotherapy;Functional mobility training;Therapeutic activities;Therapeutic exercise;Patient/family education;Neuromuscular re-education;Manual techniques;Scar mobilization;Passive range of motion;Taping    PT Next Visit Plan  continue edema management, gross hand motion  add to HEP    PT Home Exercise Plan  edema mobilization, PROM    Consulted and Agree with Plan of Care  Patient       Patient will benefit from skilled therapeutic intervention in order to improve the following deficits and impairments:  Decreased range of motion, Impaired UE functional use, Decreased activity tolerance, Impaired flexibility, Decreased strength  Visit Diagnosis: Stiffness of  right hand, not elsewhere classified  Localized edema     Problem List Patient Active Problem List   Diagnosis Date Noted  . Status post trigger finger release 01/24/2019  . Carpal tunnel syndrome, right upper limb 12/27/2018  . Localized edema 02/12/2018  . Essential hypertension 02/12/2018  . Chronic pain of right knee 02/12/2018  . Abdominal pain 05/03/2017    Carney Living  PT DPT  02/28/2019, 12:40 PM  [facility] [address] [place], [place], [address] Phone: [phone]   Fax:  [phone]  Name: Tina Ware MRN: [id number] Date of Birth: [birth year]

## 2019-03-03 NOTE — Progress Notes (Signed)
This visit occurred during the SARS-CoV-2 public health emergency.  Safety protocols were in place, including screening questions prior to the visit, additional usage of staff PPE, and extensive cleaning of exam room while observing appropriate contact time as indicated for disinfecting solutions.  Subjective:     [name] , female    DOB: March 12, 1941 , 78 y.o.   MRN: [id number]   Chief Complaint  Patient presents with  . Hypertension    HPI  She is here today for bp check. Every time she sees another doctor, her bp is elevated. She denies chest pain, shortness of breath. She reports compliance with meds.     Past Medical History:  Diagnosis Date  . Arthritis    RA  . CAD (coronary artery disease)   . Fibroid, uterine   . Hypertension      Family History  Problem Relation Age of Onset  . Heart disease Mother   . Dementia Mother   . Parkinson's disease Father   . Pancreatic cancer Sister      Current Outpatient Medications:  .  aspirin EC 81 MG tablet, Take 81 mg by mouth daily., Disp: , Rfl:  .  benzonatate (TESSALON) 100 MG capsule, TAKE ONE CAPSULE BY MOUTH 3 TIMES A DAY AS NEEDED FOR COUGH, Disp: 30 capsule, Rfl: 1 .  carvedilol (COREG) 6.25 MG tablet, TAKE 1 TABLET BY MOUTH TWICE A DAY WITH FOOD, Disp: 180 tablet, Rfl: 0 .  cholecalciferol (VITAMIN D) 1000 units tablet, Take 1,000 Units by mouth daily., Disp: , Rfl:  .  furosemide (LASIX) 20 MG tablet, Take 1 tablet (20 mg total) by mouth daily. (Patient taking differently: Take 40 mg by mouth daily. Take 2 tablet on Mon, Wednes, Fri, Take 1 tablet on Tues, Thurs, Satur. Sun), Disp: 90 tablet, Rfl: 0 .  HYDROcodone-acetaminophen (NORCO/VICODIN) 5-325 MG tablet, Take 1 tablet by mouth every 6 (six) hours as needed for moderate pain., Disp: 30 tablet, Rfl: 0 .  hydrocortisone (ANUSOL-HC) 25 MG suppository, Place 1 suppository (25 mg total) rectally 2 (two) times daily as needed for hemorrhoids or anal  itching., Disp: 60 suppository, Rfl: 0 .  losartan (COZAAR) 100 MG tablet, Take 1 tablet (100 mg total) by mouth daily., Disp: 90 tablet, Rfl: 1 .  Magnesium 250 MG TABS, Take 1 tablet by mouth with evening meal (Patient taking differently: Liquid form), Disp: 30 tablet, Rfl: 0 .  mometasone (NASONEX) 50 MCG/ACT nasal spray, Place 2 sprays into the nose daily as needed (allergies)., Disp: 17 g, Rfl: 1 .  Multiple Vitamin (MULTIVITAMIN WITH MINERALS) TABS tablet, Take 1 tablet by mouth daily., Disp: , Rfl:  .  Omega-3 1000 MG CAPS, Take 1,000 mg by mouth daily., Disp: , Rfl:  .  traMADol (ULTRAM) 50 MG tablet, Take 1 tablet (50 mg total) by mouth at bedtime as needed for severe pain. (Patient not taking: Reported on 02/07/2019), Disp: 30 tablet, Rfl: 0   No Known Allergies   Review of Systems  Constitutional: Negative.   Respiratory: Negative.   Cardiovascular: Negative.   Gastrointestinal: Negative.   Neurological: Negative.   Psychiatric/Behavioral: Negative.      Today's Vitals   02/27/19 1517  BP: (!) 130/96  Pulse: 86  Temp: 98.1 F (36.7 C)  TempSrc: Oral  Weight: 163 lb (73.9 kg)  Height: '5\' 3"'  (1.6 m)   Body mass index is 28.87 kg/m.   Objective:  Physical Exam Vitals signs and nursing note  reviewed.  Constitutional:      Appearance: Normal appearance.  HENT:     Head: Normocephalic and atraumatic.  Cardiovascular:     Rate and Rhythm: Normal rate and regular rhythm.     Heart sounds: Normal heart sounds.  Pulmonary:     Effort: Pulmonary effort is normal.     Breath sounds: Normal breath sounds.  Musculoskeletal:     Right lower leg: 1+ Pitting Edema present.     Left lower leg: 1+ Pitting Edema present.  Skin:    General: Skin is warm.  Neurological:     General: No focal deficit present.     Mental Status: She is alert.  Psychiatric:        Mood and Affect: Mood normal.        Behavior: Behavior normal.         Assessment And Plan:     1.  Malignant hypertension  I will check labs as listed below. She may benefit from addition of Bidil vs. Spironolactone to her current regimen. I will make further recommendations once her labs are available for review.   - Aldosterone/Renin - BMP8+EGFR  2. Bilateral lower extremity edema  She is encouraged to follow a low-sodium diet. She is also advised to wear compression hose and to elevate her feet while seated.   3. Overweight with body mass index (BMI) of 28 to 28.9 in adult  She is encouraged to strive to lose 7 pounds to decrease cardiac risk.   [name], MD    THE PATIENT IS ENCOURAGED TO PRACTICE SOCIAL DISTANCING DUE TO THE COVID-19 PANDEMIC.

## 2019-03-06 ENCOUNTER — Other Ambulatory Visit: Payer: Self-pay

## 2019-03-06 ENCOUNTER — Encounter: Payer: Self-pay | Admitting: Orthopaedic Surgery

## 2019-03-06 ENCOUNTER — Ambulatory Visit (INDEPENDENT_AMBULATORY_CARE_PROVIDER_SITE_OTHER): Payer: Medicare Other | Admitting: Orthopaedic Surgery

## 2019-03-06 VITALS — Ht 63.0 in | Wt 163.0 lb

## 2019-03-06 DIAGNOSIS — G5601 Carpal tunnel syndrome, right upper limb: Secondary | ICD-10-CM

## 2019-03-06 NOTE — Progress Notes (Signed)
Office Visit Note   Patient: Tina Ware           Date of Birth: 03/12/1941           MRN: [id number] Visit Date: 03/06/2019              Requested by: Glendale Chard, [address] [place],  [place] [address] PCP: Glendale Chard, MD   Assessment & Plan: Visit Diagnoses:  1. Carpal tunnel syndrome, right upper limb     Plan: 10 weeks status post right carpal tunnel release and release of the right long trigger finger and doing well.  Has finished a course of physical therapy.  Still has some stiffness related to arthritis but overall much improved.  She is able to drive a car without her hand going numb and her long finger is not "catching".  Literature to work on her exercises and return as needed.  There is some induration around the carpal tunnel scar and she will continue with the cocoa butter, aloe vera or even med derma.  Follow-Up Instructions: Return if symptoms worsen or fail to improve.   Orders:  No orders of the defined types were placed in this encounter.  No orders of the defined types were placed in this encounter.     Procedures: No procedures performed   Clinical Data: No additional findings.   Subjective: Chief Complaint  Patient presents with  . Right Hand - Follow-up    Right carpal tunnel release and middle trigger finger release DOS 12/20/2018  Patient presents today for follow up on her right hand. She had right carpal tunnel release and middle trigger finger release on 12/20/2018. She is now 11 weeks out from surgery. She states that her hand is doing well. No complaints.  HPI  Review of Systems   Objective: Vital Signs: Ht 5\' 3"  (1.6 m)   Wt 163 lb (73.9 kg)   BMI 28.87 kg/m   Physical Exam  Ortho Exam right hand incisions are healing without a problem.  No catching of the long finger.  She still lacks a few degrees to full extension across the IP joint of the long finger but I think that is related to her arthritis.   A little thickening of the scar of for the carpal tunnel release but not painful.  There is no redness or erythema.  No Tinel's over the median nerve.  Good opposition of thumb little finger.  Good sensation of the tips of her fingers  Specialty Comments:  No specialty comments available.  Imaging: No results found.   PMFS History: Patient Active Problem List   Diagnosis Date Noted  . Status post trigger finger release 01/24/2019  . Carpal tunnel syndrome, right upper limb 12/27/2018  . Localized edema 02/12/2018  . Essential hypertension 02/12/2018  . Chronic pain of right knee 02/12/2018  . Abdominal pain 05/03/2017   Past Medical History:  Diagnosis Date  . Arthritis    RA  . CAD (coronary artery disease)   . Fibroid, uterine   . Hypertension     Family History  Problem Relation Age of Onset  . Heart disease Mother   . Dementia Mother   . Parkinson's disease Father   . Pancreatic cancer Sister     Past Surgical History:  Procedure Laterality Date  . CARPAL TUNNEL RELEASE Right 12/20/2018  . CATARACT EXTRACTION, BILATERAL    . CHOLECYSTECTOMY    . TRIGGER FINGER RELEASE Right 12/20/2018  Social History   Occupational History  . Not on file  Tobacco Use  . Smoking status: Never Smoker  . Smokeless tobacco: Never Used  Substance and Sexual Activity  . Alcohol use: No    Alcohol/week: 0.0 standard drinks  . Drug use: No  . Sexual activity: Not Currently

## 2019-03-07 LAB — BMP8+EGFR
BUN/Creatinine Ratio: 13 (ref 12–28)
BUN: 9 mg/dL (ref 8–27)
CO2: 25 mmol/L (ref 20–29)
Calcium: 9.4 mg/dL (ref 8.7–10.3)
Chloride: 99 mmol/L (ref 96–106)
Creatinine, Ser: 0.68 mg/dL (ref 0.57–1.00)
GFR calc Af Amer: 98 mL/min/{1.73_m2} (ref >59)
GFR calc non Af Amer: 85 mL/min/{1.73_m2} (ref >59)
Glucose: 110 mg/dL — ABNORMAL HIGH (ref 65–99)
Potassium: 4.2 mmol/L (ref 3.5–5.2)
Sodium: 133 mmol/L — ABNORMAL LOW (ref 134–144)

## 2019-03-07 LAB — ALDOSTERONE + RENIN ACTIVITY W/ RATIO
ALDOS/RENIN RATIO: >11.4 (ref 0.0–30.0)
ALDOSTERONE: 1.9 ng/dL (ref 0.0–30.0)
Renin: <0.167 ng/mL/hr — ABNORMAL LOW (ref 0.167–5.380)

## 2019-03-12 ENCOUNTER — Other Ambulatory Visit: Payer: Self-pay | Admitting: Internal Medicine

## 2019-03-12 ENCOUNTER — Ambulatory Visit: Payer: Medicare Other | Admitting: Physical Therapy

## 2019-03-12 ENCOUNTER — Other Ambulatory Visit: Payer: Self-pay

## 2019-03-12 DIAGNOSIS — M25641 Stiffness of right hand, not elsewhere classified: Secondary | ICD-10-CM | POA: Diagnosis not present

## 2019-03-12 DIAGNOSIS — R600 Localized edema: Secondary | ICD-10-CM

## 2019-03-13 ENCOUNTER — Encounter: Payer: Self-pay | Admitting: Physical Therapy

## 2019-03-13 ENCOUNTER — Ambulatory Visit: Payer: Medicare Other | Admitting: Rheumatology

## 2019-03-13 NOTE — Therapy (Signed)
[place], [place], [address] Phone: [phone]   Fax:  [phone]  Physical Therapy Treatment/Discharge   Patient Details  Name: ZOEE HEENEY MRN: [id number] Date of Birth: 1941/02/25 Referring Provider (PT): Joni Fears, MD   Encounter Date: 03/12/2019  PT End of Session - 03/13/19 1129    Visit Number  11    Number of Visits  16    Date for PT Re-Evaluation  03/22/19    Authorization Type  [employer] MCR    PT Start Time  1330    PT Stop Time  1400   short discharge visit   PT Time Calculation (min)  30 min    Activity Tolerance  Patient tolerated treatment well    Behavior During Therapy  [facility] for tasks assessed/performed       Past Medical History:  Diagnosis Date  . Arthritis    RA  . CAD (coronary artery disease)   . Fibroid, uterine   . Hypertension     Past Surgical History:  Procedure Laterality Date  . CARPAL TUNNEL RELEASE Right 12/20/2018  . CATARACT EXTRACTION, BILATERAL    . CHOLECYSTECTOMY    . TRIGGER FINGER RELEASE Right 12/20/2018    There were no vitals filed for this visit.  Subjective Assessment - 03/13/19 1118    Subjective  Patient has taken 2 weeks and worked on her exercises. She continues to have some swelling but it comes and goes. She feels like she is ready for discharge.    Patient Stated Goals  use dominant hand    Currently in Pain?  No/denies                       [facility] Adult PT Treatment/Exercise - 03/13/19 0001      Self-Care   Self-Care  Other Self-Care Comments    Other Self-Care Comments   reviewed self edema mangement and exercise progression.       Hand Exercises   Thumb Opposition  10 x each finger    Other Hand Exercises  ;  finger spread x 15 reps with rubber band     Other Hand Exercises  prayer stretch for wrist extension x 10 reps 5 sec hold    gripping with passive end range stretch then hold with fingers in palm  Then eccentric resistance  finger flexors  then finger flexion squeezes into rolled towel x 10       Wrist Exercises   Wrist Flexion Limitations  3# 2x10    Wrist Extension Limitations  3#2x10     Other wrist exercises  --    Other wrist exercises  bicpes curl x20 3lb       Manual Therapy   Edema Management  right hand edema massgae. Therapy careful not to stretch patients wound as it is only partially healed     Passive ROM  IPs and wrist; focused on middle finger extension where trigger point release was done reviewed for HEP              PT Education - 03/13/19 1127    Education Details  Final HEP    Person(s) Educated  Patient    Methods  Explanation;Demonstration;Tactile cues;Verbal cues    Comprehension  Verbalized understanding;Returned demonstration;Verbal cues required;Tactile cues required          PT Long Term Goals - 03/13/19 1150      PT LONG TERM GOAL #1  Title  Pt will be able to make a full fist    Baseline  able to make a full fist    Time  4    Period  Weeks    Status  Achieved      PT LONG TERM GOAL #2   Title  wrist flx & ext to at least 50 deg    Baseline  WNLwrist movement    Time  4    Period  Weeks    Status  Achieved      PT LONG TERM GOAL #3   Title  Pt will be able to use dominant hand for daily activities- work and self care    Baseline  using it for writing and eating    Time  4    Period  Weeks    Status  Achieved      PT LONG TERM GOAL #4   Title  pt will be able to lift and carry light objects with Right hand    Baseline  able to lift/carry up to 10 pounds    Time  4    Period  Weeks    Status  Achieved            Plan - 03/13/19 1130    Clinical Impression Statement  The patient has reached gfgoals for therapy. She isn't having any pain. She is still having some swelling. Her trigger finger is straight enough for function. She tolerated exercises well today.    Comorbidities  persistent edema in other body parts,  HTN, RA    Examination-Activity Limitations  Bathing;Bed Mobility;Self Feeding;Carry;Sleep;Dressing;Hygiene/Grooming;Lift    Examination-Participation Restrictions  Meal Prep;Cleaning;Community Activity    Stability/Clinical Decision Making  Stable/Uncomplicated    Clinical Decision Making  Low    Rehab Potential  Good    PT Frequency  2x / week    PT Duration  3 weeks    PT Treatment/Interventions  ADLs/Self Care Home Management;Cryotherapy;Functional mobility training;Therapeutic activities;Therapeutic exercise;Patient/family education;Neuromuscular re-education;Manual techniques;Scar mobilization;Passive range of motion;Taping    PT Next Visit Plan  continue edema management, gross hand motion  add to HEP    Consulted and Agree with Plan of Care  Patient       Patient will benefit from skilled therapeutic intervention in order to improve the following deficits and impairments:  Decreased range of motion, Impaired UE functional use, Decreased activity tolerance, Impaired flexibility, Decreased strength  Visit Diagnosis: Stiffness of right hand, not elsewhere classified  Localized edema    PHYSICAL THERAPY DISCHARGE SUMMARY  Visits from Start of Care: 11 Current functional level related to goals / functional outcomes: Improved function    Remaining deficits: Swelling at times    Education / Equipment: HEP  Plan: Patient agrees to discharge.  Patient goals were not met. Patient is being discharged due to meeting the stated rehab goals.  ?????      Problem List Patient Active Problem List   Diagnosis Date Noted  . Status post trigger finger release 01/24/2019  . Carpal tunnel syndrome, right upper limb 12/27/2018  . Localized edema 02/12/2018  . Essential hypertension 02/12/2018  . Chronic pain of right knee 02/12/2018  . Abdominal pain 05/03/2017    Carney Living 03/13/2019, 11:52 AM  [facility] [address] [place], [place], [address] Phone: [phone]   Fax:  [phone]  Name: BRANDEY VANDALEN MRN: [id number] Date of Birth: 20-Jan-1941

## 2019-03-14 ENCOUNTER — Ambulatory Visit: Payer: Medicare Other | Admitting: Physical Therapy

## 2019-03-20 ENCOUNTER — Telehealth: Payer: Self-pay | Admitting: Cardiology

## 2019-03-20 ENCOUNTER — Ambulatory Visit: Payer: Medicare Other | Admitting: Internal Medicine

## 2019-03-20 ENCOUNTER — Ambulatory Visit: Payer: Medicare Other

## 2019-03-20 DIAGNOSIS — I10 Essential (primary) hypertension: Secondary | ICD-10-CM

## 2019-03-20 LAB — HM COLONOSCOPY

## 2019-03-20 MED ORDER — AMLODIPINE BESYLATE 5 MG PO TABS: 5.0000 mg | ORAL_TABLET | Freq: Every day | ORAL | 2 refills | Status: DC

## 2019-03-20 NOTE — Telephone Encounter (Signed)
Patient was seen by Dr. Juanita Craver today, blood pressure was markedly elevated today.  After she returned home, blood pressure was 150/100 mmHg.  This has been chronic, on review of her chart.  I will start her on amlodipine 5 mg daily, I will see her in our office on 04/11/2018.  Hypertension has been chronic.  She is asymptomatic.   ICD-10-CM   1. Essential hypertension  I10 amLODipine (NORVASC) 5 MG tablet

## 2019-03-25 ENCOUNTER — Encounter: Payer: Medicare Other | Admitting: Physical Therapy

## 2019-03-25 ENCOUNTER — Encounter: Payer: Self-pay | Admitting: Internal Medicine

## 2019-03-27 ENCOUNTER — Encounter: Payer: Self-pay | Admitting: Internal Medicine

## 2019-03-27 ENCOUNTER — Encounter: Payer: Medicare Other | Admitting: Physical Therapy

## 2019-03-28 LAB — HM MAMMOGRAPHY

## 2019-04-02 ENCOUNTER — Encounter: Payer: Self-pay | Admitting: Internal Medicine

## 2019-04-10 ENCOUNTER — Ambulatory Visit: Payer: Medicare Other | Admitting: Rheumatology

## 2019-04-12 ENCOUNTER — Ambulatory Visit: Payer: Self-pay | Admitting: Cardiology

## 2019-04-12 ENCOUNTER — Other Ambulatory Visit: Payer: Self-pay

## 2019-04-12 ENCOUNTER — Ambulatory Visit: Payer: Medicare PPO | Admitting: Cardiology

## 2019-04-12 ENCOUNTER — Encounter: Payer: Self-pay | Admitting: Cardiology

## 2019-04-12 VITALS — BP 160/96 | HR 82 | Ht 62.0 in | Wt 161.9 lb

## 2019-04-12 DIAGNOSIS — I10 Essential (primary) hypertension: Secondary | ICD-10-CM

## 2019-04-12 DIAGNOSIS — R600 Localized edema: Secondary | ICD-10-CM

## 2019-04-12 MED ORDER — SPIRONOLACTONE-HCTZ 25-25 MG PO TABS: 1.0000 | ORAL_TABLET | Freq: Every day | ORAL | 2 refills | Status: DC

## 2019-04-12 MED ORDER — FUROSEMIDE 20 MG PO TABS: 20.0000 mg | ORAL_TABLET | Freq: Every day | ORAL | Status: DC | PRN

## 2019-04-12 NOTE — Progress Notes (Signed)
Primary Physician/Referring:  Tina Chard, MD  [name], female    DOB: Feb 11, 1941, 79 y.o.   MRN: [id number]  Chief Complaint  Patient presents with  . Hypertension  . Edema   HPI:    Tina Ware  is a 79 y.o. female with PMH significant for hypertension, prediabetes, hypercholesterolemia, rheumatoid arthritis. Patient presents to me for follow-up and evaluation of hypertension, shortness of breath and leg edema. I had last seen her in 2015.  Or the past 2-3 months, she has noticed marked elevation in blood pressure.  Recently had endoscopy and also noted to have elevated blood pressure.  Denies chest pain, palpitations, dizziness or syncope.  She does have chronic bilateral leg edema right leg worse than the left.  Past Medical History:  Diagnosis Date  . Arthritis    RA  . CAD (coronary artery disease)   . Fibroid, uterine   . Hypertension    Past Surgical History:  Procedure Laterality Date  . CARPAL TUNNEL RELEASE Right 12/20/2018  . CATARACT EXTRACTION, BILATERAL    . CHOLECYSTECTOMY    . TRIGGER FINGER RELEASE Right 12/20/2018   Social History   Tobacco Use  . Smoking status: Never Smoker  . Smokeless tobacco: Never Used  Substance Use Topics  . Alcohol use: No    Alcohol/week: 0.0 standard drinks   ROS  Review of Systems  Constitution: Negative for weight gain.  Cardiovascular: Positive for leg swelling. Negative for dyspnea on exertion and syncope.  Respiratory: Negative for hemoptysis.   Endocrine: Negative for cold intolerance.  Hematologic/Lymphatic: Does not bruise/bleed easily.  Gastrointestinal: Negative for hematochezia and melena.  Neurological: Negative for headaches and light-headedness.   Objective  Blood pressure (!) 160/96, pulse 82, height 5\' 2"  (1.575 m), weight 161 lb 14.4 oz (73.4 kg), SpO2 97 %.  Vitals with BMI 04/12/2019 03/06/2019 02/27/2019  Height 5\' 2"  5\' 3"  5\' 3"   Weight 161 lbs 14 oz 163 lbs 163 lbs   BMI 29.6 [id number]  Systolic 0000000 - [id number]  Diastolic 96 - 96  Pulse 82 - 86     Physical Exam  Constitutional:  Well built and mildly obese and appears younger than stated age  Neck: No thyromegaly present.  Cardiovascular: Normal rate, regular rhythm, normal heart sounds and intact distal pulses. Exam reveals no gallop.  No murmur heard. Pulses:      Dorsalis pedis pulses are 2+ on the right side and 2+ on the left side.       Posterior tibial pulses are 1+ on the right side and 1+ on the left side.  2+ right below leg edema and 1-2+ left leg edema, no JVD.  Pulmonary/Chest: Effort normal and breath sounds normal.  Abdominal: Soft. Bowel sounds are normal.  obese  Musculoskeletal:     Cervical back: Neck supple.  Skin: Skin is warm and dry.   Laboratory examination:   Recent Labs    12/13/18 1700 01/09/19 1606 02/27/19 1551  NA 136 138 133*  K 4.1 3.9 4.2  CL 99 103 99  CO2 26 25 25   GLUCOSE 81 82 110*  BUN 6* 10 9  CREATININE 0.72 0.74 0.68  CALCIUM 9.3 9.3 9.4  GFRNONAA 81 78 85  GFRAA 93 90 98   CrCl cannot be calculated (Patient's most recent lab result is older than the maximum 21 days allowed.).  CMP Latest Ref Rng & Units 02/27/2019 01/09/2019 12/13/2018  Glucose 65 - 99 mg/dL  110(H) 82 81  BUN 8 - 27 mg/dL 9 10 6(L)  Creatinine 0.57 - 1.00 mg/dL 0.68 0.74 0.72  Sodium 134 - 144 mmol/L 133(L) 138 136  Potassium 3.5 - 5.2 mmol/L 4.2 3.9 4.1  Chloride 96 - 106 mmol/L 99 103 99  CO2 20 - 29 mmol/L 25 25 26   Calcium 8.7 - 10.3 mg/dL 9.4 9.3 9.3  Total Protein 6.5 - 8.1 g/dL - - -  Total Bilirubin 0.3 - 1.2 mg/dL - - -  Alkaline Phos 38 - 126 U/L - - -  AST 15 - 41 U/L - - -  ALT 14 - 54 U/L - - -   CBC Latest Ref Rng & Units 01/23/2019 10/16/2018 05/02/2017  WBC 3.4 - 10.8 x10E3/uL 3.7 3.9 5.4  Hemoglobin 11.1 - 15.9 g/dL 13.9 14.6 16.1(H)  Hematocrit 34.0 - 46.6 % 40.8 41.7 44.0  Platelets 150 - 450 x10E3/uL 187 185 138(L)   Lipid Panel     Component  Value Date/Time   CHOL 176 10/16/2018 0846   TRIG 44 10/16/2018 0846   HDL 88 10/16/2018 0846   CHOLHDL 2.0 10/16/2018 0846   LDLCALC 79 10/16/2018 0846   HEMOGLOBIN A1C Lab Results  Component Value Date   HGBA1C 5.7 (H) 06/18/2018   TSH Recent Labs    10/16/18 0846  TSH 2.170   Medications and allergies  No Known Allergies   Current Outpatient Medications  Medication Instructions  . aspirin EC 81 mg, Daily  . benzonatate (TESSALON) 100 MG capsule TAKE ONE CAPSULE BY MOUTH 3 TIMES A DAY AS NEEDED FOR COUGH  . carvedilol (COREG) 6.25 MG tablet TAKE 1 TABLET BY MOUTH TWICE A DAY WITH FOOD  . cholecalciferol (VITAMIN D) 1,000 Units, Oral, Daily  . furosemide (LASIX) 20 mg, Oral, Daily PRN  . HYDROcodone-acetaminophen (NORCO/VICODIN) 5-325 MG tablet 1 tablet, Oral, Every 6 hours PRN  . hydrocortisone (ANUSOL-HC) 25 mg, Rectal, 2 times daily PRN  . losartan (COZAAR) 100 mg, Oral, Daily  . Magnesium 250 MG TABS Take 1 tablet by mouth with evening meal  . mometasone (NASONEX) 50 MCG/ACT nasal spray 2 sprays, Nasal, Daily PRN  . Multiple Vitamin (MULTIVITAMIN WITH MINERALS) TABS tablet 1 tablet, Oral, Daily  . Omega-3 1,000 mg, Oral, Daily  . spironolactone-hydrochlorothiazide (ALDACTAZIDE) 25-25 MG tablet 1 tablet, Oral, Daily    Radiology:  No results found.  Cardiac Studies:   Echocardiogram 12/12/2013: Left ventricle cavity is normal in size.  Mild concentric hypertrophy of the left ventricle. Mild basal septal hypertropy.  Visual EF is 55-60%. Calculated EF 53%. No wall motion abnormalities.  Mild mitral regurgitation. Mild tricuspid regurgitation. No evidence of pulmonary hypertension.   Assessment     ICD-10-CM   1. Essential hypertension  I10 EKG 12-Lead    spironolactone-hydrochlorothiazide (ALDACTAZIDE) 25-25 MG tablet    BASIC METABOLIC PANEL WITH GFR    BASIC METABOLIC PANEL WITH GFR  2. Leg edema  R60.0 spironolactone-hydrochlorothiazide (ALDACTAZIDE) 25-25  MG tablet    furosemide (LASIX) 20 MG tablet    EKG 04/12/2019: Normal sinus rhythm with rate of 78 bpm, left atrial enlargement, left axis deviation, left anterior fascicular block.  Poor R wave progression, cannot exclude anteroseptal infarct old.  No evidence of ischemia.      Meds ordered this encounter  Medications  . spironolactone-hydrochlorothiazide (ALDACTAZIDE) 25-25 MG tablet    Sig: Take 1 tablet by mouth daily.    Dispense:  30 tablet    Refill:  2  .  furosemide (LASIX) 20 MG tablet    Sig: Take 1 tablet (20 mg total) by mouth daily as needed for edema.    Medications Discontinued During This Encounter  Medication Reason  . traMADol (ULTRAM) 50 MG tablet Patient Preference  . amLODipine (NORVASC) 5 MG tablet Patient Preference  . furosemide (LASIX) 20 MG tablet     Recommendations:   Tina Ware  is a 79 y.o. female with PMH significant for hypertension, prediabetes, hypercholesterolemia, rheumatoid arthritis. Patient presents to me for follow-up and evaluation of hypertension, shortness of breath and leg edema. I had last seen her in 2015.  Patient presents here to reestablish care as she has noticed her blood pressure to be extremely high.  Physical examination is unremarkable, she looks charming young for her age.  Blood pressure in spite of waiting for 30 minutes is still elevated.  Manual pressure was checked.  Advised her to take furosemide on a p.r.n. basis, switched her to Aldactazide 25/25 mg number morning.  She did not like amlodipine that I prescribed when Dr. Collene Mares had coronary after endoscopy stating that her blood pressure has been very high.  Patient states that in the past she has had arthralgias with amlodipine. If her blood pressure is not controlled, could consider changing her carvedilol to labetalol is higher dose.   I reviewed her labs, lipids are under excellent control, renal function is normal, otherwise no significant symptoms, hopefully the  change in the medications will also help with leg edema.  Leg edema is chronic.  No clinical evidence of heart failure.  Adrian Prows, MD, [facility] 04/12/2019, 3:12 PM [facility] [place] Office: [phone]

## 2019-04-21 ENCOUNTER — Ambulatory Visit: Payer: Medicare PPO | Attending: Internal Medicine

## 2019-04-21 DIAGNOSIS — Z23 Encounter for immunization: Secondary | ICD-10-CM | POA: Insufficient documentation

## 2019-04-22 NOTE — Progress Notes (Signed)
   Covid-19 Vaccination Clinic  Name:  Tina Ware    MRN: [id number] DOB: 1941/01/18  04/21/2019  [name] was observed post Covid-19 immunization for 15 minutes without incidence. She was provided with Vaccine Information Sheet and instruction to access the V-Safe system.   [name] was instructed to call 911 with any severe reactions post vaccine: Marland Kitchen Difficulty breathing  . Swelling of your face and throat  . A fast heartbeat  . A bad rash all over your body  . Dizziness and weakness    Immunizations Administered    Name Date Dose VIS Date Route   Moderna COVID-19 Vaccine 04/21/2019  4:00 PM 0.5 mL 02/26/2019 Intramuscular   Manufacturer: Levan Hurst   Lot: [id number]   [place]:[id number]      Documented on behalf of: C. Jerline Pain

## 2019-04-29 ENCOUNTER — Other Ambulatory Visit: Payer: Self-pay | Admitting: Internal Medicine

## 2019-04-29 DIAGNOSIS — R600 Localized edema: Secondary | ICD-10-CM

## 2019-05-07 ENCOUNTER — Other Ambulatory Visit: Payer: Self-pay | Admitting: Cardiology

## 2019-05-07 DIAGNOSIS — I10 Essential (primary) hypertension: Secondary | ICD-10-CM | POA: Diagnosis not present

## 2019-05-08 LAB — BASIC METABOLIC PANEL
BUN/Creatinine Ratio: 14 (ref 12–28)
BUN: 10 mg/dL (ref 8–27)
CO2: 24 mmol/L (ref 20–29)
Calcium: 9.6 mg/dL (ref 8.7–10.3)
Chloride: 94 mmol/L — ABNORMAL LOW (ref 96–106)
Creatinine, Ser: 0.73 mg/dL (ref 0.57–1.00)
GFR calc Af Amer: 91 mL/min/{1.73_m2} (ref >59)
GFR calc non Af Amer: 79 mL/min/{1.73_m2} (ref >59)
Glucose: 89 mg/dL (ref 65–99)
Potassium: 4.6 mmol/L (ref 3.5–5.2)
Sodium: 131 mmol/L — ABNORMAL LOW (ref 134–144)

## 2019-05-14 ENCOUNTER — Other Ambulatory Visit: Payer: Self-pay | Admitting: Internal Medicine

## 2019-05-19 ENCOUNTER — Ambulatory Visit: Payer: Medicare PPO | Attending: Internal Medicine

## 2019-05-19 DIAGNOSIS — Z23 Encounter for immunization: Secondary | ICD-10-CM | POA: Insufficient documentation

## 2019-05-19 NOTE — Progress Notes (Signed)
   Covid-19 Vaccination Clinic  Name:  Tina Ware    MRN: [id number] DOB: 04-Dec-1940  05/19/2019  Tina Ware was observed post Covid-19 immunization for 15 minutes without incidence. She was provided with Vaccine Information Sheet and instruction to access the V-Safe system.   Tina Ware was instructed to call 911 with any severe reactions post vaccine: Marland Kitchen Difficulty breathing  . Swelling of your face and throat  . A fast heartbeat  . A bad rash all over your body  . Dizziness and weakness    Immunizations Administered    Name Date Dose VIS Date Route   Moderna COVID-19 Vaccine 05/19/2019  3:59 PM 0.5 mL 02/26/2019 Intramuscular   Manufacturer: Moderna   Lot: [id number]   [place]:[id number]

## 2019-05-27 NOTE — Progress Notes (Signed)
Office Visit Note  Patient: Tina Ware             Date of Birth: [birth year]           MRN: [id number]             PCP: Glendale Chard, MD Referring: Glendale Chard, MD Visit Date: 05/30/2019 Occupation: @GUAROCC @  Subjective:  Numbness of fingertips  History of Present Illness: Tina Ware is a 79 y.o. female with a past medical history of osteoarthritis and positive rheumatoid factor. In September, she had a right carpal tunnel release surgery and right trigger finger release surgery performed by Dr. Durward Fortes. She received physical therapy for a month following the surgery. She denies any joint pain in her bilateral hands at this time. She states that she experiences occasional swelling of her right hand. She also complains of numbness in her bilateral finger tips. She has a persistent right middle trigger finger that she has received injections for in the past, as well as a recent surgery. She denies any other joint pain. She endorses bilateral swelling in her ankles for which she wears compression socks. She denies any other joint swelling. She endorses blood in her stool for which she had a colonoscopy performed on December 23rd. She states that this study did not show anything and attests the bleeding to severe hemorrhoids. She has used stool softeners in the past, but has recently switched to prune juice. This has been helpful.   Activities of Daily Living:  Patient reports morning stiffness for 30-60 minutes.   Patient Denies nocturnal pain.  Difficulty dressing/grooming: Denies Difficulty climbing stairs: Denies Difficulty getting out of chair: Denies Difficulty using hands for taps, buttons, cutlery, and/or writing: Denies  Review of Systems  Constitutional: Positive for fatigue.  HENT: Positive for mouth dryness. Negative for mouth sores and nose dryness.   Eyes: Negative for itching and dryness.  Respiratory: Negative for shortness of breath and difficulty  breathing.   Cardiovascular: Positive for swelling in legs/feet. Negative for chest pain and palpitations.  Gastrointestinal: Positive for blood in stool and constipation. Negative for diarrhea.  Endocrine: Negative for increased urination.  Genitourinary: Negative for difficulty urinating and painful urination.  Musculoskeletal: Positive for arthralgias, joint pain and morning stiffness. Negative for joint swelling.  Allergic/Immunologic: Negative for susceptible to infections.  Neurological: Negative for dizziness, light-headedness, headaches, memory loss and weakness.  Hematological: Negative for bruising/bleeding tendency.  Psychiatric/Behavioral: Negative for confusion.    PMFS History:  Patient Active Problem List   Diagnosis Date Noted  . Status post trigger finger release 01/24/2019  . Carpal tunnel syndrome, right upper limb 12/27/2018  . Localized edema 02/12/2018  . Essential hypertension 02/12/2018  . Chronic pain of right knee 02/12/2018  . Abdominal pain 05/03/2017    Past Medical History:  Diagnosis Date  . Arthritis    RA  . CAD (coronary artery disease)   . Fibroid, uterine   . Hypertension     Family History  Problem Relation Age of Onset  . Heart disease Mother   . Dementia Mother   . Congestive Heart Failure Mother   . Parkinson's disease Father   . Pancreatic cancer Sister    Past Surgical History:  Procedure Laterality Date  . CARPAL TUNNEL RELEASE Right 12/20/2018  . CATARACT EXTRACTION, BILATERAL    . CHOLECYSTECTOMY    . TRIGGER FINGER RELEASE Right 12/20/2018   Social History   Social History Narrative  . Not on file  Immunization History  Administered Date(s) Administered  . Influenza, High Dose Seasonal PF 01/15/2018, 01/09/2019  . Moderna SARS-COVID-2 Vaccination 04/21/2019, 05/19/2019  . Pneumococcal Conjugate-13 12/13/2018  . Tdap 07/06/2017  . Zoster Recombinat (Shingrix) 11/09/2018, 02/28/2019, 03/26/2019      Objective: Vital Signs: BP 138/82 (BP Location: Left Arm, Patient Position: Sitting, Cuff Size: Normal)   Pulse 67   Resp 13   Ht 5\' 2"  (1.575 m)   Wt 164 lb 3.2 oz (74.5 kg)   BMI 30.03 kg/m    Physical Exam Constitutional:      General: She is not in acute distress.    Appearance: Normal appearance.  HENT:     Head: Normocephalic and atraumatic.  Eyes:     Conjunctiva/sclera: Conjunctivae normal.  Cardiovascular:     Rate and Rhythm: Normal rate.  Pulmonary:     Effort: Pulmonary effort is normal.  Abdominal:     Palpations: Abdomen is soft.  Musculoskeletal:        General: No tenderness. Normal range of motion.     Cervical back: Normal range of motion. No tenderness.     Right lower leg: No edema.     Left lower leg: No edema.  Skin:    General: Skin is warm and dry.  Neurological:     Mental Status: She is oriented to person, place, and time.  Psychiatric:        Behavior: Behavior normal.     Musculoskeletal Exam: Cervical spine, thoracic spine, and lumbar spine with good ROM and no discomfort. No midline spinal tenderness or SI joint tenderness. Shoulder joints and elbow joints with good ROM. Limited range of motion of bilateral wrists. Good ROM of MCPs, PIPs, and DIPs. Some difficulty and stiffness noted with complete fist formation. Edema noted over dorsum of her right hand. Right middle trigger finger present. Bilateral PIP and DIP  joint thickening. Hip joints, knee joints, ankle joints, MTPs, and PIPs with good ROM. No warmth or effusion of bilateral knees. No tenderness or edema of bilateral ankle joints.   CDAI Exam: CDAI Score: -- Patient Global: --; Provider Global: -- Swollen: --; Tender: -- Joint Exam 05/30/2019   No joint exam has been documented for this visit   There is currently no information documented on the homunculus. Go to the Rheumatology activity and complete the homunculus joint exam.  Investigation: No additional  findings.  Imaging: No results found.  Recent Labs: Lab Results  Component Value Date   WBC 3.7 01/23/2019   HGB 13.9 01/23/2019   PLT 187 01/23/2019   NA 131 (L) 05/07/2019   K 4.6 05/07/2019   CL 94 (L) 05/07/2019   CO2 24 05/07/2019   GLUCOSE 89 05/07/2019   BUN 10 05/07/2019   CREATININE 0.73 05/07/2019   BILITOT 0.9 04/29/2017   ALKPHOS 67 04/29/2017   AST 29 04/29/2017   ALT 17 04/29/2017   PROT 7.2 04/29/2017   ALBUMIN 3.8 04/29/2017   CALCIUM 9.6 05/07/2019   GFRAA 91 05/07/2019    Speciality Comments: No specialty comments available.  Procedures:  No procedures performed Allergies: Patient has no known allergies.   Assessment / Plan:     Visit Diagnoses: Primary osteoarthritis of both hands - Rheumatoid factor positive. She denies any bilateral hand pain at this time. She recently underwent a right carpal tunnel release in September of 2020. She experiences occasional swelling of her right hand. She also experiences bilateral finger tip numbness. She was not tender on  examination. Edema present on the dorsum of the right . Encouraged hand exercises and a compression glove to help resolve the swelling.  I also advised her to follow-up with Dr. Durward Fortes if needed.  Bilateral carpal tunnel syndrome - A right carpal tunnel release was performed in September by Dr. Durward Fortes. She received a month of physical therapy following the surgery. She denies any hand or wrist pain at this time. She continues to experience numbness of her bilateral finger tips. Encouraged hand strengthening exercises and a compression glove to help relieve swelling.   Rheumatoid factor positive  Trigger finger, right middle finger - She has a persistent right middle trigger finger. She underwent a right middle trigger finger release in September of 2020 by Dr. Durward Fortes.   Primary osteoarthritis of right knee - She is not experiencing any knee joint pain at this time.   Osteopenia of multiple  sites - DEXA performed on 02/2018 by PCP. She currently takes vitamin D supplementation.   Essential hypertension  Orders: No orders of the defined types were placed in this encounter.  No orders of the defined types were placed in this encounter.    Follow-Up Instructions: Return in about 6 months (around 11/30/2019) for Osteoarthritis.   Bo Merino, MD  Note - This record has been created using Editor, commissioning.  Chart creation errors have been sought, but may not always  have been located. Such creation errors do not reflect on  the standard of medical care.

## 2019-05-29 ENCOUNTER — Ambulatory Visit: Payer: Medicare PPO | Admitting: Cardiology

## 2019-05-29 ENCOUNTER — Encounter: Payer: Self-pay | Admitting: Cardiology

## 2019-05-29 ENCOUNTER — Other Ambulatory Visit: Payer: Self-pay

## 2019-05-29 VITALS — BP 148/90 | HR 73 | Temp 97.2°F | Ht 63.0 in | Wt 164.0 lb

## 2019-05-29 DIAGNOSIS — R5381 Other malaise: Secondary | ICD-10-CM | POA: Diagnosis not present

## 2019-05-29 DIAGNOSIS — R5383 Other fatigue: Secondary | ICD-10-CM

## 2019-05-29 DIAGNOSIS — R400 Somnolence: Secondary | ICD-10-CM

## 2019-05-29 DIAGNOSIS — R0609 Other forms of dyspnea: Secondary | ICD-10-CM

## 2019-05-29 DIAGNOSIS — R0600 Dyspnea, unspecified: Secondary | ICD-10-CM

## 2019-05-29 DIAGNOSIS — R600 Localized edema: Secondary | ICD-10-CM

## 2019-05-29 DIAGNOSIS — I10 Essential (primary) hypertension: Secondary | ICD-10-CM | POA: Diagnosis not present

## 2019-05-29 MED ORDER — LABETALOL HCL 200 MG PO TABS: 200.0000 mg | ORAL_TABLET | Freq: Two times a day (BID) | ORAL | 3 refills | Status: DC

## 2019-05-29 NOTE — Progress Notes (Signed)
Primary Physician/Referring:  Glendale Chard, MD  Patient ID: [name], female    DOB: [birth year], 79 y.o.   MRN: [id number]  Chief Complaint  Patient presents with  . Hypertension    Follow up  . Edema   HPI:    [name]  is a 79 y.o. female with PMH significant for hypertension, prediabetes, hypercholesterolemia, rheumatoid arthritis who I had seen 6 weeks ago for difficult to control hypertension and leg edema presents here for follow-up.  On her last office visit I had switched her furosemide to be taken on a as needed basis and added Aldactazide.  Or the past 2-3 months, she has noticed marked elevation in blood pressure. She does have chronic bilateral leg edema right leg worse than the left.  On her last office visit I discontinued Lasix that she was taking on a daily basis then switched her to Aldactazide.  She now presents for follow-up.  Continues to have chronic dyspnea on exertion, no PND orthopnea, leg edema has not improved, she has noticed improvement in blood pressure.  No headache, nausea or vomiting.  Past Medical History:  Diagnosis Date  . Arthritis    RA  . CAD (coronary artery disease)   . Fibroid, uterine   . Hypertension    Past Surgical History:  Procedure Laterality Date  . CARPAL TUNNEL RELEASE Right 12/20/2018  . CATARACT EXTRACTION, BILATERAL    . CHOLECYSTECTOMY    . TRIGGER FINGER RELEASE Right 12/20/2018   Social History   Tobacco Use  . Smoking status: Never Smoker  . Smokeless tobacco: Never Used  Substance Use Topics  . Alcohol use: No    Alcohol/week: 0.0 standard drinks   ROS  Review of Systems  Cardiovascular: Positive for leg swelling. Negative for chest pain and dyspnea on exertion.  Gastrointestinal: Negative for melena.   Objective  Blood pressure (!) 148/90, pulse 73, temperature (!) 97.2 F (36.2 C), height 5\' 3"  (1.6 m), weight 164 lb (74.4 kg).  Vitals with BMI 05/29/2019 05/29/2019 04/12/2019  Height - 5'  3" 5\' 2"   Weight - 164 lbs 161 lbs 14 oz  BMI - 0000000 0000000  Systolic 123456 123456 0000000  Diastolic 90 98 96  Pulse 73 86 82     Physical Exam  Constitutional:  Well built and mildly obese and appears younger than stated age  Cardiovascular: Normal rate, regular rhythm, normal heart sounds and intact distal pulses. Exam reveals no gallop.  No murmur heard. Pulses:      Dorsalis pedis pulses are 2+ on the right side and 2+ on the left side.       Posterior tibial pulses are 1+ on the right side and 1+ on the left side.  2+ right below leg edema and 1-2+ left leg edema, large adipose tissue present. No JVD.  Pulmonary/Chest: Effort normal and breath sounds normal.  Abdominal: Soft. Bowel sounds are normal.  obese   Laboratory examination:   Recent Labs    01/09/19 1606 02/27/19 1551 05/07/19 1421  NA 138 133* 131*  K 3.9 4.2 4.6  CL 103 99 94*  CO2 25 25 24   GLUCOSE 82 110* 89  BUN 10 9 10   CREATININE 0.74 0.68 0.73  CALCIUM 9.3 9.4 9.6  GFRNONAA 78 85 79  GFRAA 90 98 91   CrCl cannot be calculated (Patient's most recent lab result is older than the maximum 21 days allowed.).  CMP Latest Ref Rng & Units 05/07/2019  02/27/2019 01/09/2019  Glucose 65 - 99 mg/dL 89 110(H) 82  BUN 8 - 27 mg/dL 10 9 10   Creatinine 0.57 - 1.00 mg/dL 0.73 0.68 0.74  Sodium 134 - 144 mmol/L 131(L) 133(L) 138  Potassium 3.5 - 5.2 mmol/L 4.6 4.2 3.9  Chloride 96 - 106 mmol/L 94(L) 99 103  CO2 20 - 29 mmol/L 24 25 25   Calcium 8.7 - 10.3 mg/dL 9.6 9.4 9.3  Total Protein 6.5 - 8.1 g/dL - - -  Total Bilirubin 0.3 - 1.2 mg/dL - - -  Alkaline Phos 38 - 126 U/L - - -  AST 15 - 41 U/L - - -  ALT 14 - 54 U/L - - -   CBC Latest Ref Rng & Units 01/23/2019 10/16/2018 05/02/2017  WBC 3.4 - 10.8 x10E3/uL 3.7 3.9 5.4  Hemoglobin 11.1 - 15.9 g/dL 13.9 14.6 16.1(H)  Hematocrit 34.0 - 46.6 % 40.8 41.7 44.0  Platelets 150 - 450 x10E3/uL 187 185 138(L)   Lipid Panel     Component Value Date/Time   CHOL 176  10/16/2018 0846   TRIG 44 10/16/2018 0846   HDL 88 10/16/2018 0846   CHOLHDL 2.0 10/16/2018 0846   LDLCALC 79 10/16/2018 0846   HEMOGLOBIN A1C Lab Results  Component Value Date   HGBA1C 5.7 (H) 06/18/2018   TSH Recent Labs    10/16/18 0846  TSH 2.170   Medications and allergies  No Known Allergies   Current Outpatient Medications  Medication Instructions  . aspirin EC 81 mg, Daily  . benzonatate (TESSALON) 100 MG capsule TAKE ONE CAPSULE BY MOUTH 3 TIMES A DAY AS NEEDED FOR COUGH  . cholecalciferol (VITAMIN D) 1,000 Units, Oral, Daily  . HYDROcodone-acetaminophen (NORCO/VICODIN) 5-325 MG tablet 1 tablet, Oral, Every 6 hours PRN  . hydrocortisone (ANUSOL-HC) 25 mg, Rectal, 2 times daily PRN  . labetalol (NORMODYNE) 200 mg, Oral, 2 times daily  . losartan (COZAAR) 100 mg, Oral, Daily  . Magnesium 250 MG TABS Take 1 tablet by mouth with evening meal  . mometasone (NASONEX) 50 MCG/ACT nasal spray 2 sprays, Nasal, Daily PRN  . Multiple Vitamin (MULTIVITAMIN WITH MINERALS) TABS tablet 1 tablet, Oral, Daily  . Omega-3 1,000 mg, Oral, Daily  . spironolactone-hydrochlorothiazide (ALDACTAZIDE) 25-25 MG tablet 1 tablet, Oral, Daily    Radiology:  No results found.  Cardiac Studies:   Treadmill Stress 12/16/2013: Indications: Shortness of breath. Conclusions: Negative for ischemia. The patient exercised according to the Bruce protocol, Total time recorded 4 Min. 10 sec. achieving a max heart rate of 137 which was 92% of MPHR for age and 4.9 METS of work. The baseline ECG showed NSR,low voltage. During exercise there was no ST-T changes of ischemia. Symptoms: Dyspnea. Achieved 91% MPHR. Arrhythmia: None. Baseline NIBP was 132/82. Peak NIBP was 132/82 MaxSysp was: 174 MaxDiasp was: 90. Mild decrease in functional capacity. Continue primary prevention.  Echocardiogram 12/12/2013: Left ventricle cavity is normal in size.  Mild concentric hypertrophy of the left  ventricle. Mild basal septal hypertropy.  Visual EF is 55-60%. Calculated EF 53%. No wall motion abnormalities.  Mild mitral regurgitation. Mild tricuspid regurgitation. No evidence of pulmonary hypertension.   Assessment     ICD-10-CM   1. Resistant hypertension  I10 PCV ECHOCARDIOGRAM COMPLETE    labetalol (NORMODYNE) 200 MG tablet    CANCELED: Ambulatory referral to Sleep Studies  2. Leg edema  R60.0 Ambulatory referral to Interventional Radiology    Compression stockings  3. Malaise and fatigue  R53.81 CANCELED: Ambulatory referral to Sleep Studies   R53.83   4. Uncontrolled daytime somnolence  R40.0 CANCELED: Ambulatory referral to Sleep Studies  5. Bilateral leg edema  R60.0   6. Dyspnea on exertion  R06.00 PCV ECHOCARDIOGRAM COMPLETE    EKG 04/12/2019: Normal sinus rhythm with rate of 78 bpm, left atrial enlargement, left axis deviation, left anterior fascicular block.  Poor R wave progression, cannot exclude anteroseptal infarct old.  No evidence of ischemia.      Meds ordered this encounter  Medications  . labetalol (NORMODYNE) 200 MG tablet    Sig: Take 1 tablet (200 mg total) by mouth 2 (two) times daily.    Dispense:  60 tablet    Refill:  3    Medications Discontinued During This Encounter  Medication Reason  . furosemide (LASIX) 20 MG tablet Change in therapy  . carvedilol (COREG) 6.25 MG tablet Change in therapy    Recommendations:   Rozanna W Cantlon  is a 79 y.o. female with PMH significant for hypertension, prediabetes, hypercholesterolemia, rheumatoid arthritis who I had seen 6 weeks ago for difficult to control hypertension and leg edema presents here for follow-up.  On her last office visit I had switched her furosemide to be taken on a as needed basis and added Aldactazide.   Blood pressure still remains uncontrolled, suspect related to her lifestyle and diet.  We will discontinue carvedilol and switch her to labetalol 200 mg p.o. twice daily.  Her leg edema  is related to excessive fluid intake and free water intake.  She probably also has venous insufficiency.  Support stockings prescribed and referral made for venous insufficiency study.  She has marked daytime somnolence, she is even afraid to drive, she probably will need a sleep study.  But on questioning, due to incessant water drinking in the evening, she has been getting up at least 4-6 times per night to go to the bathroom.  She will make these changes with regard to fluid restriction in the evening and see if her symptoms improve if not she will need sleep study.  I canceled the referral made.  Primary etiology for nocturnal diuresis need to be evaluated further.  Support stockings were prescribed to the patient and fitted.  Extensive discussion was held regarding her diet and lifestyle changes to be performed.  I would like to see her back in 4 to 6 weeks for follow-up, I will repeat echocardiogram in view of dyspnea and uncontrolled hypertension.  Although clinically no obvious evidence of congestive heart failure except for leg edema.  Adrian Prows, MD, [facility] 05/29/2019, 10:24 PM [facility] [place] Office: [phone]

## 2019-05-30 ENCOUNTER — Encounter: Payer: Self-pay | Admitting: Physician Assistant

## 2019-05-30 ENCOUNTER — Ambulatory Visit: Payer: Medicare PPO | Admitting: Rheumatology

## 2019-05-30 VITALS — BP 138/82 | HR 67 | Resp 13 | Ht 62.0 in | Wt 164.2 lb

## 2019-05-30 DIAGNOSIS — M65331 Trigger finger, right middle finger: Secondary | ICD-10-CM

## 2019-05-30 DIAGNOSIS — M19041 Primary osteoarthritis, right hand: Secondary | ICD-10-CM | POA: Diagnosis not present

## 2019-05-30 DIAGNOSIS — I10 Essential (primary) hypertension: Secondary | ICD-10-CM | POA: Diagnosis not present

## 2019-05-30 DIAGNOSIS — M1711 Unilateral primary osteoarthritis, right knee: Secondary | ICD-10-CM | POA: Diagnosis not present

## 2019-05-30 DIAGNOSIS — G5603 Carpal tunnel syndrome, bilateral upper limbs: Secondary | ICD-10-CM

## 2019-05-30 DIAGNOSIS — M8589 Other specified disorders of bone density and structure, multiple sites: Secondary | ICD-10-CM | POA: Diagnosis not present

## 2019-05-30 DIAGNOSIS — R768 Other specified abnormal immunological findings in serum: Secondary | ICD-10-CM | POA: Diagnosis not present

## 2019-05-30 DIAGNOSIS — M19042 Primary osteoarthritis, left hand: Secondary | ICD-10-CM

## 2019-06-04 ENCOUNTER — Other Ambulatory Visit: Payer: Self-pay

## 2019-06-04 ENCOUNTER — Inpatient Hospital Stay (HOSPITAL_COMMUNITY)
Admission: EM | Admit: 2019-06-04 | Discharge: 2019-06-08 | DRG: 312 | Disposition: A | Discharge status: Home Health | Payer: Medicare PPO | Attending: Internal Medicine | Admitting: Internal Medicine

## 2019-06-04 ENCOUNTER — Other Ambulatory Visit: Payer: Medicare PPO

## 2019-06-04 ENCOUNTER — Emergency Department (HOSPITAL_COMMUNITY): Payer: Medicare PPO

## 2019-06-04 ENCOUNTER — Observation Stay (HOSPITAL_COMMUNITY): Payer: Medicare PPO

## 2019-06-04 ENCOUNTER — Other Ambulatory Visit: Payer: Self-pay | Admitting: Internal Medicine

## 2019-06-04 ENCOUNTER — Encounter (HOSPITAL_COMMUNITY): Payer: Self-pay | Admitting: Emergency Medicine

## 2019-06-04 DIAGNOSIS — R600 Localized edema: Secondary | ICD-10-CM | POA: Diagnosis present

## 2019-06-04 DIAGNOSIS — M069 Rheumatoid arthritis, unspecified: Secondary | ICD-10-CM | POA: Diagnosis present

## 2019-06-04 DIAGNOSIS — Y92009 Unspecified place in unspecified non-institutional (private) residence as the place of occurrence of the external cause: Secondary | ICD-10-CM | POA: Diagnosis not present

## 2019-06-04 DIAGNOSIS — E871 Hypo-osmolality and hyponatremia: Secondary | ICD-10-CM | POA: Diagnosis present

## 2019-06-04 DIAGNOSIS — R519 Headache, unspecified: Secondary | ICD-10-CM | POA: Diagnosis not present

## 2019-06-04 DIAGNOSIS — R55 Syncope and collapse: Secondary | ICD-10-CM | POA: Diagnosis present

## 2019-06-04 DIAGNOSIS — S0101XA Laceration without foreign body of scalp, initial encounter: Secondary | ICD-10-CM | POA: Diagnosis present

## 2019-06-04 DIAGNOSIS — Z820 Family history of epilepsy and other diseases of the nervous system: Secondary | ICD-10-CM

## 2019-06-04 DIAGNOSIS — D259 Leiomyoma of uterus, unspecified: Secondary | ICD-10-CM | POA: Diagnosis present

## 2019-06-04 DIAGNOSIS — Z20822 Contact with and (suspected) exposure to covid-19: Secondary | ICD-10-CM | POA: Diagnosis present

## 2019-06-04 DIAGNOSIS — S0181XA Laceration without foreign body of other part of head, initial encounter: Secondary | ICD-10-CM

## 2019-06-04 DIAGNOSIS — I10 Essential (primary) hypertension: Secondary | ICD-10-CM | POA: Diagnosis present

## 2019-06-04 DIAGNOSIS — T502X5A Adverse effect of carbonic-anhydrase inhibitors, benzothiadiazides and other diuretics, initial encounter: Secondary | ICD-10-CM | POA: Diagnosis present

## 2019-06-04 DIAGNOSIS — W2209XA Striking against other stationary object, initial encounter: Secondary | ICD-10-CM | POA: Diagnosis present

## 2019-06-04 DIAGNOSIS — E785 Hyperlipidemia, unspecified: Secondary | ICD-10-CM | POA: Diagnosis present

## 2019-06-04 DIAGNOSIS — Z800 Family history of malignant neoplasm of digestive organs: Secondary | ICD-10-CM | POA: Diagnosis not present

## 2019-06-04 DIAGNOSIS — R58 Hemorrhage, not elsewhere classified: Secondary | ICD-10-CM | POA: Diagnosis not present

## 2019-06-04 DIAGNOSIS — S0003XA Contusion of scalp, initial encounter: Secondary | ICD-10-CM | POA: Diagnosis not present

## 2019-06-04 DIAGNOSIS — I951 Orthostatic hypotension: Secondary | ICD-10-CM | POA: Diagnosis present

## 2019-06-04 DIAGNOSIS — M7989 Other specified soft tissue disorders: Secondary | ICD-10-CM | POA: Diagnosis present

## 2019-06-04 DIAGNOSIS — K921 Melena: Secondary | ICD-10-CM | POA: Diagnosis present

## 2019-06-04 DIAGNOSIS — I2510 Atherosclerotic heart disease of native coronary artery without angina pectoris: Secondary | ICD-10-CM | POA: Diagnosis present

## 2019-06-04 DIAGNOSIS — I959 Hypotension, unspecified: Secondary | ICD-10-CM | POA: Diagnosis not present

## 2019-06-04 DIAGNOSIS — W19XXXA Unspecified fall, initial encounter: Secondary | ICD-10-CM | POA: Diagnosis present

## 2019-06-04 DIAGNOSIS — R531 Weakness: Secondary | ICD-10-CM | POA: Diagnosis not present

## 2019-06-04 DIAGNOSIS — Z8249 Family history of ischemic heart disease and other diseases of the circulatory system: Secondary | ICD-10-CM | POA: Diagnosis not present

## 2019-06-04 DIAGNOSIS — K644 Residual hemorrhoidal skin tags: Secondary | ICD-10-CM | POA: Diagnosis present

## 2019-06-04 DIAGNOSIS — Z7982 Long term (current) use of aspirin: Secondary | ICD-10-CM

## 2019-06-04 DIAGNOSIS — T50915A Adverse effect of multiple unspecified drugs, medicaments and biological substances, initial encounter: Secondary | ICD-10-CM | POA: Diagnosis present

## 2019-06-04 DIAGNOSIS — R42 Dizziness and giddiness: Secondary | ICD-10-CM | POA: Diagnosis not present

## 2019-06-04 DIAGNOSIS — S0993XA Unspecified injury of face, initial encounter: Secondary | ICD-10-CM | POA: Diagnosis not present

## 2019-06-04 DIAGNOSIS — R220 Localized swelling, mass and lump, head: Secondary | ICD-10-CM | POA: Diagnosis not present

## 2019-06-04 LAB — TROPONIN I (HIGH SENSITIVITY)
Troponin I (High Sensitivity): 5 ng/L (ref <18)
Troponin I (High Sensitivity): 5 ng/L (ref <18)

## 2019-06-04 LAB — COMPREHENSIVE METABOLIC PANEL
ALT: 17 U/L (ref 0–44)
AST: 22 U/L (ref 15–41)
Albumin: 3.1 g/dL — ABNORMAL LOW (ref 3.5–5.0)
Alkaline Phosphatase: 49 U/L (ref 38–126)
Anion gap: 8 (ref 5–15)
BUN: 8 mg/dL (ref 8–23)
CO2: 24 mmol/L (ref 22–32)
Calcium: 8.9 mg/dL (ref 8.9–10.3)
Chloride: 91 mmol/L — ABNORMAL LOW (ref 98–111)
Creatinine, Ser: 0.92 mg/dL (ref 0.44–1.00)
GFR calc Af Amer: >60 mL/min (ref >60)
GFR calc non Af Amer: 60 mL/min — ABNORMAL LOW (ref >60)
Glucose, Bld: 139 mg/dL — ABNORMAL HIGH (ref 70–99)
Potassium: 3.7 mmol/L (ref 3.5–5.1)
Sodium: 123 mmol/L — ABNORMAL LOW (ref 135–145)
Total Bilirubin: 1 mg/dL (ref 0.3–1.2)
Total Protein: 5.5 g/dL — ABNORMAL LOW (ref 6.5–8.1)

## 2019-06-04 LAB — CBC
HCT: 38.4 % (ref 36.0–46.0)
Hemoglobin: 13.5 g/dL (ref 12.0–15.0)
MCH: 29.7 pg (ref 26.0–34.0)
MCHC: 35.2 g/dL (ref 30.0–36.0)
MCV: 84.6 fL (ref 80.0–100.0)
Platelets: 200 10*3/uL (ref 150–400)
RBC: 4.54 MIL/uL (ref 3.87–5.11)
RDW: 12.5 % (ref 11.5–15.5)
WBC: 6.3 10*3/uL (ref 4.0–10.5)
nRBC: 0 % (ref 0.0–0.2)

## 2019-06-04 LAB — MAGNESIUM: Magnesium: 1.8 mg/dL (ref 1.7–2.4)

## 2019-06-04 LAB — URINALYSIS, ROUTINE W REFLEX MICROSCOPIC
Bilirubin Urine: NEGATIVE
Glucose, UA: NEGATIVE mg/dL
Ketones, ur: NEGATIVE mg/dL
Nitrite: NEGATIVE
Protein, ur: NEGATIVE mg/dL
Specific Gravity, Urine: 1.015 (ref 1.005–1.030)
pH: 7 (ref 5.0–8.0)

## 2019-06-04 LAB — BRAIN NATRIURETIC PEPTIDE: B Natriuretic Peptide: 34.2 pg/mL (ref 0.0–100.0)

## 2019-06-04 LAB — URINALYSIS, MICROSCOPIC (REFLEX)

## 2019-06-04 LAB — PHOSPHORUS: Phosphorus: 2.8 mg/dL (ref 2.5–4.6)

## 2019-06-04 LAB — TSH: TSH: 1.459 u[IU]/mL (ref 0.350–4.500)

## 2019-06-04 LAB — SARS CORONAVIRUS 2 (TAT 6-24 HRS): SARS Coronavirus 2: NEGATIVE

## 2019-06-04 MED ORDER — CALCIUM CARBONATE ANTACID 500 MG PO CHEW
1.0000 | CHEWABLE_TABLET | Freq: Every day | ORAL | Status: DC
  Administered 2019-06-04 – 2019-06-07 (×3): 200 mg via ORAL
  Filled 2019-06-04 (×4): qty 1

## 2019-06-04 MED ORDER — ACETAMINOPHEN 650 MG RE SUPP: 650.0000 mg | Freq: Four times a day (QID) | RECTAL | Status: DC | PRN

## 2019-06-04 MED ORDER — ONDANSETRON HCL 4 MG/2ML IJ SOLN
4.0000 mg | Freq: Four times a day (QID) | INTRAMUSCULAR | Status: DC | PRN
  Administered 2019-06-04: 21:00:00 4 mg via INTRAVENOUS
  Filled 2019-06-04: qty 2

## 2019-06-04 MED ORDER — LIDOCAINE-EPINEPHRINE 1 %-1:100000 IJ SOLN
10.0000 mL | Freq: Once | INTRAMUSCULAR | Status: AC
  Administered 2019-06-04: 12:00:00 10 mL via INTRADERMAL
  Filled 2019-06-04: qty 10

## 2019-06-04 MED ORDER — ACETAMINOPHEN 325 MG PO TABS
650.0000 mg | ORAL_TABLET | Freq: Four times a day (QID) | ORAL | Status: DC | PRN
  Filled 2019-06-04: qty 2

## 2019-06-04 MED ORDER — FAMOTIDINE IN NACL 20-0.9 MG/50ML-% IV SOLN
20.0000 mg | Freq: Once | INTRAVENOUS | Status: AC
  Administered 2019-06-04: 20 mg via INTRAVENOUS
  Filled 2019-06-04 (×2): qty 50

## 2019-06-04 MED ORDER — ACETAMINOPHEN 160 MG/5ML PO SOLN
650.0000 mg | Freq: Four times a day (QID) | ORAL | Status: DC | PRN
  Administered 2019-06-04 – 2019-06-05 (×3): 650 mg via ORAL
  Filled 2019-06-04 (×3): qty 20.3

## 2019-06-04 MED ORDER — ONDANSETRON HCL 4 MG PO TABS: 4.0000 mg | ORAL_TABLET | Freq: Four times a day (QID) | ORAL | Status: DC | PRN

## 2019-06-04 MED ORDER — HYDROCORTISONE ACETATE 25 MG RE SUPP
25.0000 mg | Freq: Two times a day (BID) | RECTAL | Status: DC | PRN
  Administered 2019-06-05 – 2019-06-08 (×4): 25 mg via RECTAL
  Filled 2019-06-04 (×4): qty 1

## 2019-06-04 MED ORDER — SODIUM CHLORIDE 0.9 % IV SOLN: INTRAVENOUS | Status: DC

## 2019-06-04 MED ORDER — LIDOCAINE-EPINEPHRINE-TETRACAINE (LET) TOPICAL GEL
3.0000 mL | Freq: Once | TOPICAL | Status: AC
  Administered 2019-06-04: 3 mL via TOPICAL
  Filled 2019-06-04: qty 3

## 2019-06-04 NOTE — ED Provider Notes (Addendum)
[facility] EMERGENCY DEPARTMENT Provider Note   CSN: [id number] Arrival date & time: 06/04/19  [id number]     History Chief Complaint  Patient presents with  . Loss of Consciousness    Tina Ware is a 79 y.o. female with PMH significant for hypertension, prediabetes, hypercholesterolemia, rheumatoid arthritis who presented to ED via EMS after fall at home due to syncopal episode. Patient reports she felt lightheaded and dizzy while brushing her teeth, and then fell and hit her head on the counter top, fell to the ground and then got back up. Once she got back up, she then fell again, hitting her head against the door causing a large cut above her eye. She reports that she started taking labetalol this morning for the first time after Dr. Nadyne Coombes took her off her coreg and started labetalol to help more with her blood pressure last week. She denies and chest pain, tachycardia or shortness of breath prior to the fall. She reports she took her labetalol at 7:30am this morning. She is on aspirin 81mg  daily and took that this morning. No other blood thinners.   Patient reports that she has also been quite sleepy recently. She reports getting up 4-5 times per night to go to the bathroom and Dr. Nadyne Coombes thought that may be causing her fatigue. He has discussed the option of a sleep study with her, but decided to wait.     Past Medical History:  Diagnosis Date  . Arthritis    RA  . CAD (coronary artery disease)   . Fibroid, uterine   . Hypertension     Patient Active Problem List   Diagnosis Date Noted  . Status post trigger finger release 01/24/2019  . Carpal tunnel syndrome, right upper limb 12/27/2018  . Localized edema 02/12/2018  . Essential hypertension 02/12/2018  . Chronic pain of right knee 02/12/2018  . Abdominal pain 05/03/2017    Past Surgical History:  Procedure Laterality Date  . CARPAL TUNNEL RELEASE Right 12/20/2018  . CATARACT EXTRACTION, BILATERAL      . CHOLECYSTECTOMY    . TRIGGER FINGER RELEASE Right 12/20/2018     OB History   No obstetric history on file.     Family History  Problem Relation Age of Onset  . Heart disease Mother   . Dementia Mother   . Congestive Heart Failure Mother   . Parkinson's disease Father   . Pancreatic cancer Sister     Social History   Tobacco Use  . Smoking status: Never Smoker  . Smokeless tobacco: Never Used  Substance Use Topics  . Alcohol use: No    Alcohol/week: 0.0 standard drinks  . Drug use: No    Home Medications Prior to Admission medications   Medication Sig Start Date End Date Taking? Authorizing Provider  aspirin EC 81 MG tablet Take 81 mg by mouth daily.    [provider]  benzonatate (TESSALON) 100 MG capsule TAKE ONE CAPSULE BY MOUTH 3 TIMES A DAY AS NEEDED FOR COUGH 07/05/18   Glendale Chard, MD  cholecalciferol (VITAMIN D) 1000 units tablet Take 1,000 Units by mouth daily.    [provider]  HYDROcodone-acetaminophen (NORCO/VICODIN) 5-325 MG tablet Take 1 tablet by mouth every 6 (six) hours as needed for moderate pain. 12/20/18   Garald Balding, MD  hydrocortisone (ANUSOL-HC) 25 MG suppository Place 1 suppository (25 mg total) rectally 2 (two) times daily as needed for hemorrhoids or anal itching. 01/09/19 01/09/20  Glendale Chard, MD  labetalol (NORMODYNE) 200 MG tablet Take 1 tablet (200 mg total) by mouth 2 (two) times daily. 05/29/19   Adrian Prows, MD  losartan (COZAAR) 100 MG tablet Take 1 tablet (100 mg total) by mouth daily. Patient taking differently: Take 50 mg by mouth 2 (two) times daily.  02/07/19 02/07/20  Glendale Chard, MD  Magnesium 250 MG TABS Take 1 tablet by mouth with evening meal Patient taking differently: Liquid form 01/23/19   Minette Brine, FNP  mometasone (NASONEX) 50 MCG/ACT nasal spray PLACE 2 SPRAYS INTO THE NOSE DAILY AS NEEDED (ALLERGIES). 05/14/19   Glendale Chard, MD  Multiple Vitamin (MULTIVITAMIN WITH MINERALS) TABS  tablet Take 1 tablet by mouth daily.    [provider]  Omega-3 1000 MG CAPS Take 1,000 mg by mouth daily.    [provider]  spironolactone-hydrochlorothiazide (ALDACTAZIDE) 25-25 MG tablet Take 1 tablet by mouth daily. 04/12/19   Adrian Prows, MD    Allergies    Patient has no known allergies.  Review of Systems   Review of Systems  Constitutional: Negative for chills and fever.  Eyes: Negative for visual disturbance.  Respiratory: Negative for chest tightness and shortness of breath.   Cardiovascular: Positive for leg swelling. Negative for chest pain and palpitations.  Gastrointestinal: Negative for abdominal pain, blood in stool, constipation, diarrhea, nausea and vomiting.  Endocrine: Positive for polyuria.  Genitourinary: Negative for dysuria and hematuria.  Musculoskeletal: Negative for neck pain and neck stiffness.  Neurological: Positive for dizziness, syncope, weakness and light-headedness. Negative for headaches.    Physical Exam Updated Vital Signs BP (!) 124/57   Pulse 62   Resp 17   SpO2 99%   Physical Exam Vitals and nursing note reviewed. Exam conducted with a chaperone present.  Constitutional:      General: She is not in acute distress.    Appearance: Normal appearance. She is normal weight.  HENT:     Head: Normocephalic.     Comments: Open 2 cm laceration above L eye on forehead     Nose: Nose normal.     Mouth/Throat:     Mouth: Mucous membranes are moist.     Pharynx: Oropharynx is clear.  Eyes:     Extraocular Movements: Extraocular movements intact.     Conjunctiva/sclera: Conjunctivae normal.     Pupils: Pupils are equal, round, and reactive to light.  Cardiovascular:     Rate and Rhythm: Normal rate and regular rhythm.     Heart sounds: Normal heart sounds.  Pulmonary:     Effort: Pulmonary effort is normal. No respiratory distress.     Breath sounds: Normal breath sounds. No wheezing.  Abdominal:     General: Abdomen is  flat. There is no distension.     Palpations: Abdomen is soft.  Musculoskeletal:     Cervical back: Normal range of motion and neck supple. No rigidity or tenderness.  Skin:    General: Skin is warm.  Neurological:     Mental Status: She is alert.     ED Results / Procedures / Treatments   Labs (all labs ordered are listed, but only abnormal results are displayed) Labs Reviewed - No data to display  EKG EKG Interpretation  Date/Time:  Tuesday June 04 2019 09:59:00 EST Ventricular Rate:  60 PR Interval:    QRS Duration: 96 QT Interval:  428 QTC Calculation: 428 R Axis:   -20 Text Interpretation: Sinus rhythm Borderline prolonged PR interval Borderline left  axis deviation Low voltage, precordial leads Abnormal R-wave progression, early transition rate slower otherwise similar to previous Confirmed by Theotis Burrow [phone]) on 06/04/2019 10:04:20 AM   Radiology No results found.  Procedures .Marland KitchenLaceration Repair  Date/Time: 06/04/2019 1:25 PM Performed by: Tahisha Hakim, [place], DO Authorized by: Sharlett Iles, MD   Consent:    Consent obtained:  Verbal   Consent given by:  Patient   Risks discussed:  Infection, pain and poor cosmetic result   Alternatives discussed:  No treatment Anesthesia (see MAR for exact dosages):    Anesthesia method:  Topical application and local infiltration   Topical anesthetic:  LET   Local anesthetic:  Lidocaine 1% WITH epi Laceration details:    Location:  Face   Face location:  Forehead   Length (cm):  2 Repair type:    Repair type:  Simple Pre-procedure details:    Preparation:  Patient was prepped and draped in usual sterile fashion Exploration:    Hemostasis achieved with:  Direct pressure and LET   Wound exploration: entire depth of wound probed and visualized     Wound extent: no nerve damage noted and no vascular damage noted     Contaminated: no   Treatment:    Area cleansed with:  Saline and Betadine   Amount of  cleaning:  Standard   Irrigation solution:  Sterile saline   Irrigation volume:  36mL   Irrigation method:  Syringe   Visualized foreign bodies/material removed: no   Skin repair:    Repair method:  Sutures   Suture size:  6-0   Suture material:  Nylon   Suture technique:  Simple interrupted   Number of sutures:  5 Approximation:    Approximation:  Close Post-procedure details:    Dressing:  Antibiotic ointment   Patient tolerance of procedure:  Tolerated well, no immediate complications Comments:     3, 5-0 vicryl sutures placed under skin to approximate galea   (including critical care time)  Medications Ordered in ED Medications - No data to display  ED Course  I have reviewed the triage vital signs and the nursing notes.  Pertinent labs & imaging results that were available during my care of the patient were reviewed by me and considered in my medical decision making (see chart for details).    MDM Rules/Calculators/A&P                      79yo female here after syncopal event where patient experienced full LOC with prodrome of lightheadedness and dizziness in setting of new beta-blocker. Head CT without obtained in the ED with no concern for bleed.  Patient denies any neck pain and denies hitting her neck so no CT neck obtained. EKG in the ED grossly unchanged from previous. Patient with no signs of infection and VSS.  In the ED, patient is alert and oriented and able to answer all questions from history but is often falling asleep during the exam, however is easily awoken. Will obtain CMP, CBC, TSH and UA to rule out other causes.   Patient found to be hyponatremic to 123.  Baseline appears to be in low 130s, and s/p 567mL of NS with EMS. TSH, CBC and UA WNL. Given that patient had a syncopal episode earlier today and is quite somnolent on exam, will likely need to admit for observation.  Hyponatremia could be due to undiagnosed CHF (patient was to have echocardiogram done  today with her cardiologist)  vs HCTZ use vs dietary given concern that patient is drinking too much prior to going to bed at night. Called hospitalist team for admission, and spoke with accepting team at 1:35pm.  Tina Ware was evaluated in Emergency Department on 06/04/2019 for the symptoms described in the history of present illness. She was evaluated in the context of the global COVID-19 pandemic, which necessitated consideration that the patient might be at risk for infection with the SARS-CoV-2 virus that causes COVID-19. Institutional protocols and algorithms that pertain to the evaluation of patients at risk for COVID-19 are in a state of rapid change based on information released by regulatory bodies including the CDC and federal and state organizations. These policies and algorithms were followed during the patient's care in the ED. Final Clinical Impression(s) / ED Diagnoses Final diagnoses:  None    Rx / DC Orders ED Discharge Orders    None       Clema Skousen, [place], DO 06/04/19 [place], [place], DO 06/04/19 [place], [place], DO 06/04/19 1402    Little, Wenda Overland, MD 06/05/19 1120

## 2019-06-04 NOTE — H&P (Addendum)
History and Physical    Tina Ware [id number] DOB: May 09, 1940 DOA: 06/04/2019  PCP: Glendale Chard, MD  Patient coming from: Home  I have personally briefly reviewed patient's old medical records in [place]  Chief Complaint: Syncope  HPI: Tina Ware is a 79 y.o. female with medical history significant of hypertension, hyperlipidemia, chronic bilateral leg swelling presents to emergency department via EMS after fall at home due to syncopal episode.  Patient tells me that she started taking labetalol this morning for the first time after Dr. Nadyne Coombes took her off her Coreg and started labetalol to help more with her blood pressure last week.  Tells me that after taking labetalol she felt lightheaded, dizzy while brushing her teeth and then she fell and hit her head on the countertop and fell to the ground.  Patient tells me that she passed out for about 1 minute and then she called 911 and came to the emergency department for further evaluation and management.  She denies previous history of similar symptoms.  No seizure, fever, chills, chest pain, shortness of breath, palpitation, abdominal pain, urinary or bowel changes.  She tells me that she was scheduled for transthoracic echo this morning however she missed appointment due to her syncope.  She tells me that she has hemorrhoids and sometimes she noticed blood in her stool.  She was prescribed Anusol suppository however due to rotator cuff she is unable to use suppository.  She lives with her son.  Independent on daily life activities.  No history of smoking, alcohol, street drug use.  ED Course: Upon arrival: Patient's blood pressure soft, BMP shows sodium of 123, patient is afebrile with no leukocytosis.  TSH: WNL, urine COVID-19 pending.  CT head without contrast shows left frontal scalp hematoma.    Review of Systems: As per HPI otherwise negative.    Past Medical History:  Diagnosis Date  . Arthritis    RA  . CAD (coronary artery disease)   . Fibroid, uterine   . Hypertension     Past Surgical History:  Procedure Laterality Date  . CARPAL TUNNEL RELEASE Right 12/20/2018  . CATARACT EXTRACTION, BILATERAL    . CHOLECYSTECTOMY    . TRIGGER FINGER RELEASE Right 12/20/2018     reports that she has never smoked. She has never used smokeless tobacco. She reports that she does not drink alcohol or use drugs.  No Known Allergies  Family History  Problem Relation Age of Onset  . Heart disease Mother   . Dementia Mother   . Congestive Heart Failure Mother   . Parkinson's disease Father   . Pancreatic cancer Sister     Prior to Admission medications   Medication Sig Start Date End Date Taking? Authorizing Provider  aspirin EC 81 MG tablet Take 81 mg by mouth daily.   Yes [provider]  Cholecalciferol (VITAMIN D) 50 MCG (2000 UT) tablet Take 2,000 Units by mouth daily.    Yes [provider]  labetalol (NORMODYNE) 200 MG tablet Take 1 tablet (200 mg total) by mouth 2 (two) times daily. 05/29/19  Yes Adrian Prows, MD  losartan (COZAAR) 50 MG tablet Take 50 mg by mouth in the morning and at bedtime.   Yes [provider]  mometasone (NASONEX) 50 MCG/ACT nasal spray PLACE 2 SPRAYS INTO THE NOSE DAILY AS NEEDED (ALLERGIES). 05/14/19  Yes Glendale Chard, MD  spironolactone-hydrochlorothiazide (ALDACTAZIDE) 25-25 MG tablet Take 1 tablet by mouth daily. 04/12/19  Yes [place],  Ulice Dash, MD  benzonatate (TESSALON) 100 MG capsule TAKE ONE CAPSULE BY MOUTH 3 TIMES A DAY AS NEEDED FOR COUGH Patient not taking: Reported on 06/04/2019 07/05/18   Glendale Chard, MD  HYDROcodone-acetaminophen (NORCO/VICODIN) 5-325 MG tablet Take 1 tablet by mouth every 6 (six) hours as needed for moderate pain. Patient not taking: Reported on 06/04/2019 12/20/18   Garald Balding, MD  hydrocortisone (ANUSOL-HC) 25 MG suppository Place 1 suppository (25 mg total) rectally 2 (two) times daily as needed for  hemorrhoids or anal itching. Patient not taking: Reported on 06/04/2019 01/09/19 01/09/20  Glendale Chard, MD  losartan (COZAAR) 100 MG tablet Take 1 tablet (100 mg total) by mouth daily. Patient not taking: Reported on 06/04/2019 02/07/19 02/07/20  Glendale Chard, MD  Magnesium 250 MG TABS Take 1 tablet by mouth with evening meal Patient not taking: Reported on 06/04/2019 01/23/19   Minette Brine, FNP    Physical Exam: Vitals:   06/04/19 1455 06/04/19 1500 06/04/19 1524 06/04/19 1525  BP:   (!) 153/63 (!) 153/63  Pulse: 69 71 72 77  Resp: 13 14 16 16   Temp:   97.8 F (36.6 C) 97.8 F (36.6 C)  TempSrc:   Oral Oral  SpO2: 100% 100% 100% 100%  Weight:    74.5 kg  Height:   5\' 2"  (1.575 m)     Constitutional: NAD, calm, comfortable, communicating well. Eyes: PERRL, lids and conjunctivae normal ENMT: Mucous membranes are moist. Posterior pharynx clear of any exudate or lesions.Normal dentition.  Neck: normal, supple, no masses, no thyromegaly Respiratory: clear to auscultation bilaterally, no wheezing, no crackles. Normal respiratory effort. No accessory muscle use.  Cardiovascular: Regular rate and rhythm, no murmurs / rubs / gallops.  Bilateral nonpitting leg edema positive. 2+ pedal pulses. No carotid bruits.  Abdomen: no tenderness, no masses palpated. No hepatosplenomegaly. Bowel sounds positive.  Musculoskeletal: no clubbing / cyanosis. No joint deformity upper and lower extremities. Good ROM, no contractures. Normal muscle tone.  Skin: Sutures noted on left frontal head.   Neurologic: CN 2-12 grossly intact. Sensation intact, DTR normal. Strength 5/5 in all 4.  Psychiatric: Normal judgment and insight. Alert and oriented x 3. Normal mood.    Labs on Admission: I have personally reviewed following labs and imaging studies  CBC: Recent Labs  Lab 06/04/19 1051  WBC 6.3  HGB 13.5  HCT 38.4  MCV 84.6  PLT [id number]   Basic Metabolic Panel: Recent Labs  Lab 06/04/19 1051  NA  123*  K 3.7  CL 91*  CO2 24  GLUCOSE 139*  BUN 8  CREATININE 0.92  CALCIUM 8.9  MG 1.8  PHOS 2.8   GFR: Estimated Creatinine Clearance: 47.7 mL/min (by C-G formula based on SCr of 0.92 mg/dL). Liver Function Tests: Recent Labs  Lab 06/04/19 1051  AST 22  ALT 17  ALKPHOS 49  BILITOT 1.0  PROT 5.5*  ALBUMIN 3.1*   No results for input(s): LIPASE, AMYLASE in the last 168 hours. No results for input(s): AMMONIA in the last 168 hours. Coagulation Profile: No results for input(s): INR, PROTIME in the last 168 hours. Cardiac Enzymes: No results for input(s): CKTOTAL, CKMB, CKMBINDEX, TROPONINI in the last 168 hours. BNP (last 3 results) No results for input(s): PROBNP in the last 8760 hours. HbA1C: No results for input(s): HGBA1C in the last 72 hours. CBG: No results for input(s): GLUCAP in the last 168 hours. Lipid Profile: No results for input(s): CHOL, HDL, LDLCALC, TRIG, CHOLHDL, LDLDIRECT  in the last 72 hours. Thyroid Function Tests: Recent Labs    06/04/19 1051  TSH 1.459   Anemia Panel: No results for input(s): VITAMINB12, FOLATE, FERRITIN, TIBC, IRON, RETICCTPCT in the last 72 hours. Urine analysis:    Component Value Date/Time   COLORURINE YELLOW 06/04/2019 1332   APPEARANCEUR CLOUDY (A) 06/04/2019 1332   LABSPEC 1.015 06/04/2019 1332   PHURINE 7.0 06/04/2019 1332   GLUCOSEU NEGATIVE 06/04/2019 1332   HGBUR MODERATE (A) 06/04/2019 1332   BILIRUBINUR NEGATIVE 06/04/2019 1332   BILIRUBINUR negative 10/15/2018 1345   KETONESUR NEGATIVE 06/04/2019 1332   PROTEINUR NEGATIVE 06/04/2019 1332   UROBILINOGEN 0.2 10/15/2018 1345   NITRITE NEGATIVE 06/04/2019 1332   LEUKOCYTESUR SMALL (A) 06/04/2019 1332    Radiological Exams on Admission: CT Head Wo Contrast  Result Date: 06/04/2019 CLINICAL DATA:  Pain following fall EXAM: CT HEAD WITHOUT CONTRAST TECHNIQUE: Contiguous axial images were obtained from the base of the skull through the vertex without  intravenous contrast. COMPARISON:  None. FINDINGS: Brain: Ventricles and sulci are within normal limits for age. There is no intracranial mass, hemorrhage, extra-axial fluid collection, or midline shift. Brain parenchyma appears unremarkable. No evident acute infarct. Vascular: No hyperdense vessel. There is calcification in each carotid siphon region. Skull: The bony calvarium appears intact. Soft tissue air and laceration noted in the left frontal scalp region. Sinuses/Orbits: There is mucosal thickening in each inferior maxillary antrum. There is mucosal thickening in multiple ethmoid air cells as well as in the anterior aspect of each sphenoid sinus. Orbits appear symmetric bilaterally. Note that there is apparent hematoma anteriorly on the right, inferior to the orbit. Other: Mastoid air cells are clear. IMPRESSION: 1. Soft tissue laceration and air in the left frontal scalp region. No bony abnormality. 2. Apparent soft tissue hematoma anteriorly on the right, inferior to the right orbit at the level of the right inferior maxilla. 3.  Brain parenchyma appears unremarkable.  No mass or hemorrhage. 4.  Foci of arterial vascular calcification. 5.  Multifocal paranasal sinus disease. Electronically Signed   By: Lowella Grip III M.D.   On: 06/04/2019 11:49    EKG: Sinus rhythm, Borderline prolonged PR interval, Borderline left axis deviation.  No ST elevation or depression noted.  Assessment/Plan Principal Problem:   Syncope Active Problems:   Hyponatremia   Bilateral leg edema   Essential hypertension   Scalp hematoma   Blood in stool   Syncope: -Likely secondary to orthostatic hypotension due to polypharmacy.  She takes labetalol, losartan, spironolactone-HCTZ at home for blood pressure. -CT head without contrast came back negative for stroke. -Admit patient under observation.  On telemetry.  Reviewed EKG. check orthostatic vitals. -Patient is afebrile with no leukocytosis.  UA and COVID-19  pending. -Patient received IV fluids in ED will continue same. -Hold blood pressure medicine at this time as patient's blood pressure is on lower side.  Monitor vitals closely. -We will keep her n.p.o. until she passes a bedside swallow evaluation. -Consulted PT/ST/OT -We will get transthoracic echo  Hyponatremia: Sodium: 123 -Likely secondary to HCTZ  -Hold antihypertensive medicines for now.  Continue IV fluids.  Repeat BMP tomorrow a.m.  Hypertension: Blood pressure is soft. -Hold antihypertensive for now.  Scalp hematoma: Left-sided due to fall -Sutures applied in ED. -Monitor H&H.  Tylenol as needed for pain control.  Blood in stool: Likely secondary to external hemorrhoids -H&H is stable.  No history of NSAID use. -Continue Anusol  Bilateral chronic leg edema: -TSH: WNL.  Check BNP -We will get transthoracic echo.  Patient denies orthopnea, PND, shortness of breath. -Compression stockings.  Elevate legs.  DVT prophylaxis: TED/SCD Code Status: Full code Family Communication: None present at bedside.  Plan of care discussed with patient in length and he verbalized understanding and agreed with it. Disposition Plan: Likely home tomorrow  consults called: None  admission status: Observation  Mckinley Jewel MD [facility] Pager [phone]  If 7PM-7AM, please contact night-coverage [email or web address] Password [facility]  06/04/2019, 4:33 PM

## 2019-06-04 NOTE — ED Triage Notes (Addendum)
Pt reports she has been tired/sleepy lately. She has received both doses of her covid vaccine. Denies any pain anywhere, dysuria, diarrhea, N V. Pt states she started her new medication today, labetalol, and 15 minutes later had a syncopal episode and fell and hit head on the door. Pt is lethargic during triage. Laceration to left forehead that is actively bleeding. Pt takes Aspirin. Is alert and oriented once aroused. CBG 156. Initial BP [id number] systolic w EMs. Given 536ml bolus. Pt denies fevers

## 2019-06-04 NOTE — Progress Notes (Signed)
  Echocardiogram 2D Echocardiogram has been performed.  Tina Ware 06/04/2019, 4:17 PM

## 2019-06-04 NOTE — Plan of Care (Signed)

## 2019-06-04 NOTE — ED Notes (Signed)
Report given.

## 2019-06-04 NOTE — ED Notes (Addendum)
Urine culture collected with uninalysis

## 2019-06-05 ENCOUNTER — Observation Stay (HOSPITAL_COMMUNITY): Payer: Medicare PPO

## 2019-06-05 DIAGNOSIS — M069 Rheumatoid arthritis, unspecified: Secondary | ICD-10-CM | POA: Diagnosis present

## 2019-06-05 DIAGNOSIS — K921 Melena: Secondary | ICD-10-CM | POA: Diagnosis present

## 2019-06-05 DIAGNOSIS — S0101XA Laceration without foreign body of scalp, initial encounter: Secondary | ICD-10-CM | POA: Diagnosis present

## 2019-06-05 DIAGNOSIS — E785 Hyperlipidemia, unspecified: Secondary | ICD-10-CM | POA: Diagnosis present

## 2019-06-05 DIAGNOSIS — Z820 Family history of epilepsy and other diseases of the nervous system: Secondary | ICD-10-CM | POA: Diagnosis not present

## 2019-06-05 DIAGNOSIS — Z800 Family history of malignant neoplasm of digestive organs: Secondary | ICD-10-CM | POA: Diagnosis not present

## 2019-06-05 DIAGNOSIS — R55 Syncope and collapse: Secondary | ICD-10-CM | POA: Diagnosis present

## 2019-06-05 DIAGNOSIS — R220 Localized swelling, mass and lump, head: Secondary | ICD-10-CM | POA: Diagnosis not present

## 2019-06-05 DIAGNOSIS — T50915A Adverse effect of multiple unspecified drugs, medicaments and biological substances, initial encounter: Secondary | ICD-10-CM | POA: Diagnosis present

## 2019-06-05 DIAGNOSIS — D259 Leiomyoma of uterus, unspecified: Secondary | ICD-10-CM | POA: Diagnosis present

## 2019-06-05 DIAGNOSIS — M7989 Other specified soft tissue disorders: Secondary | ICD-10-CM | POA: Diagnosis present

## 2019-06-05 DIAGNOSIS — W2209XA Striking against other stationary object, initial encounter: Secondary | ICD-10-CM | POA: Diagnosis present

## 2019-06-05 DIAGNOSIS — R600 Localized edema: Secondary | ICD-10-CM | POA: Diagnosis not present

## 2019-06-05 DIAGNOSIS — Z20822 Contact with and (suspected) exposure to covid-19: Secondary | ICD-10-CM | POA: Diagnosis present

## 2019-06-05 DIAGNOSIS — K644 Residual hemorrhoidal skin tags: Secondary | ICD-10-CM | POA: Diagnosis present

## 2019-06-05 DIAGNOSIS — I10 Essential (primary) hypertension: Secondary | ICD-10-CM | POA: Diagnosis present

## 2019-06-05 DIAGNOSIS — I2510 Atherosclerotic heart disease of native coronary artery without angina pectoris: Secondary | ICD-10-CM | POA: Diagnosis present

## 2019-06-05 DIAGNOSIS — S0993XA Unspecified injury of face, initial encounter: Secondary | ICD-10-CM

## 2019-06-05 DIAGNOSIS — T502X5A Adverse effect of carbonic-anhydrase inhibitors, benzothiadiazides and other diuretics, initial encounter: Secondary | ICD-10-CM | POA: Diagnosis present

## 2019-06-05 DIAGNOSIS — Z8249 Family history of ischemic heart disease and other diseases of the circulatory system: Secondary | ICD-10-CM | POA: Diagnosis not present

## 2019-06-05 DIAGNOSIS — E871 Hypo-osmolality and hyponatremia: Secondary | ICD-10-CM | POA: Diagnosis present

## 2019-06-05 DIAGNOSIS — I951 Orthostatic hypotension: Secondary | ICD-10-CM | POA: Diagnosis present

## 2019-06-05 DIAGNOSIS — Y92009 Unspecified place in unspecified non-institutional (private) residence as the place of occurrence of the external cause: Secondary | ICD-10-CM | POA: Diagnosis not present

## 2019-06-05 DIAGNOSIS — S0181XA Laceration without foreign body of other part of head, initial encounter: Secondary | ICD-10-CM | POA: Diagnosis not present

## 2019-06-05 DIAGNOSIS — Z7982 Long term (current) use of aspirin: Secondary | ICD-10-CM | POA: Diagnosis not present

## 2019-06-05 DIAGNOSIS — W19XXXA Unspecified fall, initial encounter: Secondary | ICD-10-CM | POA: Diagnosis present

## 2019-06-05 LAB — CBC
HCT: 35.9 % — ABNORMAL LOW (ref 36.0–46.0)
Hemoglobin: 13.2 g/dL (ref 12.0–15.0)
MCH: 30.1 pg (ref 26.0–34.0)
MCHC: 36.8 g/dL — ABNORMAL HIGH (ref 30.0–36.0)
MCV: 81.8 fL (ref 80.0–100.0)
Platelets: 197 10*3/uL (ref 150–400)
RBC: 4.39 MIL/uL (ref 3.87–5.11)
RDW: 12.2 % (ref 11.5–15.5)
WBC: 5.6 10*3/uL (ref 4.0–10.5)
nRBC: 0 % (ref 0.0–0.2)

## 2019-06-05 LAB — BASIC METABOLIC PANEL
Anion gap: 8 (ref 5–15)
BUN: 5 mg/dL — ABNORMAL LOW (ref 8–23)
CO2: 24 mmol/L (ref 22–32)
Calcium: 9 mg/dL (ref 8.9–10.3)
Chloride: 92 mmol/L — ABNORMAL LOW (ref 98–111)
Creatinine, Ser: 0.86 mg/dL (ref 0.44–1.00)
GFR calc Af Amer: >60 mL/min (ref >60)
GFR calc non Af Amer: >60 mL/min (ref >60)
Glucose, Bld: 134 mg/dL — ABNORMAL HIGH (ref 70–99)
Potassium: 3.7 mmol/L (ref 3.5–5.1)
Sodium: 124 mmol/L — ABNORMAL LOW (ref 135–145)

## 2019-06-05 LAB — COMPREHENSIVE METABOLIC PANEL
ALT: 15 U/L (ref 0–44)
AST: 21 U/L (ref 15–41)
Albumin: 2.7 g/dL — ABNORMAL LOW (ref 3.5–5.0)
Alkaline Phosphatase: 52 U/L (ref 38–126)
Anion gap: 8 (ref 5–15)
BUN: 6 mg/dL — ABNORMAL LOW (ref 8–23)
CO2: 22 mmol/L (ref 22–32)
Calcium: 9 mg/dL (ref 8.9–10.3)
Chloride: 94 mmol/L — ABNORMAL LOW (ref 98–111)
Creatinine, Ser: 0.8 mg/dL (ref 0.44–1.00)
GFR calc Af Amer: >60 mL/min (ref >60)
GFR calc non Af Amer: >60 mL/min (ref >60)
Glucose, Bld: 107 mg/dL — ABNORMAL HIGH (ref 70–99)
Potassium: 3.9 mmol/L (ref 3.5–5.1)
Sodium: 124 mmol/L — ABNORMAL LOW (ref 135–145)
Total Bilirubin: 1.1 mg/dL (ref 0.3–1.2)
Total Protein: 5.1 g/dL — ABNORMAL LOW (ref 6.5–8.1)

## 2019-06-05 LAB — OSMOLALITY, URINE: Osmolality, Ur: 271 mOsm/kg — ABNORMAL LOW (ref 300–900)

## 2019-06-05 LAB — NA AND K (SODIUM & POTASSIUM), RAND UR
Potassium Urine: 14 mmol/L
Sodium, Ur: 85 mmol/L

## 2019-06-05 LAB — OSMOLALITY: Osmolality: 260 mOsm/kg — ABNORMAL LOW (ref 275–295)

## 2019-06-05 MED ORDER — LOSARTAN POTASSIUM 50 MG PO TABS
50.0000 mg | ORAL_TABLET | Freq: Every day | ORAL | Status: DC
  Administered 2019-06-05 – 2019-06-06 (×2): 50 mg via ORAL
  Filled 2019-06-05 (×2): qty 1

## 2019-06-05 MED ORDER — ENOXAPARIN SODIUM 40 MG/0.4ML ~~LOC~~ SOLN
40.0000 mg | SUBCUTANEOUS | Status: DC
  Administered 2019-06-05 – 2019-06-08 (×4): 40 mg via SUBCUTANEOUS
  Filled 2019-06-05 (×4): qty 0.4

## 2019-06-05 NOTE — Progress Notes (Signed)
PT Cancellation Note  Patient Details Name: Tina Ware MRN: [id number] DOB: May 07, 1940   Cancelled Treatment:    Reason Eval/Treat Not Completed: Patient at procedure or test/unavailable (CT). PT will continue to f/u with pt acutely as available.    [place] 06/05/2019, 9:25 AM

## 2019-06-05 NOTE — TOC Progression Note (Signed)
Transition of Care [facility]) - Progression Note    Patient Details  Name: JENNIFFER MOHAMMAD MRN: [id number] Date of Birth: Apr 30, 1940  Transition of Care [facility] CM/SW Contact  Jacalyn Lefevre Edson Snowball, RN Phone Number: 06/05/2019, 3:57 PM  Clinical Narrative:     Patient confirmed face sheet information. 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   PT recommending HHPT and walker. Provided Medicare.gov home health agency list. Patient chose [place].   Patient needs rolling walker.   [place], they do not have any rolling walkers.   Called Jermain with [employer] awaiting call back.   Expected Discharge Plan: [place] Barriers to Discharge: Continued Medical Work up  Expected Discharge Plan and Services Expected Discharge Plan: [place] Choice: [place] arrangements for the past 2 months: Apartment                 DME Arranged: Walker rolling         HH Arranged: PT HH Agency: [place] Date [place]: 06/05/19 Time [place]: [id number] Representative spoke with at [place]: [place] ([place]) Interventions    Readmission Risk Interventions No flowsheet data found.

## 2019-06-05 NOTE — Progress Notes (Signed)
SLP Cancellation Note  Patient Details Name: Tina Ware MRN: [id number] DOB: 1940-09-30   Cancelled treatment:       Reason Eval/Treat Not Completed: Patient at procedure or test/unavailable(Pt tranferring off unit for CT. SLP will f/u)  Tobie Poet I. Hardin Negus, [place], [place] Office number [phone] Pager [place] 06/05/2019, 9:07 AM

## 2019-06-05 NOTE — Evaluation (Signed)
Clinical/Bedside Swallow Evaluation Patient Details  Name: Tina Ware MRN: [id number] Date of Birth: 10-22-1940  Today's Date: 06/05/2019 Time: SLP Start Time (ACUTE ONLY): [id number] SLP Stop Time (ACUTE ONLY): 0954 SLP Time Calculation (min) (ACUTE ONLY): 15 min  Past Medical History:  Past Medical History:  Diagnosis Date  . Arthritis    RA  . CAD (coronary artery disease)   . Fibroid, uterine   . Hypertension    Past Surgical History:  Past Surgical History:  Procedure Laterality Date  . CARPAL TUNNEL RELEASE Right 12/20/2018  . CATARACT EXTRACTION, BILATERAL    . CHOLECYSTECTOMY    . TRIGGER FINGER RELEASE Right 12/20/2018   HPI:  Pt is a 79 y.o. female with medical history significant of hypertension, hyperlipidemia, chronic bilateral leg swelling who presented to emergency department via EMS after fall at home due to syncopal episode. CT of the head: Soft tissue laceration and air in the left frontal scalp region. Soft tissue hematoma anteriorly on the right, inferior to the right orbit at the level of the right inferior maxilla.   Assessment / Plan / Recommendation Clinical Impression  Pt was seen for bedside swallow evaluation and she denied symptoms of dysphagia with food/liquid. She did report having difficulty swallowing pills which she stated is "psychological" and indicated that she is sure to masticate her food well. Oral mechanism exam was [facility]. She tolerated all solids and liquids without signs or symptoms of oropharyngeal dysphagia. A regular texture diet is recommended at this time with pills in apple sauce. Further skilled SLP services are not clinically indicated at this time for swallowing.  SLP Visit Diagnosis: Dysphagia, unspecified (R13.10)    Aspiration Risk  No limitations    Diet Recommendation Regular;Thin liquid   Liquid Administration via: Straw;Cup Medication Administration: Whole meds with puree Compensations: Small sips/bites Postural Changes:  Seated upright at 90 degrees    Other  Recommendations Oral Care Recommendations: Oral care BID   Follow up Recommendations None      Frequency and Duration            Prognosis        Swallow Study   General Date of Onset: 06/04/19 HPI: Pt is a 79 y.o. female with medical history significant of hypertension, hyperlipidemia, chronic bilateral leg swelling who presented to emergency department via EMS after fall at home due to syncopal episode. CT of the head: Soft tissue laceration and air in the left frontal scalp region. Soft tissue hematoma anteriorly on the right, inferior to the right orbit at the level of the right inferior maxilla. Type of Study: Bedside Swallow Evaluation Previous Swallow Assessment: None Diet Prior to this Study: Regular;Thin liquids Temperature Spikes Noted: No Respiratory Status: Room air History of Recent Intubation: No Behavior/Cognition: Alert;Cooperative;Pleasant mood Oral Cavity Assessment: Within Functional Limits Oral Care Completed by SLP: Recent completion by staff Oral Cavity - Dentition: Dentures, top;Dentures, bottom Vision: Functional for self-feeding Self-Feeding Abilities: Able to feed self Patient Positioning: Upright in bed;Postural control adequate for testing Baseline Vocal Quality: Normal Volitional Cough: Strong Volitional Swallow: Able to elicit    Oral/Motor/Sensory Function Overall Oral Motor/Sensory Function: Within functional limits   Ice Chips Ice chips: Not tested Presentation: Spoon   Thin Liquid Thin Liquid: Within functional limits Presentation: Straw    Nectar Thick Nectar Thick Liquid: Not tested   Honey Thick Honey Thick Liquid: Not tested   Puree Puree: Within functional limits Presentation: Spoon   Solid     Solid:  Within functional limits Presentation: Self Fed     Tobie Poet I. Hardin Negus, [place], [place] Office number [phone] Pager [phone]  [place] Marshall 06/05/2019,11:55 AM

## 2019-06-05 NOTE — Progress Notes (Signed)
PT Progress Note for Charges    06/05/19 1100  PT Visit Information  Last PT Received On 06/05/19  PT General Charges  $$ ACUTE PT VISIT 1 Visit  PT Evaluation  $PT Eval Low Complexity 1 Low  Eduard Clos, PT, DPT  Acute Rehabilitation Services Pager [phone] Office [phone]

## 2019-06-05 NOTE — Evaluation (Incomplete)
Physical Therapy Evaluation ?Patient Details ?Name: Tina Ware ?MRN: [id number] ?DOB: [birth year] ?Today's Date: 06/05/2019 ? ? ?History of Present Illness ? [name] is a 79 y.o. female with PMH significant of hypertension, hyperlipidemia, chronic bilateral LE swelling who presented to ED after fall (twice) at home due to syncopal episode, most likely due to change in medication. CT revealed soft tissue laceration in the left frontal scalp region and soft tissue hematoma anteriorly on the right inferior maxilla. CT was negative for brain abnormalities.    ?  ?Clinical Impression ? [name] demonstrated mild strength and mobility deficits today. Prior to bed mobility, supine BP was 161/73, pt complained of a headache. She was mod I for all bed mobility, requiring the use of 1 bed rail. Upon sitting EOB, HR was 74 and BP was 165/71, she reported mild dizziness. She was min guard for transferring sit to stand and used B UE to push up from bed. In standing, pt's HR was 80 and BP was 171/76, pt reported no change in headache or dizziness. PTA pt was ind in mobility and ADL's, today, she appeared nervous stating "my legs are weaker" and therefore utilized a RW for ambulation and safety. She expressed interest in potentially using a RW at home/community. Pt would continue to benefit from skilled physical therapy services at this time while admitted and after d/c to address the below listed limitations in order to improve overall safety and independence with functional mobility.  ? ?   ?Follow Up Recommendations   ? ?  ?Equipment Recommendations ?    ?  ?Recommendations for Other Services    ? ?  ?Precautions / Restrictions Precautions ?Precautions: Fall ?Restrictions ?Weight Bearing Restrictions: No  ? ?  ? ?Mobility ? Bed Mobility ?Overal bed mobility: Modified Independent ?  ?  ?  ?  ?  ?  ?  ? ?Transfers ?Overall transfer level: Needs assistance ?Equipment used: Rolling walker (2 wheeled) ? Transfers: Sit to/from Stand ?Sit to Stand: Min guard;Supervision

## 2019-06-05 NOTE — Progress Notes (Signed)
Physical Therapy Evaluation   Clinical Impression: [name] demonstrated mild strength and mobility deficits today. Prior to bed mobility, supine BP was 161/73, pt complained of a headache. She was mod I for all bed mobility, requiring the use of 1 bed rail. Upon sitting EOB, HR was 74 and BP was 165/71, she reported mild dizziness. She was min guard for transferring sit to stand and used B UE to push up from bed. In standing, pt's HR was 80 and BP was 171/76, pt reported no change in headache or dizziness. PTA pt was ind in mobility and ADL's, today, she appeared nervous stating "my legs are weaker" and therefore utilized a RW for ambulation and safety. She expressed interest in potentially using a RW at home/community. Pt would continue to benefit from skilled physical therapy services at this time while admitted and after d/c to address the below listed limitations in order to improve overall safety and independence with functional mobility.     06/05/19 1000  PT Visit Information  Last PT Received On 06/05/19  Assistance Needed +1  History of Present Illness Tina Ware is a 79 y.o. female with PMH significant of hypertension, hyperlipidemia, chronic bilateral LE swelling who presented to ED after fall (twice) at home due to syncopal episode, most likely due to change in medication. CT revealed soft tissue laceration in the left frontal scalp region and soft tissue hematoma anteriorly on the right inferior maxilla. CT was negative for brain abnormalities.    Precautions  Precautions Fall  Restrictions  Weight Bearing Restrictions No  Home Living  Family/patient expects to be discharged to: Private residence  Living Arrangements Children  Available Help at Discharge Family  Type of Home Other(Comment) (townhouse)  Home Access Stairs to enter  Entrance Stairs-Number of Steps 3  Entrance Stairs-Rails Left  [place] One level  Bathroom Shower/Tub Tub/shower unit  [place]  None  Prior Function  Level of Independence Independent  Communication  Communication No difficulties  Pain Assessment  Pain Assessment 0-10  Pain Score 7  Pain Location Headache  Pain Descriptors / Indicators Aching  Pain Intervention(s) Monitored during session  Cognition  Arousal/Alertness Awake/alert  Behavior During Therapy WFL for tasks assessed/performed  Overall Cognitive Status Within Functional Limits for tasks assessed  Upper Extremity Assessment  Upper Extremity Assessment Overall WFL for tasks assessed;Defer to OT evaluation  Lower Extremity Assessment  Lower Extremity Assessment RLE deficits/detail;LLE deficits/detail  RLE Deficits / Details ROM WNL. Strength: Abd and Add 5/5, Hip Flex 3+/5,  DF 3+/5  RLE Coordination WNL  LLE Deficits / Details ROM WNL. Strength: Abd and Add 5/5, Hip Flex 5/5,  DF 4/5  LLE Coordination WNL  Bed Mobility  Overal bed mobility Modified Independent (HOB elevated)  Transfers  Overall transfer level Needs assistance  Equipment used None  Transfers Sit to/from Stand  Sit to Stand Min guard (cautious d/t headache )  Ambulation/Gait  Ambulation/Gait assistance Min guard  Gait Distance (Feet) 75 Feet  Assistive device Rolling walker (2 wheeled)  Gait Pattern/deviations Step-through pattern;Decreased stride length  General Gait Details Pt stated PTA, she did not use any assistive devices but due to weakness and headache, wanted to try RW. She was able to follow verbal cues for safe use of RW and demonstrated approriate handling (close distance of RW, proper hand placement).   Balance  Overall balance assessment Needs assistance;History of Falls  Sitting-balance support No upper extremity supported;Feet supported  Sitting balance-Leahy Scale Good  Standing  balance support No upper extremity supported;Bilateral upper extremity supported;During functional activity  Standing balance-Leahy Scale Fair  Standing balance comment Static  balance: no UE support, dynamic balance: B UE support via RW for gait  General Comments  General comments (skin integrity, edema, etc.) B LE edema (pt states this is her normal)  PT - End of Session  Equipment Utilized During Treatment Gait belt  Activity Tolerance Patient tolerated treatment well  Patient left in bed;Other (comment) (with OT )  Nurse Communication Mobility status  PT Assessment  PT Recommendation/Assessment Patient needs continued PT services  PT Visit Diagnosis History of falling (Z91.81);Muscle weakness (generalized) (M62.81);Other abnormalities of gait and mobility (R26.89)  PT Problem List Decreased strength;Decreased activity tolerance;Decreased balance;Decreased mobility;Decreased knowledge of use of DME  PT Plan  PT Frequency (ACUTE ONLY) Min 3X/week  PT Treatment/Interventions (ACUTE ONLY) DME instruction;Gait training;Functional mobility training;Therapeutic activities;Therapeutic exercise;Stair training;Patient/family education  AM-PAC PT "6 Clicks" Mobility Outcome Measure (Version 2)  Help needed turning from your back to your side while in a flat bed without using bedrails? 3  Help needed moving from lying on your back to sitting on the side of a flat bed without using bedrails? 3  Help needed moving to and from a bed to a chair (including a wheelchair)? 3  Help needed standing up from a chair using your arms (e.g., wheelchair or bedside chair)? 4  Help needed to walk in hospital room? 3  Help needed climbing 3-5 steps with a railing?  3  6 Click Score 19  Consider Recommendation of Discharge To: Home with [facility]  PT Recommendation  Follow Up Recommendations Home health PT  PT equipment Rolling walker with 5" wheels  Individuals Consulted  Consulted and Agree with Results and Recommendations Patient  Acute Rehab PT Goals  Patient Stated Goal "get stronger" "need to get back to work"  PT Goal Formulation With patient  Time For Goal Achievement 06/19/19   Potential to Achieve Goals Good  PT Time Calculation  PT Start Time (ACUTE ONLY) 1011  PT Stop Time (ACUTE ONLY) 1033  PT Time Calculation (min) (ACUTE ONLY) 22 min   Jaquell Seddon, SPT Acute Rehab  PT:[id number]

## 2019-06-05 NOTE — Evaluation (Signed)
Occupational Therapy Evaluation Patient Details Name: Tina Ware MRN: [id number] DOB: 12-Mar-1941 Today's Date: 06/05/2019    History of Present Illness Tina Ware is a 79 y.o. female with PMH significant of hypertension, hyperlipidemia, chronic bilateral LE swelling who presented to ED after fall (twice) at home due to syncopal episode, most likely due to change in medication. CT revealed soft tissue laceration in the left frontal scalp region and soft tissue hematoma anteriorly on the right inferior maxilla. CT was negative for brain abnormalities.     Clinical Impression   Pt admitted with the above diagnoses and presents with below problem list. Pt will benefit from continued acute OT to address the below listed deficits and maximize independence with basic ADLs prior to d/c home. PTA pt was independent with ADLs, works as a Pharmacist, hospital. Pt currently supervision to min guard with LB ADLs and functional transfers/mobility. Plan to follow acutely.      Follow Up Recommendations  No OT follow up    Equipment Recommendations  None recommended by OT    Recommendations for Other Services       Precautions / Restrictions Precautions Precautions: Fall Restrictions Weight Bearing Restrictions: No      Mobility Bed Mobility Overal bed mobility: Modified Independent                Transfers Overall transfer level: Needs assistance Equipment used: Rolling walker (2 wheeled) Transfers: Sit to/from Stand Sit to Stand: Min guard;Supervision         General transfer comment: slow, guarded movements. pt r/o fatigue and did not sleep well last night. Also c/o headache.    Balance Overall balance assessment: Needs assistance;History of Falls Sitting-balance support: No upper extremity supported;Feet supported Sitting balance-Leahy Scale: Good     Standing balance support: No upper extremity supported;Bilateral upper extremity supported;During functional  activity Standing balance-Leahy Scale: Fair Standing balance comment: Static balance: no UE support, dynamic balance: B UE support via RW for gait                           ADL either performed or assessed with clinical judgement   ADL Overall ADL's : Needs assistance/impaired Eating/Feeding: Set up;Sitting   Grooming: Set up;Supervision/safety;Sitting;Standing   Upper Body Bathing: Set up;Sitting   Lower Body Bathing: Supervison/ safety;Sit to/from stand   Upper Body Dressing : Set up;Sitting   Lower Body Dressing: Supervision/safety;Sit to/from stand   Toilet Transfer: Supervision/safety;Ambulation;RW;Min guard   Toileting- Clothing Manipulation and Hygiene: Supervision/safety;Sit to/from stand   Tub/ Shower Transfer: Supervision/safety;Ambulation;Min guard   Functional mobility during ADLs: Supervision/safety;Min guard;Rolling walker General ADL Comments: Pt seen after PT session where she was observed to walk in the hall, household distance with rw and supervision-min guard. Fatigued at start of OT session. reports lack of sleep last night and headache.      Vision Patient Visual Report: No change from baseline       Perception     Praxis      Pertinent Vitals/Pain Pain Assessment: Faces Pain Score: 7  Faces Pain Scale: Hurts even more Pain Location: Headache Pain Descriptors / Indicators: Aching Pain Intervention(s): Monitored during session;Limited activity within patient's tolerance     Hand Dominance     Extremity/Trunk Assessment Upper Extremity Assessment Upper Extremity Assessment: Overall WFL for tasks assessed   Lower Extremity Assessment Lower Extremity Assessment: Defer to PT evaluation        Communication Communication Communication: No difficulties  Cognition Arousal/Alertness: Awake/alert Behavior During Therapy: WFL for tasks assessed/performed Overall Cognitive Status: Within Functional Limits for tasks assessed                                      General Comments  Pt feels the syncope and fall was related to medication change.     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: Impaired balance (sitting and/or standing);Decreased knowledge of use of DME or AE;Decreased knowledge of precautions;Pain      OT Treatment/Interventions: Self-care/ADL training;Energy conservation;DME and/or AE instruction;Patient/family education;Balance training;Therapeutic activities    OT Goals(Current goals can be found in the care plan section) Acute Rehab OT Goals Patient Stated Goal: "get stronger" "need to get back to work"  OT Frequency: Min 2X/week   Barriers to D/C:            Co-evaluation              AM-PAC OT "6 Clicks" Daily Activity     Outcome Measure Help from another person eating meals?: None Help from another person taking care of personal grooming?: None Help from another person toileting, which includes using toliet, bedpan, or urinal?: None Help from another person bathing (including washing, rinsing, drying)?: None Help from another person to put on and taking off regular upper body clothing?: None Help from another person to put on and taking off regular lower body clothing?: None 6 Click Score: 24   End of Session Equipment Utilized During Treatment: Rolling walker  Activity Tolerance: Patient tolerated treatment well;Patient limited by fatigue;Patient  limited by pain Patient left: in bed;with call bell/phone within reach;with bed alarm set  OT Visit Diagnosis: Unsteadiness on feet (R26.81);Pain;History of falling (Z91.81)                Time: 1030-1040 OT Time Calculation (min): 10 min Charges:  OT General Charges $OT Visit: 1 Visit OT Evaluation $OT Eval Low Complexity: [place], OT Acute Rehabilitation Services Pager: [phone] Office: [phone]   Tina Ware 06/05/2019, 1:04 PM

## 2019-06-05 NOTE — Progress Notes (Signed)
Progress Note    Tina Ware  [id number] DOB: 05-30-1940  DOA: 06/04/2019 PCP: Glendale Chard, MD    Brief Narrative:     Medical records reviewed and are as summarized below:  Tina Ware is an 79 y.o. female who presents to the ER via EMS after a fall at home.  Patient states that she started taking labetalol this a.m. for the first time after her cardiologist took her off Coreg and started labetalol to help with blood pressure.  After taking the labetalol she felt lightheaded and dizzy.  Patient states she fell and hit her head on the countertop and fell to the ground.  In the ER she was found to have multiple lab abnormalities  Assessment/Plan:   Principal Problem:   Syncope Active Problems:   Hyponatremia   Bilateral leg edema   Essential hypertension   Scalp hematoma   Blood in stool   Syncope: -Likely secondary to orthostatic hypotension due to polypharmacy.  She takes labetalol, losartan, spironolactone-HCTZ at home for blood pressure. -CT head without contrast came back negative for stroke. -Continue telemetry -Orthostatics negative -Continue IV fluids -Consulted PT/OT -Echo reading pending  Right maxilla swelling -We will check CT maxillofacial -As needed Tylenol -Ice as needed  Hyponatremia: Sodium: 123 upon admission -Possibly due to to HCTZ  -Sodium only increased to 124 this a.m. -Check urine osmole/serum osmole/urine sodium -Fluid restrict  Hypertension: -Blood pressure no longer on the soft side, will resume some home medications  Scalp hematoma on left forehead with right maxilla swelling: -Sutures applied in ED. -We will get CT scan for evaluation of the marked right maxilla swelling  Blood in stool: Likely secondary to external hemorrhoids -H&H is stable.  No history of NSAID use. -Continue Anusol  Bilateral chronic leg edema: -Patient does not appear volume overloaded -Echo reading pending -Compression stockings.   Elevate legs.   Appears a bedside swallow was not attempted so will resume a soft diet as patient is having no difficulty with her secretions nor speaking.   Family Communication/Anticipated D/C date and plan/Code Status   DVT prophylaxis: SCDs Code Status: Full Code.  Family Communication: Patient states she will speak with her family Disposition Plan: Patient has marked swelling on the right side of her maxilla.  Will get CT scan to evaluate for fractures.  Patient also has severe hyponatremia which could be contributing to some of her symptoms.  Patient will need to remain inpatient while work-up is being completed.   Medical Consultants:    None.    Subjective:   Patient complaining of worsening swelling on her right cheek  Objective:    Vitals:   06/04/19 1525 06/04/19 1736 06/04/19 2344 06/05/19 0456  BP: (!) 153/63 133/78 (!) 156/81 (!) 150/69  Pulse: 77 81 91 85  Resp: 16 16 16 16   Temp: 97.8 F (36.6 C) 97.8 F (36.6 C) 98.1 F (36.7 C) 98 F (36.7 C)  TempSrc: Oral Oral Oral Oral  SpO2: 100% 100% 100% 98%  Weight: 74.5 kg   74.2 kg  Height:        Intake/Output Summary (Last 24 hours) at 06/05/2019 0844 Last data filed at 06/05/2019 0300 Gross per 24 hour  Intake 1123.62 ml  Output --  Net 1123.62 ml   Filed Weights   06/04/19 1525 06/05/19 0456  Weight: 74.5 kg 74.2 kg    Exam: In bed, appears comfortable, able to speak on the phone Left forehead with stitches, right  maxilla swelling: Tight to touch and tender Clear, no wheezing Regular rate and rhythm, positive nonpitting edema Positive bowel sounds soft nontender Alert and oriented x3, moves all 4 extremities  Data Reviewed:   I have personally reviewed following labs and imaging studies:  Labs: Labs show the following:   Basic Metabolic Panel: Recent Labs  Lab 06/04/19 1051 06/05/19 0539  NA 123* 124*  K 3.7 3.9  CL 91* 94*  CO2 24 22  GLUCOSE 139* 107*  BUN 8 6*   CREATININE 0.92 0.80  CALCIUM 8.9 9.0  MG 1.8  --   PHOS 2.8  --    GFR Estimated Creatinine Clearance: 54.6 mL/min (by C-G formula based on SCr of 0.8 mg/dL). Liver Function Tests: Recent Labs  Lab 06/04/19 1051 06/05/19 0539  AST 22 21  ALT 17 15  ALKPHOS 49 52  BILITOT 1.0 1.1  PROT 5.5* 5.1*  ALBUMIN 3.1* 2.7*   No results for input(s): LIPASE, AMYLASE in the last 168 hours. No results for input(s): AMMONIA in the last 168 hours. Coagulation profile No results for input(s): INR, PROTIME in the last 168 hours.  CBC: Recent Labs  Lab 06/04/19 1051 06/05/19 0539  WBC 6.3 5.6  HGB 13.5 13.2  HCT 38.4 35.9*  MCV 84.6 81.8  PLT 200 197   Cardiac Enzymes: No results for input(s): CKTOTAL, CKMB, CKMBINDEX, TROPONINI in the last 168 hours. BNP (last 3 results) No results for input(s): PROBNP in the last 8760 hours. CBG: No results for input(s): GLUCAP in the last 168 hours. D-Dimer: No results for input(s): DDIMER in the last 72 hours. Hgb A1c: No results for input(s): HGBA1C in the last 72 hours. Lipid Profile: No results for input(s): CHOL, HDL, LDLCALC, TRIG, CHOLHDL, LDLDIRECT in the last 72 hours. Thyroid function studies: Recent Labs    06/04/19 1051  TSH 1.459   Anemia work up: No results for input(s): VITAMINB12, FOLATE, FERRITIN, TIBC, IRON, RETICCTPCT in the last 72 hours. Sepsis Labs: Recent Labs  Lab 06/04/19 1051 06/05/19 0539  WBC 6.3 5.6    Microbiology Recent Results (from the past 240 hour(s))  SARS CORONAVIRUS 2 (TAT 6-24 HRS) Nasopharyngeal Nasopharyngeal Swab     Status: None   Collection Time: 06/04/19  1:15 PM   Specimen: Nasopharyngeal Swab  Result Value Ref Range Status   SARS Coronavirus 2 NEGATIVE NEGATIVE Final    Comment: (NOTE) SARS-CoV-2 target nucleic acids are NOT DETECTED. The SARS-CoV-2 RNA is generally detectable in upper and lower respiratory specimens during the acute phase of infection. Negative results do  not preclude SARS-CoV-2 infection, do not rule out co-infections with other pathogens, and should not be used as the sole basis for treatment or other patient management decisions. Negative results must be combined with clinical observations, patient history, and epidemiological information. The expected result is Negative. Fact Sheet for Patients: SugarRoll.be Fact Sheet for Healthcare Providers: https://www.woods-mathews.com/ This test is not yet approved or cleared by the [place] FDA and  has been authorized for detection and/or diagnosis of SARS-CoV-2 by FDA under an Emergency Use Authorization (EUA). This EUA will remain  in effect (meaning this test can be used) for the duration of the COVID-19 declaration under Section 56 4(b)(1) of the Act, 21 U.S.C. section 360bbb-3(b)(1), unless the authorization is terminated or revoked sooner. Performed at [facility], [place] [address], [place], [place] [address]     Procedures and diagnostic studies:  CT Head Wo Contrast  Result Date:  06/04/2019 CLINICAL DATA:  Pain following fall EXAM: CT HEAD WITHOUT CONTRAST TECHNIQUE: Contiguous axial images were obtained from the base of the skull through the vertex without intravenous contrast. COMPARISON:  None. FINDINGS: Brain: Ventricles and sulci are within normal limits for age. There is no intracranial mass, hemorrhage, extra-axial fluid collection, or midline shift. Brain parenchyma appears unremarkable. No evident acute infarct. Vascular: No hyperdense vessel. There is calcification in each carotid siphon region. Skull: The bony calvarium appears intact. Soft tissue air and laceration noted in the left frontal scalp region. Sinuses/Orbits: There is mucosal thickening in each inferior maxillary antrum. There is mucosal thickening in multiple ethmoid air cells as well as in the anterior aspect of each sphenoid sinus. Orbits appear symmetric  bilaterally. Note that there is apparent hematoma anteriorly on the right, inferior to the orbit. Other: Mastoid air cells are clear. IMPRESSION: 1. Soft tissue laceration and air in the left frontal scalp region. No bony abnormality. 2. Apparent soft tissue hematoma anteriorly on the right, inferior to the right orbit at the level of the right inferior maxilla. 3.  Brain parenchyma appears unremarkable.  No mass or hemorrhage. 4.  Foci of arterial vascular calcification. 5.  Multifocal paranasal sinus disease. Electronically Signed   By: Lowella Grip III M.D.   On: 06/04/2019 11:49    Medications:   . calcium carbonate  1 tablet Oral Daily   Continuous Infusions: . sodium chloride 100 mL/hr at 06/05/19 0159     LOS: 0 days   Geradine Girt  [facility]   How to contact the [facility] Attending or Consulting provider [place] or covering provider during after hours [place], for this patient?  1. Check the care team in [facility] and look for a) attending/consulting TRH provider listed and b) the [facility] team listed 2. Log into [email or web address] and use [place]'s universal password to access. If you do not have the password, please contact the hospital operator. 3. Locate the [facility] provider you are looking for under [facility] and page to a number that you can be directly reached. 4. If you still have difficulty reaching the provider, please page the [facility] (Director on Call) for the Hospitalists listed on amion for assistance.  06/05/2019, 8:44 AM

## 2019-06-06 DIAGNOSIS — S0181XA Laceration without foreign body of other part of head, initial encounter: Secondary | ICD-10-CM

## 2019-06-06 LAB — BASIC METABOLIC PANEL
Anion gap: 8 (ref 5–15)
BUN: <5 mg/dL — ABNORMAL LOW (ref 8–23)
CO2: 23 mmol/L (ref 22–32)
Calcium: 9.1 mg/dL (ref 8.9–10.3)
Chloride: 98 mmol/L (ref 98–111)
Creatinine, Ser: 0.79 mg/dL (ref 0.44–1.00)
GFR calc Af Amer: >60 mL/min (ref >60)
GFR calc non Af Amer: >60 mL/min (ref >60)
Glucose, Bld: 97 mg/dL (ref 70–99)
Potassium: 3.9 mmol/L (ref 3.5–5.1)
Sodium: 129 mmol/L — ABNORMAL LOW (ref 135–145)

## 2019-06-06 LAB — ECHOCARDIOGRAM COMPLETE

## 2019-06-06 LAB — CBC
HCT: 40.6 % (ref 36.0–46.0)
Hemoglobin: 14.5 g/dL (ref 12.0–15.0)
MCH: 29.4 pg (ref 26.0–34.0)
MCHC: 35.7 g/dL (ref 30.0–36.0)
MCV: 82.4 fL (ref 80.0–100.0)
Platelets: 191 10*3/uL (ref 150–400)
RBC: 4.93 MIL/uL (ref 3.87–5.11)
RDW: 12.6 % (ref 11.5–15.5)
WBC: 3.6 10*3/uL — ABNORMAL LOW (ref 4.0–10.5)
nRBC: 0 % (ref 0.0–0.2)

## 2019-06-06 MED ORDER — LOSARTAN POTASSIUM 50 MG PO TABS
50.0000 mg | ORAL_TABLET | Freq: Once | ORAL | Status: AC
  Administered 2019-06-06: 50 mg via ORAL
  Filled 2019-06-06: qty 1

## 2019-06-06 MED ORDER — LOSARTAN POTASSIUM 50 MG PO TABS
100.0000 mg | ORAL_TABLET | Freq: Every day | ORAL | Status: DC
  Administered 2019-06-07 – 2019-06-08 (×2): 100 mg via ORAL
  Filled 2019-06-06 (×2): qty 2

## 2019-06-06 MED ORDER — LABETALOL HCL 100 MG PO TABS
100.0000 mg | ORAL_TABLET | Freq: Two times a day (BID) | ORAL | Status: DC
  Administered 2019-06-06 – 2019-06-08 (×5): 100 mg via ORAL
  Filled 2019-06-06 (×5): qty 1

## 2019-06-06 NOTE — Progress Notes (Signed)
Progress Note    Tina Ware  [id number] DOB: [birth year]  DOA: 06/04/2019 PCP: Glendale Chard, MD    Brief Narrative:     Medical records reviewed and are as summarized below:  Tina Ware is an 79 y.o. female who presents to the ER via EMS after a fall at home.  Patient states that she started taking labetalol this a.m. for the first time after her cardiologist took her off Coreg and started labetalol to help with blood pressure.  After taking the labetalol she felt lightheaded and dizzy.  Patient states she fell and hit her head on the countertop and fell to the ground.  In the ER she was found to have multiple lab abnormalities.    Assessment/Plan:   Principal Problem:   Syncope Active Problems:   Hyponatremia   Bilateral leg edema   Essential hypertension   Scalp hematoma   Blood in stool   Syncope: -unclear but seemed to be orthostatic even though when checked in hospital she was negative.  She takes labetalol, losartan, spironolactone-HCTZ at home for blood pressure. -CT head without contrast came back negative for stroke. -Continue telemetry with resumption of labetalol looking for heart block -PT/OT: Home health -Echo reading continues to be pending  Right maxilla swelling -No fractures -As needed Tylenol -Ice as needed  Hyponatremia: Sodium: 123 upon admission -Possibly due to to HCTZ  -Improving with fluid restriction -Fluid restrict  Hypertension: -Blood pressure no longer on the soft side, will resume some home medications  Scalp hematoma on left forehead with right maxilla swelling: -Sutures applied in ED.  Blood in stool: Likely secondary to external hemorrhoids -H&H is stable.  No history of NSAID use. -Continue Anusol  Bilateral chronic leg edema: -Patient does not appear volume overloaded -Echo reading pending -Compression stockings.  Elevate legs.     Family Communication/Anticipated D/C date and plan/Code  Status   DVT prophylaxis: SCDs Code Status: Full Code.  Family Communication: Patient states she will speak with her family Disposition Plan: Needs continued monitoring on telemetry with resumption of her labetalol.  Also needs continued correction of her sodium and blood pressure control  Medical Consultants:    None.    Subjective:   No current complaints  Objective:    Vitals:   06/05/19 2326 06/06/19 0459 06/06/19 0531 06/06/19 1048  BP: (!) 163/83 (!) 185/95  (!) 156/73  Pulse: 72 88    Resp: 18 18    Temp: 98.2 F (36.8 C) 98 F (36.7 C)    TempSrc: Oral Oral    SpO2: 100% 100%    Weight:   71.8 kg   Height:        Intake/Output Summary (Last 24 hours) at 06/06/2019 1109 Last data filed at 06/06/2019 0600 Gross per 24 hour  Intake 1842.56 ml  Output 1200 ml  Net 642.56 ml   Filed Weights   06/04/19 1525 06/05/19 0456 06/06/19 0531  Weight: 74.5 kg 74.2 kg 71.8 kg    Exam: In bed, no acute distress Linear bruising on right cheek: Tight to touch, left forehead stitching Regular rate and rhythm No increased work of breathing Alert and oriented x3 Pleasant cooperative  Data Reviewed:   I have personally reviewed following labs and imaging studies:  Labs: Labs show the following:   Basic Metabolic Panel: Recent Labs  Lab 06/04/19 1051 06/04/19 1051 06/05/19 0539 06/05/19 0539 06/05/19 1746 06/06/19 0637  NA 123*  --  124*  --  124* 129*  K 3.7   < > 3.9   < > 3.7 3.9  CL 91*  --  94*  --  92* 98  CO2 24  --  22  --  24 23  GLUCOSE 139*  --  107*  --  134* 97  BUN 8  --  6*  --  5* <5*  CREATININE 0.92  --  0.80  --  0.86 0.79  CALCIUM 8.9  --  9.0  --  9.0 9.1  MG 1.8  --   --   --   --   --   PHOS 2.8  --   --   --   --   --    < > = values in this interval not displayed.   GFR Estimated Creatinine Clearance: 53.8 mL/min (by C-G formula based on SCr of 0.79 mg/dL). Liver Function Tests: Recent Labs  Lab 06/04/19 1051  06/05/19 0539  AST 22 21  ALT 17 15  ALKPHOS 49 52  BILITOT 1.0 1.1  PROT 5.5* 5.1*  ALBUMIN 3.1* 2.7*   No results for input(s): LIPASE, AMYLASE in the last 168 hours. No results for input(s): AMMONIA in the last 168 hours. Coagulation profile No results for input(s): INR, PROTIME in the last 168 hours.  CBC: Recent Labs  Lab 06/04/19 1051 06/05/19 0539 06/06/19 0637  WBC 6.3 5.6 3.6*  HGB 13.5 13.2 14.5  HCT 38.4 35.9* 40.6  MCV 84.6 81.8 82.4  PLT 200 197 191   Cardiac Enzymes: No results for input(s): CKTOTAL, CKMB, CKMBINDEX, TROPONINI in the last 168 hours. BNP (last 3 results) No results for input(s): PROBNP in the last 8760 hours. CBG: No results for input(s): GLUCAP in the last 168 hours. D-Dimer: No results for input(s): DDIMER in the last 72 hours. Hgb A1c: No results for input(s): HGBA1C in the last 72 hours. Lipid Profile: No results for input(s): CHOL, HDL, LDLCALC, TRIG, CHOLHDL, LDLDIRECT in the last 72 hours. Thyroid function studies: Recent Labs    06/04/19 1051  TSH 1.459   Anemia work up: No results for input(s): VITAMINB12, FOLATE, FERRITIN, TIBC, IRON, RETICCTPCT in the last 72 hours. Sepsis Labs: Recent Labs  Lab 06/04/19 1051 06/05/19 0539 06/06/19 0637  WBC 6.3 5.6 3.6*    Microbiology Recent Results (from the past 240 hour(s))  SARS CORONAVIRUS 2 (TAT 6-24 HRS) Nasopharyngeal Nasopharyngeal Swab     Status: None   Collection Time: 06/04/19  1:15 PM   Specimen: Nasopharyngeal Swab  Result Value Ref Range Status   SARS Coronavirus 2 NEGATIVE NEGATIVE Final    Comment: (NOTE) SARS-CoV-2 target nucleic acids are NOT DETECTED. The SARS-CoV-2 RNA is generally detectable in upper and lower respiratory specimens during the acute phase of infection. Negative results do not preclude SARS-CoV-2 infection, do not rule out co-infections with other pathogens, and should not be used as the sole basis for treatment or other patient  management decisions. Negative results must be combined with clinical observations, patient history, and epidemiological information. The expected result is Negative. Fact Sheet for Patients: SugarRoll.be Fact Sheet for Healthcare Providers: https://www.woods-mathews.com/ This test is not yet approved or cleared by the [place] FDA and  has been authorized for detection and/or diagnosis of SARS-CoV-2 by FDA under an Emergency Use Authorization (EUA). This EUA will remain  in effect (meaning this test can be used) for the duration of the COVID-19 declaration under Section 56 4(b)(1) of the Act, 21 U.S.C. section  360bbb-3(b)(1), unless the authorization is terminated or revoked sooner. Performed at [facility], [place] [address], [place], [place] [address]     Procedures and diagnostic studies:  CT Head Wo Contrast  Result Date: 06/04/2019 CLINICAL DATA:  Pain following fall EXAM: CT HEAD WITHOUT CONTRAST TECHNIQUE: Contiguous axial images were obtained from the base of the skull through the vertex without intravenous contrast. COMPARISON:  None. FINDINGS: Brain: Ventricles and sulci are within normal limits for age. There is no intracranial mass, hemorrhage, extra-axial fluid collection, or midline shift. Brain parenchyma appears unremarkable. No evident acute infarct. Vascular: No hyperdense vessel. There is calcification in each carotid siphon region. Skull: The bony calvarium appears intact. Soft tissue air and laceration noted in the left frontal scalp region. Sinuses/Orbits: There is mucosal thickening in each inferior maxillary antrum. There is mucosal thickening in multiple ethmoid air cells as well as in the anterior aspect of each sphenoid sinus. Orbits appear symmetric bilaterally. Note that there is apparent hematoma anteriorly on the right, inferior to the orbit. Other: Mastoid air cells are clear. IMPRESSION: 1. Soft tissue  laceration and air in the left frontal scalp region. No bony abnormality. 2. Apparent soft tissue hematoma anteriorly on the right, inferior to the right orbit at the level of the right inferior maxilla. 3.  Brain parenchyma appears unremarkable.  No mass or hemorrhage. 4.  Foci of arterial vascular calcification. 5.  Multifocal paranasal sinus disease. Electronically Signed   By: Lowella Grip III M.D.   On: 06/04/2019 11:49   CT MAXILLOFACIAL WO CONTRAST  Result Date: 06/05/2019 CLINICAL DATA:  Facial trauma.  Right facial swelling EXAM: CT MAXILLOFACIAL WITHOUT CONTRAST TECHNIQUE: Multidetector CT imaging of the maxillofacial structures was performed. Multiplanar CT image reconstructions were also generated. COMPARISON:  CT head 06/04/2019. FINDINGS: Osseous: No fracture or mandibular dislocation. No destructive process. Orbits: Negative. No traumatic or inflammatory finding. Sinuses: Minimal mucosal thickening in the floors of the maxillary sinuses. No air-fluid levels. Soft tissues: Soft tissue swelling/hematoma in the right face. Limited intracranial: No acute finding. IMPRESSION: Soft tissue swelling/hematoma noted in the right face. No facial or orbital fracture. Electronically Signed   By: Rolm Baptise M.D.   On: 06/05/2019 09:44    Medications:   . calcium carbonate  1 tablet Oral Daily  . enoxaparin (LOVENOX) injection  40 mg Subcutaneous Q24H  . labetalol  100 mg Oral BID  . [START ON 06/07/2019] losartan  100 mg Oral Daily   Continuous Infusions:    LOS: 1 day   Geradine Girt  [facility]   How to contact the [facility] Attending or Consulting provider [place] or covering provider during after hours [place], for this patient?  1. Check the care team in [facility] and look for a) attending/consulting TRH provider listed and b) the [facility] team listed 2. Log into [email or web address] and use [place]'s universal password to access. If you do not have the password, please contact the hospital  operator. 3. Locate the [facility] provider you are looking for under [facility] and page to a number that you can be directly reached. 4. If you still have difficulty reaching the provider, please page the [facility] (Director on Call) for the Hospitalists listed on amion for assistance.  06/06/2019, 11:09 AM

## 2019-06-06 NOTE — Progress Notes (Signed)
Physical Therapy Treatment Patient Details Name: Tina Ware MRN: [id number] DOB: October 13, 1940 Today's Date: 06/06/2019    History of Present Illness Tina Ware is a 79 y.o. female with PMH significant of hypertension, hyperlipidemia, chronic bilateral LE swelling who presented to ED after fall (twice) at home due to syncopal episode, most likely due to change in medication. CT revealed soft tissue laceration in the left frontal scalp region and soft tissue hematoma anteriorly on the right inferior maxilla. CT was negative for brain abnormalities.      PT Comments    Patient made steady progress with therapy today. Pt able to complete bed mob/transfers at supervision level and no cues needed for safe technique with RW. Pt did report some dizziness in standing after ~3 minutes and BP dropped (see below) therefore seated rest provided. Pt symptoms subsided and she requested to use bathroom, BP improved with short bout of gait and activity (see below) and no further symptoms reported. Pt ambulated in hallway at min guard level with RW and no overt LOB. She was instructed on exercise to facilitate circulation in LE's to address edema. Patient will continue to benefit from skilled PT interventions and HHPT follow up. Acute with progress as able.  Orthostatic VS for the past 24 hrs:  BP- Sitting Pulse- Sitting BP- Standing at 0 minutes Pulse- Standing at 0 minutes BP- Standing at 3 minutes Pulse- Standing at 3 minutes  06/06/19 1414 154/74 96 127/65 85 109/74 87   Following gait to bathroom and back to recliner BP: 145/80 and HR: 98 bpm     Follow Up Recommendations  Home health PT     Equipment Recommendations  Rolling walker with 5" wheels    Recommendations for Other Services       Precautions / Restrictions Precautions Precautions: Fall Restrictions Weight Bearing Restrictions: No    Mobility  Bed Mobility Overal bed mobility: Modified Independent        General bed  mobility comments: pt requries increased time, HOB elevated  Transfers Overall transfer level: Needs assistance Equipment used: Rolling walker (2 wheeled) Transfers: Sit to/from Stand Sit to Stand: Min guard;Supervision      General transfer comment: no cues needed for safe hand placement, pt initaited power up with supervision from EOB and min guard from toilet.  Ambulation/Gait Ambulation/Gait assistance: Min guard Gait Distance (Feet): 80 Feet Assistive device: Rolling walker (2 wheeled) Gait Pattern/deviations: Step-through pattern;Decreased stride length Gait velocity: fair   General Gait Details: pt c/o dizziness after several short steps at EOB and BP taken at 3 minutes in standing as part of orthostatic vitals. BP continued to drop and pt provided seated break. symptoms subsided and pt requested to use bathroom, BP assessed after shor walk to bathroom and increased, pt then able to continue with ambulation in hallway. No overt LOB noted and no further c/o dizziness.   Stairs        Wheelchair Mobility    Modified Rankin (Stroke Patients Only)       Balance Overall balance assessment: Needs assistance;History of Falls Sitting-balance support: No upper extremity supported;Feet supported Sitting balance-Leahy Scale: Good Sitting balance - Comments: pt donned depends while sitting in chair   Standing balance support: No upper extremity supported;Bilateral upper extremity supported;During functional activity Standing balance-Leahy Scale: Fair Standing balance comment: pt able to complete pericare in static standing and finish donning depends prior to walking in the hallway.          Cognition Arousal/Alertness: Awake/alert  Behavior During Therapy: WFL for tasks assessed/performed Overall Cognitive Status: Within Functional Limits for tasks assessed             Exercises General Exercises - Lower Extremity Ankle Circles/Pumps: AROM;Both;15 reps;Seated     General Comments General comments (skin integrity, edema, etc.): pt reporting bil LE edema appears better today.      Pertinent Vitals/Pain Pain Assessment: No/denies pain           PT Goals (current goals can now be found in the care plan section) Acute Rehab PT Goals Patient Stated Goal: "get stronger" "need to get back to work" PT Goal Formulation: With patient Time For Goal Achievement: 06/19/19 Potential to Achieve Goals: Good Progress towards PT goals: Progressing toward goals    Frequency    Min 3X/week      PT Plan Current plan remains appropriate       AM-PAC PT "6 Clicks" Mobility   Outcome Measure  Help needed turning from your back to your side while in a flat bed without using bedrails?: None Help needed moving from lying on your back to sitting on the side of a flat bed without using bedrails?: A Little Help needed moving to and from a bed to a chair (including a wheelchair)?: A Little Help needed standing up from a chair using your arms (e.g., wheelchair or bedside chair)?: A Little Help needed to walk in hospital room?: A Little Help needed climbing 3-5 steps with a railing? : A Little 6 Click Score: 19    End of Session Equipment Utilized During Treatment: Gait belt Activity Tolerance: Patient tolerated treatment well Patient left: in chair;with call bell/phone within reach;with family/visitor present Nurse Communication: Mobility status PT Visit Diagnosis: History of falling (Z91.81);Muscle weakness (generalized) (M62.81);Other abnormalities of gait and mobility (R26.89)     Time: 1353-1440 PT Time Calculation (min) (ACUTE ONLY): 47 min  Charges:  $Gait Training: 8-22 mins $Therapeutic Activity: 23-37 mins                    Verner Mould, DPT Physical Therapist with [facility] [phone]  06/06/2019 5:23 PM

## 2019-06-06 NOTE — Care Management (Signed)
Spoke with Melene Muller with [employer] he will deliver walker to patient's room.   Magdalen Spatz RN

## 2019-06-07 LAB — BASIC METABOLIC PANEL
Anion gap: 11 (ref 5–15)
BUN: 5 mg/dL — ABNORMAL LOW (ref 8–23)
CO2: 26 mmol/L (ref 22–32)
Calcium: 9.3 mg/dL (ref 8.9–10.3)
Chloride: 95 mmol/L — ABNORMAL LOW (ref 98–111)
Creatinine, Ser: 0.66 mg/dL (ref 0.44–1.00)
GFR calc Af Amer: >60 mL/min (ref >60)
GFR calc non Af Amer: >60 mL/min (ref >60)
Glucose, Bld: 108 mg/dL — ABNORMAL HIGH (ref 70–99)
Potassium: 3.8 mmol/L (ref 3.5–5.1)
Sodium: 132 mmol/L — ABNORMAL LOW (ref 135–145)

## 2019-06-07 LAB — CBC
HCT: 37.2 % (ref 36.0–46.0)
Hemoglobin: 13.1 g/dL (ref 12.0–15.0)
MCH: 29.3 pg (ref 26.0–34.0)
MCHC: 35.2 g/dL (ref 30.0–36.0)
MCV: 83.2 fL (ref 80.0–100.0)
Platelets: 177 10*3/uL (ref 150–400)
RBC: 4.47 MIL/uL (ref 3.87–5.11)
RDW: 12.9 % (ref 11.5–15.5)
WBC: 4 10*3/uL (ref 4.0–10.5)
nRBC: 0 % (ref 0.0–0.2)

## 2019-06-07 MED ORDER — SODIUM CHLORIDE 0.9 % IV SOLN: INTRAVENOUS | Status: DC

## 2019-06-07 MED ORDER — DOCUSATE SODIUM 100 MG PO CAPS
100.0000 mg | ORAL_CAPSULE | Freq: Two times a day (BID) | ORAL | Status: DC
  Administered 2019-06-07 – 2019-06-08 (×3): 100 mg via ORAL
  Filled 2019-06-07 (×3): qty 1

## 2019-06-07 NOTE — Progress Notes (Signed)
Occupational Therapy Treatment Patient Details Name: Tina Ware MRN: [id number] DOB: 10-01-1940 Today's Date: 06/07/2019    History of present illness Tina Ware is a 79 y.o. female with PMH significant of hypertension, hyperlipidemia, chronic bilateral LE swelling who presented to ED after fall (twice) at home due to syncopal episode, most likely due to change in medication. CT revealed soft tissue laceration in the left frontal scalp region and soft tissue hematoma anteriorly on the right inferior maxilla. CT was negative for brain abnormalities.     OT comments  Pt eager for OOB. No lightheadedness this visit. Performing at a supervision level.   Follow Up Recommendations  No OT follow up    Equipment Recommendations  None recommended by OT    Recommendations for Other Services      Precautions / Restrictions Precautions Precautions: Fall       Mobility Bed Mobility Overal bed mobility: Modified Independent             General bed mobility comments: increased time, HOB up  Transfers Overall transfer level: Needs assistance Equipment used: Rolling walker (2 wheeled) Transfers: Sit to/from Stand Sit to Stand: Supervision         General transfer comment: pt self cues for hand placement    Balance Overall balance assessment: Needs assistance;History of Falls   Sitting balance-Leahy Scale: Good       Standing balance-Leahy Scale: Fair                             ADL either performed or assessed with clinical judgement   ADL Overall ADL's : Needs assistance/impaired Eating/Feeding: Set up;Sitting   Grooming: Wash/dry hands;Standing;Supervision/safety           Upper Body Dressing : Set up;Sitting       Toilet Transfer: Supervision/safety;RW   Toileting- Clothing Manipulation and Hygiene: Supervision/safety;Sit to/from stand       Functional mobility during ADLs: Supervision/safety;Rolling walker       Vision        Perception     Praxis      Cognition Arousal/Alertness: Awake/alert Behavior During Therapy: WFL for tasks assessed/performed Overall Cognitive Status: Within Functional Limits for tasks assessed                                          Exercises     Shoulder Instructions       General Comments      Pertinent Vitals/ Pain       Pain Assessment: No/denies pain  Home Living                                          Prior Functioning/Environment              Frequency  Min 2X/week        Progress Toward Goals  OT Goals(current goals can now be found in the care plan section)  Progress towards OT goals: Progressing toward goals  Acute Rehab OT Goals Patient Stated Goal: "get stronger" "need to get back to work"  Plan Discharge plan remains appropriate    Co-evaluation                 AM-PAC OT "6  Clicks" Daily Activity     Outcome Measure   Help from another person eating meals?: A Little Help from another person taking care of personal grooming?: A Little Help from another person toileting, which includes using toliet, bedpan, or urinal?: A Little Help from another person bathing (including washing, rinsing, drying)?: A Little Help from another person to put on and taking off regular upper body clothing?: None Help from another person to put on and taking off regular lower body clothing?: A Little 6 Click Score: 19    End of Session Equipment Utilized During Treatment: Rolling walker  OT Visit Diagnosis: Unsteadiness on feet (R26.81);Pain;History of falling (Z91.81)   Activity Tolerance Patient tolerated treatment well   Patient Left in chair;with call bell/phone within reach   Nurse Communication          Time: [id number] OT Time Calculation (min): 37 min  Charges: OT General Charges $OT Visit: 1 Visit OT Treatments $Self Care/Home Management : 23-37 mins  Nestor Lewandowsky, OTR/L Acute  Rehabilitation Services Pager: [phone] Office: [phone]   Malka So 06/07/2019, 11:44 AM

## 2019-06-07 NOTE — Progress Notes (Signed)
Physical Therapy Treatment Patient Details Name: Tina Ware MRN: [id number] DOB: [birth year] Today's Date: 06/07/2019    History of Present Illness Tina Ware is a 79 y.o. female with PMH significant of hypertension, hyperlipidemia, chronic bilateral LE swelling who presented to ED after fall (twice) at home due to syncopal episode, most likely due to change in medication. CT revealed soft tissue laceration in the left frontal scalp region and soft tissue hematoma anteriorly on the right inferior maxilla. CT was negative for brain abnormalities.      PT Comments    Pt was seen for mobility and strength training, and was able to do all ex's and walk with no fatigue issues.  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  Follow acutely for these needs and return to home with HHPT when medically ready.   Follow Up Recommendations  Home health PT     Equipment Recommendations  Rolling walker with 5" wheels    Recommendations for Other Services       Precautions / Restrictions Precautions Precautions: Fall Restrictions Weight Bearing Restrictions: No    Mobility  Bed Mobility               General bed mobility comments: up in chair when PT arrived  Transfers Overall transfer level: Needs assistance Equipment used: Rolling walker (2 wheeled) Transfers: Sit to/from Stand Sit to Stand: Supervision;Min guard         General transfer comment: cued hand placement  Ambulation/Gait Ambulation/Gait assistance: Min guard Gait Distance (Feet): 250 Feet Assistive device: Rolling walker (2 wheeled) Gait Pattern/deviations: Step-through pattern;Decreased stride length;Wide base of support   Gait velocity interpretation: <1.31 ft/sec, indicative of household ambulator General Gait Details: minimal dizziness   Stairs             Wheelchair Mobility    Modified Rankin (Stroke  Patients Only)       Balance Overall balance assessment: Needs assistance;History of Falls Sitting-balance support: Feet supported Sitting balance-Leahy Scale: Good     Standing balance support: Bilateral upper extremity supported;During functional activity Standing balance-Leahy Scale: Fair                              Cognition Arousal/Alertness: Awake/alert Behavior During Therapy: WFL for tasks assessed/performed Overall Cognitive Status: Within Functional Limits for tasks assessed                                        Exercises General Exercises - Lower Extremity Ankle Circles/Pumps: AAROM;5 reps Long Arc Quad: Strengthening;10 reps Heel Slides: Strengthening;10 reps Hip ABduction/ADduction: Strengthening;10 reps Hip Flexion/Marching: AROM;10 reps    General Comments General comments (skin integrity, edema, etc.): edema on LE's with R > L      Pertinent Vitals/Pain Pain Assessment: No/denies pain    Home Living                      Prior Function            PT Goals (current goals can now be found in the care plan section) Acute Rehab 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 Progress towards PT goals: Progressing toward goals    Frequency    Min 3X/week  PT Plan Current plan remains appropriate    Co-evaluation              AM-PAC PT "6 Clicks" Mobility   Outcome Measure  Help needed turning from your back to your side while in a flat bed without using bedrails?: None Help needed moving from lying on your back to sitting on the side of a flat bed without using bedrails?: None Help needed moving to and from a bed to a chair (including a wheelchair)?: A Little Help needed standing up from a chair using your arms (e.g., wheelchair or bedside chair)?: A Little Help needed to walk in hospital room?: A Little Help needed climbing 3-5 steps with a railing? : A Little 6 Click Score: 20     End of Session Equipment Utilized During Treatment: Gait belt Activity Tolerance: Patient tolerated treatment well Patient left: in chair;with call bell/phone within reach;with family/visitor present Nurse Communication: Mobility status PT Visit Diagnosis: History of falling (Z91.81);Muscle weakness (generalized) (M62.81);Other abnormalities of gait and mobility (R26.89)     Time: [id number] PT Time Calculation (min) (ACUTE ONLY): 40 min  Charges:  $Gait Training: 8-22 mins $Therapeutic Exercise: 8-22 mins $Therapeutic Activity: 8-22 mins                    Ramond Dial 06/07/2019, 3:22 PM  Mee Hives, PT MS Acute Rehab Dept. Number: [place]

## 2019-06-07 NOTE — Progress Notes (Signed)
Progress Note    Tina Ware  [id number] DOB: [birth year]  DOA: 06/04/2019 PCP: Glendale Chard, MD    Brief Narrative:     Medical records reviewed and are as summarized below:  Tina Ware is an 79 y.o. female who presents to the ER via EMS after a fall at home.  Patient states that she started taking labetalol this a.m. for the first time after her cardiologist took her off Coreg and started labetalol to help with blood pressure.  After taking the labetalol she felt lightheaded and dizzy.  Patient states she fell and hit her head on the countertop and fell to the ground.  In the ER she was found to have multiple lab abnormalities.    Assessment/Plan:   Principal Problem:   Syncope Active Problems:   Hyponatremia   Bilateral leg edema   Essential hypertension   Scalp hematoma   Blood in stool   Syncope: -unclear but seemed to be orthostatic even though when checked in hospital she was negative.  She takes labetalol, losartan, spironolactone-HCTZ at home for blood pressure. -CT head without contrast came back negative for stroke. -Continue telemetry with resumption of labetalol looking for heart block -PT/OT: Home health -Echo reading: Left ventricular ejection fraction, by estimation, is 60 to 65%. The  left ventricle has normal function. The left ventricle has no regional  wall motion abnormalities. Left ventricular diastolic parameters are  consistent with Grade II diastolic  dysfunction (pseudonormalization). Repeat orthostatics negative -Patient wearing TED hose  Right maxilla swelling -No fractures -As needed Tylenol -Ice as needed  Hyponatremia: Sodium: 123 upon admission -Possibly due to to HCTZ  -Recheck in the a.m. -Trending up  Hypertension: -Slowly resume home blood pressure medicines as tolerated -Stop hydrochlorothiazide  Scalp hematoma on left forehead with right maxilla swelling: -Sutures applied in ED.  Blood in stool:  Likely secondary to external hemorrhoids -H&H is stable.  No history of NSAID use. -Continue Anusol  Bilateral chronic leg edema: -Patient does not appear volume overloaded -Compression stockings.  Elevate legs.     Family Communication/Anticipated D/C date and plan/Code Status   DVT prophylaxis: SCDs Code Status: Full Code.  Family Communication: Patient states she will speak with her family Disposition Plan: Suspect she can go home in the a.m. if her sodium is improved  Medical Consultants:    None.    Subjective:   Patient had no dizziness when getting up today but still does not feel like her strength is back to her baseline  Objective:    Vitals:   06/07/19 0949 06/07/19 0951 06/07/19 0954 06/07/19 1230  BP:    138/64  Pulse: 89 91 92 89  Resp:    16  Temp:    97.9 F (36.6 C)  TempSrc:    Oral  SpO2:    99%  Weight:      Height:        Intake/Output Summary (Last 24 hours) at 06/07/2019 1615 Last data filed at 06/07/2019 [id number] Gross per 24 hour  Intake -  Output 1200 ml  Net -1200 ml   Filed Weights   06/05/19 0456 06/06/19 0531 06/07/19 0521  Weight: 74.2 kg 71.8 kg 71.7 kg    Exam: Sitting in chair, linear bruise on right cheek Regular rate and rhythm Positive lower extremity edema nonpitting Alert and orient x3  Data Reviewed:   I have personally reviewed following labs and imaging studies:  Labs: Labs show the following:  Basic Metabolic Panel: Recent Labs  Lab 06/04/19 1051 06/04/19 1051 06/05/19 0539 06/05/19 0539 06/05/19 1746 06/05/19 1746 06/06/19 0637 06/07/19 0635  NA 123*  --  124*  --  124*  --  129* 132*  K 3.7   < > 3.9   < > 3.7   < > 3.9 3.8  CL 91*  --  94*  --  92*  --  98 95*  CO2 24  --  22  --  24  --  23 26  GLUCOSE 139*  --  107*  --  134*  --  97 108*  BUN 8  --  6*  --  5*  --  <5* 5*  CREATININE 0.92  --  0.80  --  0.86  --  0.79 0.66  CALCIUM 8.9  --  9.0  --  9.0  --  9.1 9.3  MG 1.8  --   --    --   --   --   --   --   PHOS 2.8  --   --   --   --   --   --   --    < > = values in this interval not displayed.   GFR Estimated Creatinine Clearance: 53.7 mL/min (by C-G formula based on SCr of 0.66 mg/dL). Liver Function Tests: Recent Labs  Lab 06/04/19 1051 06/05/19 0539  AST 22 21  ALT 17 15  ALKPHOS 49 52  BILITOT 1.0 1.1  PROT 5.5* 5.1*  ALBUMIN 3.1* 2.7*   No results for input(s): LIPASE, AMYLASE in the last 168 hours. No results for input(s): AMMONIA in the last 168 hours. Coagulation profile No results for input(s): INR, PROTIME in the last 168 hours.  CBC: Recent Labs  Lab 06/04/19 1051 06/05/19 0539 06/06/19 0637 06/07/19 0635  WBC 6.3 5.6 3.6* 4.0  HGB 13.5 13.2 14.5 13.1  HCT 38.4 35.9* 40.6 37.2  MCV 84.6 81.8 82.4 83.2  PLT 200 197 191 177   Cardiac Enzymes: No results for input(s): CKTOTAL, CKMB, CKMBINDEX, TROPONINI in the last 168 hours. BNP (last 3 results) No results for input(s): PROBNP in the last 8760 hours. CBG: No results for input(s): GLUCAP in the last 168 hours. D-Dimer: No results for input(s): DDIMER in the last 72 hours. Hgb A1c: No results for input(s): HGBA1C in the last 72 hours. Lipid Profile: No results for input(s): CHOL, HDL, LDLCALC, TRIG, CHOLHDL, LDLDIRECT in the last 72 hours. Thyroid function studies: No results for input(s): TSH, T4TOTAL, T3FREE, THYROIDAB in the last 72 hours.  Invalid input(s): FREET3 Anemia work up: No results for input(s): VITAMINB12, FOLATE, FERRITIN, TIBC, IRON, RETICCTPCT in the last 72 hours. Sepsis Labs: Recent Labs  Lab 06/04/19 1051 06/05/19 0539 06/06/19 0637 06/07/19 0635  WBC 6.3 5.6 3.6* 4.0    Microbiology Recent Results (from the past 240 hour(s))  SARS CORONAVIRUS 2 (TAT 6-24 HRS) Nasopharyngeal Nasopharyngeal Swab     Status: None   Collection Time: 06/04/19  1:15 PM   Specimen: Nasopharyngeal Swab  Result Value Ref Range Status   SARS Coronavirus 2 NEGATIVE  NEGATIVE Final    Comment: (NOTE) SARS-CoV-2 target nucleic acids are NOT DETECTED. The SARS-CoV-2 RNA is generally detectable in upper and lower respiratory specimens during the acute phase of infection. Negative results do not preclude SARS-CoV-2 infection, do not rule out co-infections with other pathogens, and should not be used as the sole basis for treatment or other patient  management decisions. Negative results must be combined with clinical observations, patient history, and epidemiological information. The expected result is Negative. Fact Sheet for Patients: SugarRoll.be Fact Sheet for Healthcare Providers: https://www.woods-mathews.com/ This test is not yet approved or cleared by the [place] FDA and  has been authorized for detection and/or diagnosis of SARS-CoV-2 by FDA under an Emergency Use Authorization (EUA). This EUA will remain  in effect (meaning this test can be used) for the duration of the COVID-19 declaration under Section 56 4(b)(1) of the Act, 21 U.S.C. section 360bbb-3(b)(1), unless the authorization is terminated or revoked sooner. Performed at [facility], [place] [address], [place], [place] [address]     Procedures and diagnostic studies:  No results found.  Medications:   . calcium carbonate  1 tablet Oral Daily  . docusate sodium  100 mg Oral BID  . enoxaparin (LOVENOX) injection  40 mg Subcutaneous Q24H  . labetalol  100 mg Oral BID  . losartan  100 mg Oral Daily   Continuous Infusions:    LOS: 2 days   Geradine Girt  [facility]   How to contact the [facility] Attending or Consulting provider [place] or covering provider during after hours [place], for this patient?  1. Check the care team in [facility] and look for a) attending/consulting [facility] provider listed and b) the [facility] team listed 2. Log into [email or web address] and use [place]'s universal password to access. If you do not have the  password, please contact the hospital operator. 3. Locate the [facility] provider you are looking for under [facility] and page to a number that you can be directly reached. 4. If you still have difficulty reaching the provider, please page the [facility] (Director on Call) for the Hospitalists listed on amion for assistance.  06/07/2019, 4:15 PM

## 2019-06-08 LAB — BASIC METABOLIC PANEL
Anion gap: 8 (ref 5–15)
BUN: 7 mg/dL — ABNORMAL LOW (ref 8–23)
CO2: 25 mmol/L (ref 22–32)
Calcium: 9 mg/dL (ref 8.9–10.3)
Chloride: 99 mmol/L (ref 98–111)
Creatinine, Ser: 0.79 mg/dL (ref 0.44–1.00)
GFR calc Af Amer: >60 mL/min (ref >60)
GFR calc non Af Amer: >60 mL/min (ref >60)
Glucose, Bld: 96 mg/dL (ref 70–99)
Potassium: 3.9 mmol/L (ref 3.5–5.1)
Sodium: 132 mmol/L — ABNORMAL LOW (ref 135–145)

## 2019-06-08 MED ORDER — LOSARTAN POTASSIUM 100 MG PO TABS: 100.0000 mg | ORAL_TABLET | Freq: Every day | ORAL | 0 refills | Status: DC

## 2019-06-08 MED ORDER — LABETALOL HCL 100 MG PO TABS: 100.0000 mg | ORAL_TABLET | Freq: Two times a day (BID) | ORAL | 0 refills | Status: DC

## 2019-06-08 NOTE — Care Management (Signed)
Notified [facility] [place] today

## 2019-06-08 NOTE — Discharge Summary (Signed)
Physician Discharge Summary  LANIJAH VENDITTI [id number] DOB: 1940-09-30 DOA: 06/04/2019  PCP: Glendale Chard, MD  Admit date: 06/04/2019 Discharge date: 06/08/2019  Admitted From: Home Discharge disposition: Home  Recommendations for Outpatient Follow-Up:   Home health BMP 1 week in follow-up with sutures in left forehead Monitor blood pressures Encourage patient to follow a low-salt diet  Discharge Diagnosis:   Principal Problem:   Syncope Active Problems:   Hyponatremia   Bilateral leg edema   Essential hypertension   Scalp hematoma   Blood in stool    Discharge Condition: Improved.  Diet recommendation: Low sodium, heart healthy.  Wound care: None.  Code status: Full.   History of Present Illness:   Tina Ware is a 79 y.o. female with medical history significant of hypertension, hyperlipidemia, chronic bilateral leg swelling presents to emergency department via EMS after fall at home due to syncopal episode.  Patient tells me that she started taking labetalol this morning for the first time after Dr. Nadyne Coombes took her off her Coreg and started labetalol to help more with her blood pressure last week.  Tells me that after taking labetalol she felt lightheaded, dizzy while brushing her teeth and then she fell and hit her head on the countertop and fell to the ground.  Patient tells me that she passed out for about 1 minute and then she called 911 and came to the emergency department for further evaluation and management.  She denies previous history of similar symptoms.  No seizure, fever, chills, chest pain, shortness of breath, palpitation, abdominal pain, urinary or bowel changes.  She tells me that she was scheduled for transthoracic echo this morning however she missed appointment due to her syncope.  She tells me that she has hemorrhoids and sometimes she noticed blood in her stool.  She was prescribed Anusol suppository however due to  rotator cuff she is unable to use suppository.  She lives with her son.  Independent on daily life activities.  No history of smoking, alcohol, street drug use.    Hospital Course by Problem:   Syncope: -unclear but seemed to be orthostatic even though when checked in hospital she was negative. She takes labetalol, losartan, spironolactone-HCTZ at home for blood pressure. -CT head without contrast came back negative for stroke. -Continue telemetry with resumption of labetalol looking for heart block -PT/OT: Home health -Echo reading: Left ventricular ejection fraction, by estimation, is 60 to 65%. The  left ventricle has normal function. The left ventricle has no regional  wall motion abnormalities. Left ventricular diastolic parameters are  consistent with Grade II diastolic  dysfunction (pseudonormalization). Repeat orthostatics negative -Blood pressure medications adjusted with improvement in patient's symptoms -Encourage patient to follow a low-salt diet  Right maxilla swelling -No fractures -As needed Tylenol -Ice as needed  Hyponatremia: Sodium: 123 upon admission -Possibly due to to HCTZ -Trending up to normal -Follow-up outpatient  Hypertension: -Slowly resume home blood pressure medicines as tolerated -Stop hydrochlorothiazide  Scalp hematoma on left forehead with right maxilla swelling: -Sutures applied in ED.-we will need follow-up outpatient  Bilateral chronic leg edema: -Compression stockings. Elevate legs. -Encourage low-salt diet    Medical Consultants:      Discharge Exam:   Vitals:   06/07/19 2330 06/08/19 0502  BP: 123/64 138/63  Pulse: 78 72  Resp: 18 18  Temp: 98.4 F (36.9 C) 98 F (36.7 C)  SpO2: 98% 99%   Vitals:   06/07/19 1230 06/07/19 1845  06/07/19 2330 06/08/19 0502  BP: 138/64 (!) 151/75 123/64 138/63  Pulse: 89 84 78 72  Resp: 16 16 18 18   Temp: 97.9 F (36.6 C) 98.5 F (36.9 C) 98.4 F (36.9 C) 98 F (36.7  C)  TempSrc: Oral Oral Oral Oral  SpO2: 99% 98% 98% 99%  Weight:    71.7 kg  Height:        General exam: Appears calm and comfortable..    The results of significant diagnostics from this hospitalization (including imaging, microbiology, ancillary and laboratory) are listed below for reference.     Procedures and Diagnostic Studies:   CT Head Wo Contrast  Result Date: 06/04/2019 CLINICAL DATA:  Pain following fall EXAM: CT HEAD WITHOUT CONTRAST TECHNIQUE: Contiguous axial images were obtained from the base of the skull through the vertex without intravenous contrast. COMPARISON:  None. FINDINGS: Brain: Ventricles and sulci are within normal limits for age. There is no intracranial mass, hemorrhage, extra-axial fluid collection, or midline shift. Brain parenchyma appears unremarkable. No evident acute infarct. Vascular: No hyperdense vessel. There is calcification in each carotid siphon region. Skull: The bony calvarium appears intact. Soft tissue air and laceration noted in the left frontal scalp region. Sinuses/Orbits: There is mucosal thickening in each inferior maxillary antrum. There is mucosal thickening in multiple ethmoid air cells as well as in the anterior aspect of each sphenoid sinus. Orbits appear symmetric bilaterally. Note that there is apparent hematoma anteriorly on the right, inferior to the orbit. Other: Mastoid air cells are clear. IMPRESSION: 1. Soft tissue laceration and air in the left frontal scalp region. No bony abnormality. 2. Apparent soft tissue hematoma anteriorly on the right, inferior to the right orbit at the level of the right inferior maxilla. 3.  Brain parenchyma appears unremarkable.  No mass or hemorrhage. 4.  Foci of arterial vascular calcification. 5.  Multifocal paranasal sinus disease. Electronically Signed   By: Lowella Grip III M.D.   On: 06/04/2019 11:49   ECHOCARDIOGRAM COMPLETE  Result Date: 06/06/2019    ECHOCARDIOGRAM REPORT   Patient Name:    Tina Ware Date of Exam: 06/04/2019 Medical Rec #:  [id number]          Height:       62.0 in Accession #:    [id number]         Weight:       164.2 lb Date of Birth:  May 05, 1940           BSA:          1.758 m Patient Age:    79 years           BP:           121/66 mmHg Patient Gender: F                  HR:           75 bpm. Exam Location:  Inpatient Procedure: 2D Echo Indications:     syncope 780.2  History:         Patient has no prior history of Echocardiogram examinations.                  Signs/Symptoms:leg edema; Risk Factors:Hypertension.  Sonographer:     Johny Chess RDCS Referring Phys:  [id number] [place] Diagnosing Phys: Adrian Prows MD IMPRESSIONS  1. Left ventricular ejection fraction, by estimation, is 60 to 65%. The left ventricle has normal function. The left ventricle has  no regional wall motion abnormalities. Left ventricular diastolic parameters are consistent with Grade II diastolic dysfunction (pseudonormalization).  2. Right ventricular systolic function is normal. The right ventricular size is normal. There is normal pulmonary artery systolic pressure.  3. Left atrial size was mildly dilated.  4. Anterior fat pad noted in the pericardium.  5. The aortic valve is normal in structure. Aortic valve regurgitation is not visualized. Mild aortic valve sclerosis is present, with no evidence of aortic valve stenosis. FINDINGS  Left Ventricle: Left ventricular ejection fraction, by estimation, is 60 to 65%. The left ventricle has normal function. The left ventricle has no regional wall motion abnormalities. The left ventricular internal cavity size was normal in size. There is  no left ventricular hypertrophy. Left ventricular diastolic parameters are consistent with Grade II diastolic dysfunction (pseudonormalization). Right Ventricle: The right ventricular size is normal. No increase in right ventricular wall thickness. Right ventricular systolic function is normal. There is normal  pulmonary artery systolic pressure. The tricuspid regurgitant velocity is 2.51 m/s, and  with an assumed right atrial pressure of 3 mmHg, the estimated right ventricular systolic pressure is [id number] mmHg. Left Atrium: Left atrial size was mildly dilated. Right Atrium: Right atrial size was normal in size. Pericardium: Anterior fat pad noted in the pericardium. There is no evidence of pericardial effusion. Mitral Valve: The mitral valve is normal in structure. Normal mobility of the mitral valve leaflets. Trivial mitral valve regurgitation. No evidence of mitral valve stenosis. Tricuspid Valve: The tricuspid valve is normal in structure. Tricuspid valve regurgitation is trivial. No evidence of tricuspid stenosis. Aortic Valve: The aortic valve is normal in structure. Aortic valve regurgitation is not visualized. Mild aortic valve sclerosis is present, with no evidence of aortic valve stenosis. Pulmonic Valve: The pulmonic valve was grossly normal. Pulmonic valve regurgitation is trivial. Aorta: The aortic root is normal in size and structure. Venous: The inferior vena cava is normal in size with greater than 50% respiratory variability, suggesting right atrial pressure of 3 mmHg. IAS/Shunts: No atrial level shunt detected by color flow Doppler. Additional Comments: There is no pleural effusion.  LEFT VENTRICLE PLAX 2D LVIDd:         4.60 cm  Diastology LVIDs:         2.60 cm  LV e' lateral:   6.64 cm/s LV PW:         0.80 cm  LV E/e' lateral: 14.4 LV IVS:        0.80 cm  LV e' medial:    7.62 cm/s LVOT diam:     1.60 cm  LV E/e' medial:  12.5 LV SV:         51 LV SV Index:   29 LVOT Area:     2.01 cm  RIGHT VENTRICLE RV S prime:     11.40 cm/s TAPSE (M-mode): 2.5 cm LEFT ATRIUM             Index       RIGHT ATRIUM          Index LA diam:        3.10 cm 1.76 cm/m  RA Area:     9.96 cm LA Vol (A2C):   25.1 ml 14.28 ml/m RA Volume:   19.60 ml 11.15 ml/m LA Vol (A4C):   25.8 ml 14.68 ml/m LA Biplane Vol: 25.6 ml 14.56  ml/m  AORTIC VALVE LVOT Vmax:   128.00 cm/s LVOT Vmean:  75.100 cm/s LVOT VTI:  0.253 m  AORTA Ao Root diam: 2.40 cm MITRAL VALVE                TRICUSPID VALVE MV Area (PHT): 3.99 cm     TR Peak grad:   25.2 mmHg MV Decel Time: 190 msec     TR Vmax:        251.00 cm/s MV E velocity: 95.40 cm/s MV A velocity: 120.00 cm/s  SHUNTS MV E/A ratio:  0.80         Systemic VTI:  0.25 m                             Systemic Diam: 1.60 cm Adrian Prows MD Electronically signed by Adrian Prows MD Signature Date/Time: 06/06/2019/9:22:43 PM    Final    CT MAXILLOFACIAL WO CONTRAST  Result Date: 06/05/2019 CLINICAL DATA:  Facial trauma.  Right facial swelling EXAM: CT MAXILLOFACIAL WITHOUT CONTRAST TECHNIQUE: Multidetector CT imaging of the maxillofacial structures was performed. Multiplanar CT image reconstructions were also generated. COMPARISON:  CT head 06/04/2019. FINDINGS: Osseous: No fracture or mandibular dislocation. No destructive process. Orbits: Negative. No traumatic or inflammatory finding. Sinuses: Minimal mucosal thickening in the floors of the maxillary sinuses. No air-fluid levels. Soft tissues: Soft tissue swelling/hematoma in the right face. Limited intracranial: No acute finding. IMPRESSION: Soft tissue swelling/hematoma noted in the right face. No facial or orbital fracture. Electronically Signed   By: Rolm Baptise M.D.   On: 06/05/2019 09:44     Labs:   Basic Metabolic Panel: Recent Labs  Lab 06/04/19 1051 06/04/19 1051 06/05/19 0539 06/05/19 0539 06/05/19 1746 06/05/19 1746 06/06/19 0637 06/06/19 0637 06/07/19 0635 06/08/19 0414  NA 123*   < > 124*  --  124*  --  129*  --  132* 132*  K 3.7   < > 3.9   < > 3.7   < > 3.9   < > 3.8 3.9  CL 91*   < > 94*  --  92*  --  98  --  95* 99  CO2 24   < > 22  --  24  --  23  --  26 25  GLUCOSE 139*   < > 107*  --  134*  --  97  --  108* 96  BUN 8   < > 6*  --  5*  --  <5*  --  5* 7*  CREATININE 0.92   < > 0.80  --  0.86  --  0.79  --  0.66 0.79    CALCIUM 8.9   < > 9.0  --  9.0  --  9.1  --  9.3 9.0  MG 1.8  --   --   --   --   --   --   --   --   --   PHOS 2.8  --   --   --   --   --   --   --   --   --    < > = values in this interval not displayed.   GFR Estimated Creatinine Clearance: 53.7 mL/min (by C-G formula based on SCr of 0.79 mg/dL). Liver Function Tests: Recent Labs  Lab 06/04/19 1051 06/05/19 0539  AST 22 21  ALT 17 15  ALKPHOS 49 52  BILITOT 1.0 1.1  PROT 5.5* 5.1*  ALBUMIN 3.1* 2.7*   No results for input(s): LIPASE,  AMYLASE in the last 168 hours. No results for input(s): AMMONIA in the last 168 hours. Coagulation profile No results for input(s): INR, PROTIME in the last 168 hours.  CBC: Recent Labs  Lab 06/04/19 1051 06/05/19 0539 06/06/19 0637 06/07/19 0635  WBC 6.3 5.6 3.6* 4.0  HGB 13.5 13.2 14.5 13.1  HCT 38.4 35.9* 40.6 37.2  MCV 84.6 81.8 82.4 83.2  PLT 200 197 191 177   Cardiac Enzymes: No results for input(s): CKTOTAL, CKMB, CKMBINDEX, TROPONINI in the last 168 hours. BNP: Invalid input(s): POCBNP CBG: No results for input(s): GLUCAP in the last 168 hours. D-Dimer No results for input(s): DDIMER in the last 72 hours. Hgb A1c No results for input(s): HGBA1C in the last 72 hours. Lipid Profile No results for input(s): CHOL, HDL, LDLCALC, TRIG, CHOLHDL, LDLDIRECT in the last 72 hours. Thyroid function studies No results for input(s): TSH, T4TOTAL, T3FREE, THYROIDAB in the last 72 hours.  Invalid input(s): FREET3 Anemia work up No results for input(s): VITAMINB12, FOLATE, FERRITIN, TIBC, IRON, RETICCTPCT in the last 72 hours. Microbiology Recent Results (from the past 240 hour(s))  SARS CORONAVIRUS 2 (TAT 6-24 HRS) Nasopharyngeal Nasopharyngeal Swab     Status: None   Collection Time: 06/04/19  1:15 PM   Specimen: Nasopharyngeal Swab  Result Value Ref Range Status   SARS Coronavirus 2 NEGATIVE NEGATIVE Final    Comment: (NOTE) SARS-CoV-2 target nucleic acids are NOT  DETECTED. The SARS-CoV-2 RNA is generally detectable in upper and lower respiratory specimens during the acute phase of infection. Negative results do not preclude SARS-CoV-2 infection, do not rule out co-infections with other pathogens, and should not be used as the sole basis for treatment or other patient management decisions. Negative results must be combined with clinical observations, patient history, and epidemiological information. The expected result is Negative. Fact Sheet for Patients: SugarRoll.be Fact Sheet for Healthcare Providers: https://www.woods-mathews.com/ This test is not yet approved or cleared by the [place] FDA and  has been authorized for detection and/or diagnosis of SARS-CoV-2 by FDA under an Emergency Use Authorization (EUA). This EUA will remain  in effect (meaning this test can be used) for the duration of the COVID-19 declaration under Section 56 4(b)(1) of the Act, 21 U.S.C. section 360bbb-3(b)(1), unless the authorization is terminated or revoked sooner. Performed at [facility], [place] [address], [place], [place] [address]      Discharge Instructions:   Discharge Instructions    Diet - low sodium heart healthy   Complete by: As directed    Increase activity slowly   Complete by: As directed      Allergies as of 06/08/2019   No Known Allergies     Medication List    STOP taking these medications   benzonatate 100 MG capsule Commonly known as: TESSALON   HYDROcodone-acetaminophen 5-325 MG tablet Commonly known as: NORCO/VICODIN   hydrocortisone 25 MG suppository Commonly known as: ANUSOL-HC   Magnesium 250 MG Tabs   spironolactone-hydrochlorothiazide 25-25 MG tablet Commonly known as: Aldactazide     TAKE these medications   aspirin EC 81 MG tablet Take 81 mg by mouth daily.   labetalol 100 MG tablet Commonly known as: NORMODYNE Take 1 tablet (100 mg total) by mouth 2 (two)  times daily. What changed:   medication strength  how much to take   losartan 100 MG tablet Commonly known as: COZAAR Take 1 tablet (100 mg total) by mouth daily. What changed:   medication strength  how much to take  when to take this  Another medication with the same name was removed. Continue taking this medication, and follow the directions you see here.   mometasone 50 MCG/ACT nasal spray Commonly known as: NASONEX PLACE 2 SPRAYS INTO THE NOSE DAILY AS NEEDED (ALLERGIES).   Vitamin D 50 MCG (2000 UT) tablet Take 2,000 Units by mouth daily.            Durable Medical Equipment  (From admission, onward)         Start     Ordered   06/05/19 1605  For home use only DME Walker rolling  Once    Question Answer Comment  Walker: With 5 Inch Wheels   Patient needs a walker to treat with the following condition Syncope      06/05/19 1604         Follow-up Information    [facility] Follow up.   Specialty: Home Health Services Why: home health  Contact information: [place]  [phone]            Time coordinating discharge: 35 minutes  Signed:  Geradine Girt DO  [facility] 06/08/2019, 8:06 AM

## 2019-06-08 NOTE — Progress Notes (Signed)
Pt given discharge summary and discharged home via son as transportation.  

## 2019-06-10 ENCOUNTER — Telehealth: Payer: Self-pay

## 2019-06-10 DIAGNOSIS — I10 Essential (primary) hypertension: Secondary | ICD-10-CM | POA: Diagnosis not present

## 2019-06-10 DIAGNOSIS — R600 Localized edema: Secondary | ICD-10-CM | POA: Diagnosis not present

## 2019-06-10 DIAGNOSIS — Z9181 History of falling: Secondary | ICD-10-CM

## 2019-06-10 DIAGNOSIS — E785 Hyperlipidemia, unspecified: Secondary | ICD-10-CM | POA: Diagnosis not present

## 2019-06-10 DIAGNOSIS — S0003XD Contusion of scalp, subsequent encounter: Secondary | ICD-10-CM | POA: Diagnosis not present

## 2019-06-10 DIAGNOSIS — Z602 Problems related to living alone: Secondary | ICD-10-CM | POA: Diagnosis not present

## 2019-06-10 DIAGNOSIS — E781 Pure hyperglyceridemia: Secondary | ICD-10-CM | POA: Diagnosis not present

## 2019-06-10 DIAGNOSIS — S0181XD Laceration without foreign body of other part of head, subsequent encounter: Secondary | ICD-10-CM | POA: Diagnosis not present

## 2019-06-10 DIAGNOSIS — E871 Hypo-osmolality and hyponatremia: Secondary | ICD-10-CM | POA: Diagnosis not present

## 2019-06-10 DIAGNOSIS — Z7982 Long term (current) use of aspirin: Secondary | ICD-10-CM | POA: Diagnosis not present

## 2019-06-10 DIAGNOSIS — W19XXXD Unspecified fall, subsequent encounter: Secondary | ICD-10-CM | POA: Diagnosis not present

## 2019-06-10 NOTE — Telephone Encounter (Signed)
Transition Care Management Follow-up Telephone Call  Date of discharge and from where: 06/08/2019 [facility]  How have you been since you were released from the hospital? Doing well. Any questions or concerns? No Items Reviewed:  Did the pt receive and understand the discharge instructions provided?Yes  Medications obtained and verified? Yes  Any new allergies since your discharge?No  Dietary orders reviewed? Yes low sodium diet.  Do you have support at home? Yes  Other (ie: DME, Home Health, etc) Yes referred to [facility] by the hospital for physical therapy.  Functional Questionnaire: (I = Independent and D = Dependent) ADL's: I  Bathing/Dressing- I   Meal Prep- I  Eating- I  Maintaining continence-I  Transferring/Ambulation-I  Managing Meds- I   Follow up appointments reviewed:    PCP Hospital f/u appt confirmed?Yes Scheduled to see Laurance Flatten, NP 06/12/2019 @ 4 pm  [facility] f/u appt confirmed? N/A  Are transportation arrangements needed? No  If their condition worsens, is the pt aware to call  their PCP or go to the ED? Yes  Was the patient provided with contact information for the PCP's office or ED? Yes  Was the pt encouraged to call back with questions or concerns? Yes

## 2019-06-12 ENCOUNTER — Encounter: Payer: Self-pay | Admitting: Nurse Practitioner

## 2019-06-12 ENCOUNTER — Other Ambulatory Visit: Payer: Self-pay

## 2019-06-12 ENCOUNTER — Ambulatory Visit: Payer: Medicare PPO | Admitting: Nurse Practitioner

## 2019-06-12 VITALS — BP 130/84 | HR 89 | Temp 98.6°F | Ht 62.2 in | Wt 161.8 lb

## 2019-06-12 DIAGNOSIS — R55 Syncope and collapse: Secondary | ICD-10-CM

## 2019-06-12 DIAGNOSIS — S0181XD Laceration without foreign body of other part of head, subsequent encounter: Secondary | ICD-10-CM

## 2019-06-12 DIAGNOSIS — E871 Hypo-osmolality and hyponatremia: Secondary | ICD-10-CM

## 2019-06-12 DIAGNOSIS — Z09 Encounter for follow-up examination after completed treatment for conditions other than malignant neoplasm: Secondary | ICD-10-CM

## 2019-06-12 NOTE — Progress Notes (Addendum)
This visit occurred during the SARS-CoV-2 public health emergency.  Safety protocols were in place, including screening questions prior to the visit, additional usage of staff PPE, and extensive cleaning of exam room while observing appropriate contact time as indicated for disinfecting solutions.  Subjective:     [name] , female    DOB: 03-28-1941 , 79 y.o.   MRN: [id number]   Chief Complaint  Patient presents with  . Hospitalization Follow-up    patient stated she is feeling ok since leaving the hospital    HPI  She had a change in her blood pressure medication which caused her to pass out twice and was admitted to the hospital on 3/9-3/13. Found to have hyponatremia and low blood pressure.  She hit her forehead that required sutures.   She has [facility] - was using a walker and she is now walking okay without any assistive devices.   She is now on labetalol 100 mg twice a day and losartan 100 mg once a day. She was discontinued off her HCTZ due to hyponatremia. She was stopped off the magnesium She is just feeling tired.    Wt Readings from Last 3 Encounters: 06/12/19 : 161 lb 12.8 oz (73.4 kg) 06/08/19 : 158 lb 1.1 oz (71.7 kg) 05/30/19 : 164 lb 3.2 oz (74.5 kg)     Past Medical History:  Diagnosis Date  . Arthritis    RA  . CAD (coronary artery disease)   . Fibroid, uterine   . Hypertension      Family History  Problem Relation Age of Onset  . Heart disease Mother   . Dementia Mother   . Congestive Heart Failure Mother   . Parkinson's disease Father   . Pancreatic cancer Sister      Current Outpatient Medications:  .  aspirin EC 81 MG tablet, Take 81 mg by mouth daily., Disp: , Rfl:  .  Cholecalciferol (VITAMIN D) 50 MCG (2000 UT) tablet, Take 2,000 Units by mouth daily. , Disp: , Rfl:  .  labetalol (NORMODYNE) 100 MG tablet, Take 1 tablet (100 mg total) by mouth 2 (two) times daily., Disp: 60 tablet, Rfl: 0 .  losartan (COZAAR)  100 MG tablet, Take 1 tablet (100 mg total) by mouth daily., Disp: 30 tablet, Rfl: 0 .  mometasone (NASONEX) 50 MCG/ACT nasal spray, PLACE 2 SPRAYS INTO THE NOSE DAILY AS NEEDED (ALLERGIES)., Disp: 17 g, Rfl: 1   No Known Allergies   Review of Systems  Constitutional: Negative.   Respiratory: Negative.   Cardiovascular: Negative.   Skin: Positive for wound (laceration with sutures left forehead, bruising to right cheek).  Neurological: Negative for dizziness and headaches.     Today's Vitals   06/12/19 1607  BP: 130/84  Pulse: 89  Temp: 98.6 F (37 C)  TempSrc: Oral  Weight: 161 lb 12.8 oz (73.4 kg)  Height: 5' 2.2" (1.58 m)  PainSc: 0-No pain   Body mass index is 29.4 kg/m.   Objective:  Physical Exam Constitutional:      Appearance: Normal appearance.  Cardiovascular:     Rate and Rhythm: Normal rate and regular rhythm.     Pulses: Normal pulses.     Heart sounds: No murmur.  Pulmonary:     Effort: Pulmonary effort is normal. No respiratory distress.     Breath sounds: Normal breath sounds.  Skin:    General: Skin is warm.     Capillary Refill: Capillary refill  takes less than 2 seconds.     Comments: Healing laceration to left forehead with sutures intact She does have bruising and firm swelling to right cheek area which she does say is better  Neurological:     General: No focal deficit present.     Mental Status: She is alert and oriented to person, place, and time.  Psychiatric:        Mood and Affect: Mood normal.        Behavior: Behavior normal.        Thought Content: Thought content normal.        Judgment: Judgment normal.         Assessment And Plan:     1. Hyponatremia  Found during hospitalization and was stopped from her HCTZ. - BMP8+eGFR  2. Syncope, unspecified syncope type She was admitted from 3/9 - 3/13 TCM Performed. A member of the clinical team spoke with the patient upon dischare. Discharge summary was reviewed in full detail  during the visit. Meds reconciled and compared to discharge meds. Medication list is updated and reviewed with the patient.  Greater than 50% face to face time was spent in counseling an coordination of care.  All questions were answered to the satisfaction of the patient.   Since being home she is doing well and is scheduled to follow up with Dr. Einar Gip in one week  3. Laceration of forehead, subsequent encounter  Healing sutures intact to left forehead, she will come back on Monday to see if dissolving as they are very thin   Minette Brine, FNP    THE PATIENT IS ENCOURAGED TO PRACTICE SOCIAL DISTANCING DUE TO THE COVID-19 PANDEMIC.

## 2019-06-13 LAB — BMP8+EGFR
BUN/Creatinine Ratio: 19 (ref 12–28)
BUN: 17 mg/dL (ref 8–27)
CO2: 22 mmol/L (ref 20–29)
Calcium: 9.3 mg/dL (ref 8.7–10.3)
Chloride: 98 mmol/L (ref 96–106)
Creatinine, Ser: 0.9 mg/dL (ref 0.57–1.00)
GFR calc Af Amer: 71 mL/min/{1.73_m2} (ref >59)
GFR calc non Af Amer: 61 mL/min/{1.73_m2} (ref >59)
Glucose: 94 mg/dL (ref 65–99)
Potassium: 4 mmol/L (ref 3.5–5.2)
Sodium: 132 mmol/L — ABNORMAL LOW (ref 134–144)

## 2019-06-17 ENCOUNTER — Other Ambulatory Visit: Payer: Self-pay

## 2019-06-17 ENCOUNTER — Ambulatory Visit: Payer: Medicare PPO

## 2019-06-17 ENCOUNTER — Ambulatory Visit: Payer: Medicare PPO | Admitting: Nurse Practitioner

## 2019-06-17 ENCOUNTER — Encounter: Payer: Self-pay | Admitting: Nurse Practitioner

## 2019-06-17 VITALS — BP 128/86 | HR 86 | Temp 97.9°F | Ht 62.6 in | Wt 162.0 lb

## 2019-06-17 DIAGNOSIS — Z4802 Encounter for removal of sutures: Secondary | ICD-10-CM

## 2019-06-17 DIAGNOSIS — Z7982 Long term (current) use of aspirin: Secondary | ICD-10-CM | POA: Diagnosis not present

## 2019-06-17 DIAGNOSIS — I10 Essential (primary) hypertension: Secondary | ICD-10-CM | POA: Diagnosis not present

## 2019-06-17 DIAGNOSIS — Z4889 Encounter for other specified surgical aftercare: Secondary | ICD-10-CM

## 2019-06-17 DIAGNOSIS — R600 Localized edema: Secondary | ICD-10-CM | POA: Diagnosis not present

## 2019-06-17 DIAGNOSIS — S0181XD Laceration without foreign body of other part of head, subsequent encounter: Secondary | ICD-10-CM | POA: Diagnosis not present

## 2019-06-17 DIAGNOSIS — E871 Hypo-osmolality and hyponatremia: Secondary | ICD-10-CM | POA: Diagnosis not present

## 2019-06-17 DIAGNOSIS — S0003XD Contusion of scalp, subsequent encounter: Secondary | ICD-10-CM | POA: Diagnosis not present

## 2019-06-17 DIAGNOSIS — E785 Hyperlipidemia, unspecified: Secondary | ICD-10-CM | POA: Diagnosis not present

## 2019-06-17 DIAGNOSIS — Z602 Problems related to living alone: Secondary | ICD-10-CM | POA: Diagnosis not present

## 2019-06-17 DIAGNOSIS — W19XXXD Unspecified fall, subsequent encounter: Secondary | ICD-10-CM | POA: Diagnosis not present

## 2019-06-17 NOTE — Progress Notes (Signed)
This visit occurred during the SARS-CoV-2 public health emergency.  Safety protocols were in place, including screening questions prior to the visit, additional usage of staff PPE, and extensive cleaning of exam room while observing appropriate contact time as indicated for disinfecting solutions.  Subjective:     [name] , female    DOB: Nov 30, 1940 , 79 y.o.   MRN: [id number]   Chief Complaint  Patient presents with  . Suture / Staple Removal    HPI  Here today for suture removal. Called to ER to confirm the type of sutures in place to left forehead.   Suture / Staple Removal The sutures were placed 11 to 14 days ago. She tried regular soap and water washings and a wound recheck since the wound repair. Her temperature was unmeasured prior to arrival. There is no redness present. There is no swelling present.     Past Medical History:  Diagnosis Date  . Arthritis    RA  . CAD (coronary artery disease)   . Fibroid, uterine   . Hypertension      Family History  Problem Relation Age of Onset  . Heart disease Mother   . Dementia Mother   . Congestive Heart Failure Mother   . Parkinson's disease Father   . Pancreatic cancer Sister      Current Outpatient Medications:  .  aspirin EC 81 MG tablet, Take 81 mg by mouth daily., Disp: , Rfl:  .  Cholecalciferol (VITAMIN D) 50 MCG (2000 UT) tablet, Take 2,000 Units by mouth daily. , Disp: , Rfl:  .  labetalol (NORMODYNE) 100 MG tablet, Take 1 tablet (100 mg total) by mouth 2 (two) times daily., Disp: 60 tablet, Rfl: 0 .  losartan (COZAAR) 100 MG tablet, Take 1 tablet (100 mg total) by mouth daily., Disp: 30 tablet, Rfl: 0 .  mometasone (NASONEX) 50 MCG/ACT nasal spray, PLACE 2 SPRAYS INTO THE NOSE DAILY AS NEEDED (ALLERGIES)., Disp: 17 g, Rfl: 1   No Known Allergies   Review of Systems  Constitutional: Negative.   Respiratory: Negative.   Skin: Positive for wound (sutures to left forehead).  Neurological:  Negative for dizziness and headaches.  Psychiatric/Behavioral: Negative.      Today's Vitals   06/17/19 1603  BP: 128/86  Pulse: 86  Temp: 97.9 F (36.6 C)  TempSrc: Oral  Weight: 162 lb 0.6 oz (73.5 kg)  Height: 5' 2.6" (1.59 m)  PainSc: 0-No pain   Body mass index is 29.07 kg/m.   Objective:  Physical Exam Constitutional:      Appearance: Normal appearance.  Pulmonary:     Effort: Pulmonary effort is normal. No respiratory distress.  Skin:    General: Skin is warm and dry.     Comments: Left forehead with 5 sutures intact, minimal redness and healing well  Neurological:     General: No focal deficit present.     Mental Status: She is alert and oriented to person, place, and time.  Psychiatric:        Mood and Affect: Mood normal.        Behavior: Behavior normal.        Thought Content: Thought content normal.        Judgment: Judgment normal.         Assessment And Plan:     1. Visit for suture removal  Removed 5 sutures to left forehead, cleansed with normal saline and applied neosporin to area and covered with  bandaid   Minette Brine, FNP    THE PATIENT IS ENCOURAGED TO PRACTICE SOCIAL DISTANCING DUE TO THE COVID-19 PANDEMIC.

## 2019-06-17 NOTE — Progress Notes (Signed)
Pt is here today for a suture check  Provider checked pt sutures

## 2019-06-18 ENCOUNTER — Ambulatory Visit: Payer: Medicare PPO | Admitting: Cardiology

## 2019-06-18 DIAGNOSIS — K5900 Constipation, unspecified: Secondary | ICD-10-CM | POA: Diagnosis not present

## 2019-06-18 DIAGNOSIS — K5730 Diverticulosis of large intestine without perforation or abscess without bleeding: Secondary | ICD-10-CM | POA: Diagnosis not present

## 2019-06-18 DIAGNOSIS — K642 Third degree hemorrhoids: Secondary | ICD-10-CM | POA: Diagnosis not present

## 2019-06-18 DIAGNOSIS — K625 Hemorrhage of anus and rectum: Secondary | ICD-10-CM | POA: Diagnosis not present

## 2019-06-19 ENCOUNTER — Ambulatory Visit: Payer: Medicare PPO | Admitting: Cardiology

## 2019-06-19 DIAGNOSIS — K645 Perianal venous thrombosis: Secondary | ICD-10-CM | POA: Diagnosis not present

## 2019-06-20 ENCOUNTER — Ambulatory Visit: Payer: Medicare PPO | Admitting: Cardiology

## 2019-06-20 ENCOUNTER — Encounter: Payer: Self-pay | Admitting: Cardiology

## 2019-06-20 ENCOUNTER — Other Ambulatory Visit: Payer: Self-pay

## 2019-06-20 VITALS — BP 141/81 | HR 83 | Temp 98.1°F | Resp 18 | Ht 62.0 in | Wt 156.0 lb

## 2019-06-20 DIAGNOSIS — I10 Essential (primary) hypertension: Secondary | ICD-10-CM

## 2019-06-20 DIAGNOSIS — R0609 Other forms of dyspnea: Secondary | ICD-10-CM

## 2019-06-20 DIAGNOSIS — R0600 Dyspnea, unspecified: Secondary | ICD-10-CM

## 2019-06-20 DIAGNOSIS — E871 Hypo-osmolality and hyponatremia: Secondary | ICD-10-CM | POA: Diagnosis not present

## 2019-06-20 DIAGNOSIS — R600 Localized edema: Secondary | ICD-10-CM | POA: Diagnosis not present

## 2019-06-20 NOTE — Progress Notes (Signed)
Primary Physician/Referring:  Glendale Chard, MD  [name], female    DOB: [birth year], 79 y.o.   MRN: [id number]  Chief Complaint  Patient presents with  . Hypertension  . Hospitalization Follow-up   HPI:    Tina Ware  is a 79 y.o. female with PMH significant for hypertension, prediabetes, hypercholesterolemia, rheumatoid arthritis who I had seen 6 weeks ago for difficult to control hypertension and leg edema presents here for follow-up.   On her last OV I had discontinued coreg and added Labetalol. She was admitted to the hospital on 06/04/2019 and discharged on 06/25/2019 after she had an episode of syncope and severe hyponatremia after she had taken 200 mg of labetalol, she was orthostatic negative , aldactazide was discontinued and she was discharged home.  She did have scalp hematoma on her left forehead.  She now presents for follow-up.  She is back on taking 100 mg labetalol and states that her blood pressure is now well controlled.  Since restricting her fluids and also watching her diet, states that her leg edema is also improved.  Past Medical History:  Diagnosis Date  . Arthritis    RA  . CAD (coronary artery disease)   . Fibroid, uterine   . Hypertension    Past Surgical History:  Procedure Laterality Date  . CARPAL TUNNEL RELEASE Right 12/20/2018  . CATARACT EXTRACTION, BILATERAL    . CHOLECYSTECTOMY    . TRIGGER FINGER RELEASE Right 12/20/2018   Social History   Tobacco Use  . Smoking status: Never Smoker  . Smokeless tobacco: Never Used  Substance Use Topics  . Alcohol use: No    Alcohol/week: 0.0 standard drinks   ROS  Review of Systems  Cardiovascular: Positive for leg swelling (improved). Negative for chest pain and dyspnea on exertion.  Gastrointestinal: Negative for melena.   Objective  Blood pressure (!) 141/81, pulse 83, temperature 98.1 F (36.7 C), temperature source Temporal, resp. rate 18, height 5\' 2"  (1.575 m),  weight 156 lb (70.8 kg), SpO2 98 %.  Vitals with BMI 06/20/2019 06/17/2019 06/17/2019  Height 5\' 2"  5' 2.6" 5' 2.6"  Weight 156 lbs 162 lbs 1 oz 162 lbs  BMI 28.53 99991111 [id number]  Systolic [id number] 0000000 0000000  Diastolic 81 86 86  Pulse 83 86 86     Physical Exam  Constitutional:  Well built and mildly obese and appears younger than stated age  Cardiovascular: Normal rate, regular rhythm, normal heart sounds and intact distal pulses. Exam reveals no gallop.  No murmur heard. Pulses:      Dorsalis pedis pulses are 2+ on the right side and 2+ on the left side.       Posterior tibial pulses are 1+ on the right side and 1+ on the left side.  2+ right below leg edema and 1-2+ left leg edema, large adipose tissue present. No JVD.  Pulmonary/Chest: Effort normal and breath sounds normal.  Abdominal: Soft. Bowel sounds are normal.  obese   Laboratory examination:   Recent Labs    06/07/19 0635 06/08/19 0414 06/12/19 1720  NA 132* 132* 132*  K 3.8 3.9 4.0  CL 95* 99 98  CO2 26 25 22   GLUCOSE 108* 96 94  BUN 5* 7* 17  CREATININE 0.66 0.79 0.90  CALCIUM 9.3 9.0 9.3  GFRNONAA >60 >60 61  GFRAA >60 >60 71   estimated creatinine clearance is 47.5 mL/min (by C-G formula based on SCr of 0.9 mg/dL).  CMP Latest Ref Rng & Units 06/12/2019 06/08/2019 06/07/2019  Glucose 65 - 99 mg/dL 94 96 108(H)  BUN 8 - 27 mg/dL 17 7(L) 5(L)  Creatinine 0.57 - 1.00 mg/dL 0.90 0.79 0.66  Sodium 134 - 144 mmol/L 132(L) 132(L) 132(L)  Potassium 3.5 - 5.2 mmol/L 4.0 3.9 3.8  Chloride 96 - 106 mmol/L 98 99 95(L)  CO2 20 - 29 mmol/L 22 25 26   Calcium 8.7 - 10.3 mg/dL 9.3 9.0 9.3  Total Protein 6.5 - 8.1 g/dL - - -  Total Bilirubin 0.3 - 1.2 mg/dL - - -  Alkaline Phos 38 - 126 U/L - - -  AST 15 - 41 U/L - - -  ALT 0 - 44 U/L - - -   CBC Latest Ref Rng & Units 06/07/2019 06/06/2019 06/05/2019  WBC 4.0 - 10.5 K/uL 4.0 3.6(L) 5.6  Hemoglobin 12.0 - 15.0 g/dL 13.1 14.5 13.2  Hematocrit 36.0 - 46.0 % 37.2 40.6 35.9(L)    Platelets 150 - 400 K/uL 177 191 197   Lipid Panel     Component Value Date/Time   CHOL 176 10/16/2018 0846   TRIG 44 10/16/2018 0846   HDL 88 10/16/2018 0846   CHOLHDL 2.0 10/16/2018 0846   LDLCALC 79 10/16/2018 0846   HEMOGLOBIN A1C Lab Results  Component Value Date   HGBA1C 5.7 (H) 06/18/2018   TSH Recent Labs    10/16/18 0846 06/04/19 1051  TSH 2.170 1.459   Medications and allergies  No Known Allergies   Current Outpatient Medications  Medication Instructions  . aspirin EC 81 mg, Daily  . labetalol (NORMODYNE) 100 mg, Oral, 2 times daily  . losartan (COZAAR) 100 mg, Oral, Daily  . mometasone (NASONEX) 50 MCG/ACT nasal spray 2 sprays, Nasal, Daily PRN  . Vitamin D 2,000 Units, Oral, Daily    Radiology:  No results found.  Cardiac Studies:   Treadmill Stress 12/16/2013: Indications: Shortness of breath. Conclusions: Negative for ischemia. The patient exercised according to the Bruce protocol, Total time recorded 4 Min. 10 sec. achieving a max heart rate of 137 which was 92% of MPHR for age and 4.9 METS of work. The baseline ECG showed NSR,low voltage. During exercise there was no ST-T changes of ischemia. Symptoms: Dyspnea. Achieved 91% MPHR. Arrhythmia: None. Baseline NIBP was 132/82. Peak NIBP was 132/82 MaxSysp was: 174 MaxDiasp was: 90. Mild decrease in functional capacity. Continue primary prevention.  Echocardiogram 12/12/2013: Left ventricle cavity is normal in size.  Mild concentric hypertrophy of the left ventricle. Mild basal septal hypertropy.  Visual EF is 55-60%. Calculated EF 53%. No wall motion abnormalities.  Mild mitral regurgitation. Mild tricuspid regurgitation. No evidence of pulmonary hypertension.   EKG:  EKG 06/20/2019: Normal sinus rhythm with rate of 80 bpm, left atrial abnormality, left axis deviation, left intrafascicular block.  Poor R wave progression, cannot exclude anteroseptal infarct old.  No evidence of ischemia.   No significant change from 04/12/2019.  Assessment     ICD-10-CM   1. Resistant hypertension  I10 EKG 12-Lead  2. Dyspnea on exertion  R06.00   3. Leg edema  R60.0 Ambulatory referral to Interventional Radiology  4. Hyponatremia  E87.1     No orders of the defined types were placed in this encounter.   There are no discontinued medications.  Recommendations:   Tina Ware  is a 79 y.o. female with PMH significant for hypertension, prediabetes, hypercholesterolemia, rheumatoid arthritis who I had seen in Jan 2021 for difficult  to control hypertension and leg edema presents here for follow-up. Patient at baseline was on  furosemide to be taken on a as needed basis and I had added Aldactazide.   On her last OV 6 weeks ago, I had discontinued coreg and added Labetalol. She was admitted to the hospital on 06/04/2019 and discharged on 06/25/2019 after she had an episode of syncope and severe hyponatremia after she had taken 200 mg of labetalol, she was orthostatic negative, aldactazide was discontinued and she was discharged home.    She is presently tolerating labetalol, watching her diet closely and has noticed marked improvement in leg edema and overall wellbeing. 10Lbs weight loss.  The blood pressure is slightly elevated I did not make any aggressive changes to her medications.    She presently tolerating 100 mg of labetalol without any side effects.  I suspect she probably will need some form of diuretic in view of leg edema, I suspect that her mild hyponatremia was related to Aldactazide.  She is on principle care management for hypertension.  We will continue to manage her closely given her advanced age.  I will make a referral for venous insufficiency study to interventional radiology.  She will continue to watch her diet and also continue to watch her weight closely and await to see her back in 2 months.  She has an appointment to see Dr. Baird Cancer in 1 month for close monitoring of her  serum sodium and blood pressure.  Adrian Prows, MD, [facility] 06/20/2019, 9:13 PM [facility]. [place] Office: [phone]

## 2019-06-21 ENCOUNTER — Telehealth: Payer: Self-pay

## 2019-06-21 DIAGNOSIS — E785 Hyperlipidemia, unspecified: Secondary | ICD-10-CM | POA: Diagnosis not present

## 2019-06-21 DIAGNOSIS — Z7982 Long term (current) use of aspirin: Secondary | ICD-10-CM | POA: Diagnosis not present

## 2019-06-21 DIAGNOSIS — I10 Essential (primary) hypertension: Secondary | ICD-10-CM | POA: Diagnosis not present

## 2019-06-21 DIAGNOSIS — S0003XD Contusion of scalp, subsequent encounter: Secondary | ICD-10-CM | POA: Diagnosis not present

## 2019-06-21 DIAGNOSIS — E871 Hypo-osmolality and hyponatremia: Secondary | ICD-10-CM | POA: Diagnosis not present

## 2019-06-21 DIAGNOSIS — S0181XD Laceration without foreign body of other part of head, subsequent encounter: Secondary | ICD-10-CM | POA: Diagnosis not present

## 2019-06-21 DIAGNOSIS — W19XXXD Unspecified fall, subsequent encounter: Secondary | ICD-10-CM | POA: Diagnosis not present

## 2019-06-21 DIAGNOSIS — R600 Localized edema: Secondary | ICD-10-CM | POA: Diagnosis not present

## 2019-06-21 DIAGNOSIS — Z602 Problems related to living alone: Secondary | ICD-10-CM | POA: Diagnosis not present

## 2019-06-21 NOTE — Telephone Encounter (Signed)
[id number] : Patient is having surgery (hemmorhoid removal) and wants to know if it is safe to take tylenol with the medications she takes now. Please advise.

## 2019-06-21 NOTE — Telephone Encounter (Signed)
Yes

## 2019-06-24 NOTE — Telephone Encounter (Signed)
Called patient, NA, LMAM stating that it is ok to takre Tylenol with all other medications.

## 2019-06-25 ENCOUNTER — Telehealth: Payer: Self-pay | Admitting: Pharmacist

## 2019-06-25 DIAGNOSIS — I10 Essential (primary) hypertension: Secondary | ICD-10-CM | POA: Diagnosis not present

## 2019-06-25 NOTE — Telephone Encounter (Signed)
Called to review missing BP readings. Pt states that she is currently out of town, but has a home health nurse still coming to her house to check her BP on a regular basis. BP recently ranging from 130-150s. Pt planning on continuing to use BP monitor moving forward in order to comply with RPM needs. Pt has home PT as well for the last 2 weeks and has been following PT's exercise recommendation. Home PT will be finished by the end of this week. Pt recently restarted her aldactazide 25/25 as discussed during her OV with Dr. Einar Gip on 3/25. Updated med list. K+ levels from 3/17 of 4.0. Still having mild edema on her legs but improving. Unable to utilize the compression stockings that she got from the clinic due to difficulty putting them on. Recommended pt try out compression stockings of similar pressure from her local pharmacy to provide better venous pressure and decrease swelling.Pt continues to watch her diet and weight closely. Next OV with Dr. Einar Gip 4/20. Will continue to monitor and follow up for BP management.

## 2019-06-26 ENCOUNTER — Other Ambulatory Visit: Payer: Medicare PPO

## 2019-06-26 DIAGNOSIS — R600 Localized edema: Secondary | ICD-10-CM | POA: Diagnosis not present

## 2019-06-26 DIAGNOSIS — Z602 Problems related to living alone: Secondary | ICD-10-CM | POA: Diagnosis not present

## 2019-06-26 DIAGNOSIS — W19XXXD Unspecified fall, subsequent encounter: Secondary | ICD-10-CM | POA: Diagnosis not present

## 2019-06-26 DIAGNOSIS — S0003XD Contusion of scalp, subsequent encounter: Secondary | ICD-10-CM | POA: Diagnosis not present

## 2019-06-26 DIAGNOSIS — E871 Hypo-osmolality and hyponatremia: Secondary | ICD-10-CM | POA: Diagnosis not present

## 2019-06-26 DIAGNOSIS — E785 Hyperlipidemia, unspecified: Secondary | ICD-10-CM | POA: Diagnosis not present

## 2019-06-26 DIAGNOSIS — I10 Essential (primary) hypertension: Secondary | ICD-10-CM | POA: Diagnosis not present

## 2019-06-26 DIAGNOSIS — S0181XD Laceration without foreign body of other part of head, subsequent encounter: Secondary | ICD-10-CM | POA: Diagnosis not present

## 2019-06-26 DIAGNOSIS — Z7982 Long term (current) use of aspirin: Secondary | ICD-10-CM | POA: Diagnosis not present

## 2019-07-01 ENCOUNTER — Ambulatory Visit: Payer: Medicare PPO | Admitting: Internal Medicine

## 2019-07-01 ENCOUNTER — Other Ambulatory Visit: Payer: Self-pay

## 2019-07-01 ENCOUNTER — Encounter: Payer: Self-pay | Admitting: Internal Medicine

## 2019-07-01 VITALS — BP 138/68 | HR 74 | Temp 98.1°F | Ht 62.0 in | Wt 161.0 lb

## 2019-07-01 DIAGNOSIS — I10 Essential (primary) hypertension: Secondary | ICD-10-CM | POA: Diagnosis not present

## 2019-07-01 DIAGNOSIS — E871 Hypo-osmolality and hyponatremia: Secondary | ICD-10-CM

## 2019-07-01 DIAGNOSIS — R7309 Other abnormal glucose: Secondary | ICD-10-CM

## 2019-07-01 DIAGNOSIS — E663 Overweight: Secondary | ICD-10-CM

## 2019-07-01 DIAGNOSIS — Z6829 Body mass index (BMI) 29.0-29.9, adult: Secondary | ICD-10-CM

## 2019-07-01 NOTE — Progress Notes (Signed)
This visit occurred during the SARS-CoV-2 public health emergency.  Safety protocols were in place, including screening questions prior to the visit, additional usage of staff PPE, and extensive cleaning of exam room while observing appropriate contact time as indicated for disinfecting solutions.  Subjective:     [name] , female    DOB: 15-Jul-1940 , 79 y.o.   MRN: [id number]   Chief Complaint  Patient presents with  . Hypertension    HPI  She presents today for further evaluation of malignant htn. She was started on labetalol 26m bid by Dr. GEinar Gip  Unfortunately, she was admitted to hospital on 3/9 for syncope possibly due to hypotension. She was admitted to the hospital on 06/04/2019 and discharged on 06/08/2019 in stable condition. She did experience hyponatremia and aldactazide was discontinued. Her dose of labetalol has been decreased to 1036mtwice daily.  She now presents for follow-up.  She states she has made some dietary changes - less salt, less processed foods.  She is back on taking 100 mg labetalol and states that her blood pressure is now well controlled and she is no longer dizzy.  She has also restarted diuretic; although Dr. GaIrven Shellingote does not state he wanted her to start this now. She does admit to RLE.   Hypertension This is a chronic problem. The current episode started more than 1 year ago. The problem has been gradually improving since onset. The problem is controlled. Pertinent negatives include no blurred vision, palpitations or shortness of breath.     Past Medical History:  Diagnosis Date  . Arthritis    RA  . CAD (coronary artery disease)   . Fibroid, uterine   . Hypertension      Family History  Problem Relation Age of Onset  . Heart disease Mother   . Dementia Mother   . Congestive Heart Failure Mother   . Parkinson's disease Father   . Pancreatic cancer Sister      Current Outpatient Medications:  .  aspirin EC 81 MG  tablet, Take 81 mg by mouth daily., Disp: , Rfl:  .  Cholecalciferol (VITAMIN D) 50 MCG (2000 UT) tablet, Take 2,000 Units by mouth daily. , Disp: , Rfl:  .  labetalol (NORMODYNE) 100 MG tablet, Take 1 tablet (100 mg total) by mouth 2 (two) times daily., Disp: 60 tablet, Rfl: 0 .  losartan (COZAAR) 100 MG tablet, Take 1 tablet (100 mg total) by mouth daily., Disp: 30 tablet, Rfl: 0 .  mometasone (NASONEX) 50 MCG/ACT nasal spray, PLACE 2 SPRAYS INTO THE NOSE DAILY AS NEEDED (ALLERGIES)., Disp: 17 g, Rfl: 1 .  spironolactone-hydrochlorothiazide (ALDACTAZIDE) 25-25 MG tablet, , Disp: , Rfl:    No Known Allergies   Review of Systems  Constitutional: Negative.   Eyes: Negative for blurred vision.  Respiratory: Negative.  Negative for shortness of breath.   Cardiovascular: Negative.  Negative for palpitations.  Gastrointestinal: Negative.   Neurological: Negative.   Psychiatric/Behavioral: Negative.      Today's Vitals   07/01/19 1508  BP: 138/68  Pulse: 74  Temp: 98.1 F (36.7 C)  TempSrc: Oral  Weight: 161 lb (73 kg)  Height: 5' 2" (1.575 m)   Body mass index is 29.45 kg/m.   Wt Readings from Last 3 Encounters:  07/01/19 161 lb (73 kg)  06/20/19 156 lb (70.8 kg)  06/17/19 162 lb 0.6 oz (73.5 kg)     Objective:  Physical Exam Vitals and nursing note reviewed.  Constitutional:      Appearance: Normal appearance.  HENT:     Head: Normocephalic and atraumatic.  Cardiovascular:     Rate and Rhythm: Normal rate and regular rhythm.     Heart sounds: Normal heart sounds.  Pulmonary:     Effort: Pulmonary effort is normal.     Breath sounds: Normal breath sounds.  Musculoskeletal:     Right lower leg: 2+ Pitting Edema present.     Left lower leg: 1+ Pitting Edema present.  Skin:    General: Skin is warm.  Neurological:     General: No focal deficit present.     Mental Status: She is alert.  Psychiatric:        Mood and Affect: Mood normal.        Behavior: Behavior  normal.         Assessment And Plan:    1. Malignant hypertension  Chronic, improved control.  She was congratulated on her dietary changes and encouraged to keep up the great work. She is encouraged to keep upcoming Cardiology appt.   2. Hyponatremia  I will recheck sodium levels today.   - BMP8+EGFR  3. Other abnormal glucose  HER A1C HAS BEEN ELEVATED IN THE PAST. I WILL CHECK AN A1C, BMET TODAY. SHE WAS ENCOURAGED TO AVOID SUGARY BEVERAGES AND PROCESSED FOODS INCLUDNG BREADS, RICE AND PASTA.  - Hemoglobin A1c  4. Overweight with body mass index (BMI) of 29 to 29.9 in adult  She is encouraged to strive for BMI less than 27 to decrease cardiac risk. She is advised to incorporate more exercise into her daily routine.     N , MD    THE PATIENT IS ENCOURAGED TO PRACTICE SOCIAL DISTANCING DUE TO THE COVID-19 PANDEMIC.   

## 2019-07-01 NOTE — Patient Instructions (Signed)
Exercising to Stay Healthy To become healthy and stay healthy, it is recommended that you do moderate-intensity and vigorous-intensity exercise. You can tell that you are exercising at a moderate intensity if your heart starts beating faster and you start breathing faster but can still hold a conversation. You can tell that you are exercising at a vigorous intensity if you are breathing much harder and faster and cannot hold a conversation while exercising. Exercising regularly is important. It has many health benefits, such as:  Improving overall fitness, flexibility, and endurance.  Increasing bone density.  Helping with weight control.  Decreasing body fat.  Increasing muscle strength.  Reducing stress and tension.  Improving overall health. How often should I exercise? Choose an activity that you enjoy, and set realistic goals. Your health care provider can help you make an activity plan that works for you. Exercise regularly as told by your health care provider. This may include:  Doing strength training two times a week, such as: ? Lifting weights. ? Using resistance bands. ? Push-ups. ? Sit-ups. ? Yoga.  Doing a certain intensity of exercise for a given amount of time. Choose from these options: ? A total of 150 minutes of moderate-intensity exercise every week. ? A total of 75 minutes of vigorous-intensity exercise every week. ? A mix of moderate-intensity and vigorous-intensity exercise every week. Children, pregnant women, people who have not exercised regularly, people who are overweight, and older adults may need to talk with a health care provider about what activities are safe to do. If you have a medical condition, be sure to talk with your health care provider before you start a new exercise program. What are some exercise ideas? Moderate-intensity exercise ideas include:  Walking 1 mile (1.6 km) in about 15  minutes.  Biking.  Hiking.  Golfing.  Dancing.  Water aerobics. Vigorous-intensity exercise ideas include:  Walking 4.5 miles (7.2 km) or more in about 1 hour.  Jogging or running 5 miles (8 km) in about 1 hour.  Biking 10 miles (16.1 km) or more in about 1 hour.  Lap swimming.  Roller-skating or in-line skating.  Cross-country skiing.  Vigorous competitive sports, such as football, basketball, and soccer.  Jumping rope.  Aerobic dancing. What are some everyday activities that can help me to get exercise?  Yard work, such as: ? Pushing a lawn mower. ? Raking and bagging leaves.  Washing your car.  Pushing a stroller.  Shoveling snow.  Gardening.  Washing windows or floors. How can I be more active in my day-to-day activities?  Use stairs instead of an elevator.  Take a walk during your lunch break.  If you drive, park your car farther away from your work or school.  If you take public transportation, get off one stop early and walk the rest of the way.  Stand up or walk around during all of your indoor phone calls.  Get up, stretch, and walk around every 30 minutes throughout the day.  Enjoy exercise with a friend. Support to continue exercising will help you keep a regular routine of activity. What guidelines can I follow while exercising?  Before you start a new exercise program, talk with your health care provider.  Do not exercise so much that you hurt yourself, feel dizzy, or get very short of breath.  Wear comfortable clothes and wear shoes with good support.  Drink plenty of water while you exercise to prevent dehydration or heat stroke.  Work out until your breathing   and your heartbeat get faster. Where to find more information  [place] Department of Health and Human Services: www.hhs.gov  Centers for Disease Control and Prevention (CDC): www.cdc.gov Summary  Exercising regularly is important. It will improve your overall fitness,  flexibility, and endurance.  Regular exercise also will improve your overall health. It can help you control your weight, reduce stress, and improve your bone density.  Do not exercise so much that you hurt yourself, feel dizzy, or get very short of breath.  Before you start a new exercise program, talk with your health care provider. This information is not intended to replace advice given to you by your health care provider. Make sure you discuss any questions you have with your health care provider. Document Revised: 02/24/2017 Document Reviewed: 02/02/2017 Elsevier Patient Education  2020 [employer]  

## 2019-07-02 ENCOUNTER — Other Ambulatory Visit: Payer: Self-pay | Admitting: Pharmacist

## 2019-07-02 DIAGNOSIS — I10 Essential (primary) hypertension: Secondary | ICD-10-CM

## 2019-07-02 LAB — BMP8+EGFR
BUN/Creatinine Ratio: 12 (ref 12–28)
BUN: 8 mg/dL (ref 8–27)
CO2: 25 mmol/L (ref 20–29)
Calcium: 9.3 mg/dL (ref 8.7–10.3)
Chloride: 98 mmol/L (ref 96–106)
Creatinine, Ser: 0.68 mg/dL (ref 0.57–1.00)
GFR calc Af Amer: 97 mL/min/{1.73_m2} (ref >59)
GFR calc non Af Amer: 84 mL/min/{1.73_m2} (ref >59)
Glucose: 80 mg/dL (ref 65–99)
Potassium: 3.8 mmol/L (ref 3.5–5.2)
Sodium: 135 mmol/L (ref 134–144)

## 2019-07-02 LAB — HEMOGLOBIN A1C
Est. average glucose Bld gHb Est-mCnc: 105 mg/dL
Hgb A1c MFr Bld: 5.3 % (ref 4.8–5.6)

## 2019-07-02 MED ORDER — LABETALOL HCL 100 MG PO TABS: 100.0000 mg | ORAL_TABLET | Freq: Two times a day (BID) | ORAL | 0 refills | Status: DC

## 2019-07-16 ENCOUNTER — Encounter: Payer: Self-pay | Admitting: Cardiology

## 2019-07-16 ENCOUNTER — Ambulatory Visit: Payer: Medicare PPO | Admitting: Cardiology

## 2019-07-16 ENCOUNTER — Other Ambulatory Visit: Payer: Self-pay

## 2019-07-16 VITALS — BP 157/79 | HR 70 | Temp 96.6°F | Resp 16 | Ht 62.0 in | Wt 159.0 lb

## 2019-07-16 DIAGNOSIS — E871 Hypo-osmolality and hyponatremia: Secondary | ICD-10-CM

## 2019-07-16 DIAGNOSIS — I10 Essential (primary) hypertension: Secondary | ICD-10-CM | POA: Diagnosis not present

## 2019-07-16 DIAGNOSIS — R600 Localized edema: Secondary | ICD-10-CM

## 2019-07-16 MED ORDER — OLMESARTAN MEDOXOMIL-HCTZ 40-25 MG PO TABS: 1.0000 | ORAL_TABLET | ORAL | 3 refills | Status: DC

## 2019-07-16 MED ORDER — LABETALOL HCL 100 MG PO TABS: 100.0000 mg | ORAL_TABLET | Freq: Two times a day (BID) | ORAL | 3 refills | Status: DC

## 2019-07-16 NOTE — Patient Instructions (Signed)
Discontinue Aldactazide and plain losartan. Switch to Olmesartan HCT 40/25 mg in morning.  Blood work in 2 weeks after you start new medication.

## 2019-07-16 NOTE — Progress Notes (Addendum)
Primary Physician/Referring:  Glendale Chard, MD  [name], female    DOB: 1940-07-15, 79 y.o.   MRN: [id number]  Chief Complaint  Patient presents with  . Leg Swelling  . Hypertension  . Results    Echocardiogram  . Follow-up    6 week   HPI:    Tina Ware  is a 79 y.o.  female with PMH significant for hypertension, prediabetes, hypercholesterolemia, rheumatoid arthritis who I had seen in Jan 2021 for difficult to control hypertension and leg edema presents here for follow-up. Patient was at baseline was on  furosemide to be taken on a as needed basis and I had added Aldactazide and added labetalol.  Aldactazide was discontinued due to hyponatremia on 06/25/2019 when she presented with dizziness and near syncope, but she has somehow restarted this back about 4 weeks ago.  Fortunately repeat BMP done by Dr. Baird Cancer was normal.  Since restricting her fluids and also watching her diet, states that her leg edema is also improved.  Today she states that she feels fuzzy every time she takes Aldactazide.  Otherwise overall doing well, mild dyspnea has remained stable, she is continue to lose weight.  Past Medical History:  Diagnosis Date  . Arthritis    RA  . CAD (coronary artery disease)   . Fibroid, uterine   . Hypertension    Past Surgical History:  Procedure Laterality Date  . CARPAL TUNNEL RELEASE Right 12/20/2018  . CATARACT EXTRACTION, BILATERAL    . CHOLECYSTECTOMY    . TRIGGER FINGER RELEASE Right 12/20/2018   Social History   Tobacco Use  . Smoking status: Never Smoker  . Smokeless tobacco: Never Used  Substance Use Topics  . Alcohol use: No    Alcohol/week: 0.0 standard drinks   ROS  Review of Systems  Cardiovascular: Positive for leg swelling (improved). Negative for chest pain and dyspnea on exertion.  Gastrointestinal: Negative for melena.   Objective  Blood pressure (!) 157/79, pulse 70, temperature (!) 96.6 F (35.9 C),  temperature source Temporal, resp. rate 16, height '5\' 2"'  (1.575 m), weight 159 lb (72.1 kg), SpO2 100 %.  Vitals with BMI 07/16/2019 07/01/2019 06/20/2019  Height '5\' 2"'  '5\' 2"'  '5\' 2"'   Weight 159 lbs 161 lbs 156 lbs  BMI 29.07 95.74 73.40  Systolic 370 964 383  Diastolic 79 68 81  Pulse 70 74 83     Physical Exam  Constitutional:  Well built and mildly obese and appears younger than stated age  Cardiovascular: Normal rate, regular rhythm, normal heart sounds and intact distal pulses. Exam reveals no gallop.  No murmur heard. Pulses:      Dorsalis pedis pulses are 2+ on the right side and 2+ on the left side.       Posterior tibial pulses are 1+ on the right side and 1+ on the left side.  2+ right below leg edema and 1-2+ left leg edema, large adipose tissue present. No JVD.  Pulmonary/Chest: Effort normal and breath sounds normal.  Abdominal: Soft. Bowel sounds are normal.  obese   Laboratory examination:   Recent Labs    06/12/19 1720 07/01/19 1530 08/06/19 1411  NA 132* 135 127*  K 4.0 3.8 3.9  CL 98 98 91*  CO2 '22 25 27  ' GLUCOSE 94 80 95  BUN '17 8 8  ' CREATININE 0.90 0.68 0.67  CALCIUM 9.3 9.3 9.3  GFRNONAA 61 84 84  GFRAA 71 97 97  estimated creatinine clearance is 53.9 mL/min (by C-G formula based on SCr of 0.67 mg/dL).  CMP Latest Ref Rng & Units 08/06/2019 07/01/2019 06/12/2019  Glucose 65 - 99 mg/dL 95 80 94  BUN 8 - 27 mg/dL '8 8 17  ' Creatinine 0.57 - 1.00 mg/dL 0.67 0.68 0.90  Sodium 134 - 144 mmol/L 127(L) 135 132(L)  Potassium 3.5 - 5.2 mmol/L 3.9 3.8 4.0  Chloride 96 - 106 mmol/L 91(L) 98 98  CO2 20 - 29 mmol/L '27 25 22  ' Calcium 8.7 - 10.3 mg/dL 9.3 9.3 9.3  Total Protein 6.5 - 8.1 g/dL - - -  Total Bilirubin 0.3 - 1.2 mg/dL - - -  Alkaline Phos 38 - 126 U/L - - -  AST 15 - 41 U/L - - -  ALT 0 - 44 U/L - - -   CBC Latest Ref Rng & Units 06/07/2019 06/06/2019 06/05/2019  WBC 4.0 - 10.5 K/uL 4.0 3.6(L) 5.6  Hemoglobin 12.0 - 15.0 g/dL 13.1 14.5 13.2  Hematocrit  36.0 - 46.0 % 37.2 40.6 35.9(L)  Platelets 150 - 400 K/uL 177 191 197   Lipid Panel     Component Value Date/Time   CHOL 176 10/16/2018 0846   TRIG 44 10/16/2018 0846   HDL 88 10/16/2018 0846   CHOLHDL 2.0 10/16/2018 0846   LDLCALC 79 10/16/2018 0846   HEMOGLOBIN A1C Lab Results  Component Value Date   HGBA1C 5.3 07/01/2019   TSH Recent Labs    10/16/18 0846 06/04/19 1051  TSH 2.170 1.459   Medications and allergies  No Known Allergies   Current Outpatient Medications  Medication Instructions  . aspirin EC 81 mg, Daily  . labetalol (NORMODYNE) 100 mg, Oral, 2 times daily  . mometasone (NASONEX) 50 MCG/ACT nasal spray 2 sprays, Nasal, Daily PRN  . olmesartan-hydrochlorothiazide (BENICAR HCT) 40-25 MG tablet 0.5 tablets, Oral, BH-each morning  . Vitamin D 2,000 Units, Oral, Daily    Radiology:  No results found.  Cardiac Studies:   Treadmill Stress 12/16/2013: Indications: Shortness of breath. Conclusions: Negative for ischemia. The patient exercised according to the Bruce protocol, Total time recorded 4 Min. 10 sec. achieving a max heart rate of 137 which was 92% of MPHR for age and 4.9 METS of work. The baseline ECG showed NSR,low voltage. During exercise there was no ST-T changes of ischemia. Symptoms: Dyspnea. Achieved 91% MPHR. Arrhythmia: None. Baseline NIBP was 132/82. Peak NIBP was 132/82 MaxSysp was: 174 MaxDiasp was: 90. Mild decrease in functional capacity. Continue primary prevention.  Echocardiogram 12/12/2013: Left ventricle cavity is normal in size.  Mild concentric hypertrophy of the left ventricle. Mild basal septal hypertropy.  Visual EF is 55-60%. Calculated EF 53%. No wall motion abnormalities.  Mild mitral regurgitation. Mild tricuspid regurgitation. No evidence of pulmonary hypertension.   EKG:  EKG 06/20/2019: Normal sinus rhythm with rate of 80 bpm, left atrial abnormality, left axis deviation, left intrafascicular block.  Poor  R wave progression, cannot exclude anteroseptal infarct old.  No evidence of ischemia.  No significant change from 04/12/2019.  Assessment     ICD-10-CM   1. Leg edema  R60.0   2. Resistant hypertension  I10 labetalol (NORMODYNE) 100 MG tablet    Basic metabolic panel    olmesartan-hydrochlorothiazide (BENICAR HCT) 40-25 MG tablet    DISCONTINUED: olmesartan-hydrochlorothiazide (BENICAR HCT) 40-25 MG tablet    Meds ordered this encounter  Medications  . labetalol (NORMODYNE) 100 MG tablet    Sig: Take 1 tablet (  100 mg total) by mouth 2 (two) times daily.    Dispense:  180 tablet    Refill:  3  . DISCONTD: olmesartan-hydrochlorothiazide (BENICAR HCT) 40-25 MG tablet    Sig: Take 1 tablet by mouth every morning.    Dispense:  30 tablet    Refill:  3    Discontinue Aldactazide and plain losartan  . olmesartan-hydrochlorothiazide (BENICAR HCT) 40-25 MG tablet    Sig: Take 0.5 tablets by mouth every morning.    Dispense:  30 tablet    Refill:  3    Discontinue Aldactazide and plain losartan    Medications Discontinued During This Encounter  Medication Reason  . losartan (COZAAR) 100 MG tablet Change in therapy  . spironolactone-hydrochlorothiazide (ALDACTAZIDE) 25-25 MG tablet Discontinued by provider  . labetalol (NORMODYNE) 100 MG tablet Reorder  . olmesartan-hydrochlorothiazide (BENICAR HCT) 40-25 MG tablet Lab parameters not met    Recommendations:   Tina Ware  is a 79 y.o. female with PMH significant for hypertension, prediabetes, hypercholesterolemia, rheumatoid arthritis who I had seen in Jan 2021 for difficult to control hypertension and leg edema presents here for follow-up. Patient was at baseline was on  furosemide to be taken on a as needed basis and I had added Aldactazide and added labetalol.  Aldactazide was discontinued due to hyponatremia on 06/25/2019 when she presented with dizziness and near syncope, but she has somehow restarted this back about 4 weeks  ago.  Fortunately repeat BMP done by Dr. Baird Cancer was normal.  She feels oozy after she takes Aldactazide. I will discontinue this and switch her Losartan to Olmesartan HCT 40/25 mg every morning and will space out the labetalol.  I will recheck BMP in 2 weeks after having made a change.  Hopefully this will get her blood pressure to goal.  She presently tolerating 100 mg of labetalol without any side effects. She is on Remote Principal Management for hypertension.   I had made a referral for venous insufficiency study to interventional radiology, I will follow up on this.  She will continue to watch her diet and also continue to watch her weight closely and await to see her back in 3 months. She requests a Handicap Sticker, will provide certification for balance issues.   I will check BMP in 2 weeks.    Adrian Prows, MD, [facility] 08/07/2019, 9:33 AM [facility] Office: [phone]  Addendum: Na levels too low on Benicar HCT, will ask her to cut in half and recheck in 2 weeks.

## 2019-07-17 ENCOUNTER — Other Ambulatory Visit: Payer: Self-pay | Admitting: Cardiology

## 2019-07-17 DIAGNOSIS — I10 Essential (primary) hypertension: Secondary | ICD-10-CM

## 2019-07-17 DIAGNOSIS — R600 Localized edema: Secondary | ICD-10-CM

## 2019-07-24 DIAGNOSIS — K645 Perianal venous thrombosis: Secondary | ICD-10-CM | POA: Diagnosis not present

## 2019-07-26 DIAGNOSIS — I10 Essential (primary) hypertension: Secondary | ICD-10-CM | POA: Diagnosis not present

## 2019-08-02 ENCOUNTER — Telehealth: Payer: Self-pay | Admitting: Pharmacist

## 2019-08-02 NOTE — Telephone Encounter (Signed)
Called pt per RPM/PCM f/u for HTN management. BP readings reviewed. BP stable and at goal. Med list reviewed and updated. Pt stopped aldactazide + losartan and started benicar HCT as discussed during last OV. Reports to be tolerating current antihypertensive regimen well and without any ADRs. Reports to feeling of "fuzzyness" has resolved since stopping aldactazide. Pt concerned about her chronic symptoms of being fatigue and tired. 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 Recommended pt try some tea or coffee in moderation to help her get through her work schedule if needed. Pt also willing to start exercising if she notices improvement in her energy level. Will continue to monitor BP readings and follow up as needed.

## 2019-08-05 ENCOUNTER — Encounter: Payer: Self-pay | Admitting: Internal Medicine

## 2019-08-06 DIAGNOSIS — I10 Essential (primary) hypertension: Secondary | ICD-10-CM | POA: Diagnosis not present

## 2019-08-07 LAB — BASIC METABOLIC PANEL
BUN/Creatinine Ratio: 12 (ref 12–28)
BUN: 8 mg/dL (ref 8–27)
CO2: 27 mmol/L (ref 20–29)
Calcium: 9.3 mg/dL (ref 8.7–10.3)
Chloride: 91 mmol/L — ABNORMAL LOW (ref 96–106)
Creatinine, Ser: 0.67 mg/dL (ref 0.57–1.00)
GFR calc Af Amer: 97 mL/min/{1.73_m2} (ref >59)
GFR calc non Af Amer: 84 mL/min/{1.73_m2} (ref >59)
Glucose: 95 mg/dL (ref 65–99)
Potassium: 3.9 mmol/L (ref 3.5–5.2)
Sodium: 127 mmol/L — ABNORMAL LOW (ref 134–144)

## 2019-08-07 MED ORDER — OLMESARTAN MEDOXOMIL-HCTZ 40-25 MG PO TABS: 0.5000 | ORAL_TABLET | ORAL | 3 refills | Status: DC

## 2019-08-07 NOTE — Addendum Note (Signed)
Addended by: Kela Millin on: 08/07/2019 09:35 AM   Modules accepted: Orders

## 2019-08-07 NOTE — Addendum Note (Signed)
Addended by: Kela Millin on: 08/07/2019 09:34 AM   Modules accepted: Orders

## 2019-08-07 NOTE — Progress Notes (Signed)
Called patient, and left a message to [facility] for lab results.

## 2019-08-07 NOTE — Progress Notes (Signed)
Patient is aware and is agreeable to cut Benicar HCT in half and will have blood work rechecked in 2 wks.

## 2019-08-16 DIAGNOSIS — E871 Hypo-osmolality and hyponatremia: Secondary | ICD-10-CM | POA: Diagnosis not present

## 2019-08-16 DIAGNOSIS — I10 Essential (primary) hypertension: Secondary | ICD-10-CM | POA: Diagnosis not present

## 2019-08-17 LAB — BASIC METABOLIC PANEL
BUN/Creatinine Ratio: 13 (ref 12–28)
BUN: 9 mg/dL (ref 8–27)
CO2: 26 mmol/L (ref 20–29)
Calcium: 9 mg/dL (ref 8.7–10.3)
Chloride: 95 mmol/L — ABNORMAL LOW (ref 96–106)
Creatinine, Ser: 0.71 mg/dL (ref 0.57–1.00)
GFR calc Af Amer: 94 mL/min/{1.73_m2} (ref >59)
GFR calc non Af Amer: 82 mL/min/{1.73_m2} (ref >59)
Glucose: 82 mg/dL (ref 65–99)
Potassium: 3.9 mmol/L (ref 3.5–5.2)
Sodium: 130 mmol/L — ABNORMAL LOW (ref 134–144)

## 2019-08-19 NOTE — Progress Notes (Signed)
Primary Physician/Referring:  Glendale Chard, MD  [name], female    DOB: 05-Sep-1940, 79 y.o.   MRN: [id number]  Chief Complaint  Patient presents with  . Hypertension  . Leg Swelling  . Follow-up    2 month    HPI:    Tina Ware  is a 79 y.o.  female with PMH significant for hypertension, prediabetes, hypercholesterolemia, rheumatoid arthritis who I had seen in Jan 2021 for difficult to control hypertension and leg edema presents here for follow-up.  Since being on labetalol, symptoms of palpitations have essentially resolved.  Spironolactone/Aldactazide was discontinued due to severe hyponatremia.  Benicar HCT was reduced to half tablet due to persistent hyponatremia on follow-up blood.  She now presents for follow-up and evaluation of hypertension and hyponatremia.  She does have chronic leg edema.  Overall she is feeling well and has noticed blood pressure to be significantly improved.  She has been enrolled in  Remote Patient Monitoring and Principal Care Management, transmission of blood pressure relatively stable and improved but still not well controlled.  She denies headache or visual disturbances or neurologic deficits.  Past Medical History:  Diagnosis Date  . Arthritis    RA  . CAD (coronary artery disease)   . Fibroid, uterine   . Hypertension    Past Surgical History:  Procedure Laterality Date  . CARPAL TUNNEL RELEASE Right 12/20/2018  . CATARACT EXTRACTION, BILATERAL    . CHOLECYSTECTOMY    . TRIGGER FINGER RELEASE Right 12/20/2018   Family History  Problem Relation Age of Onset  . Heart disease Mother   . Dementia Mother   . Congestive Heart Failure Mother   . Parkinson's disease Father   . Pancreatic cancer Sister     Social History   Tobacco Use  . Smoking status: Never Smoker  . Smokeless tobacco: Never Used  Substance Use Topics  . Alcohol use: No    Alcohol/week: 0.0 standard drinks   Marital Status: Single ROS    Review of Systems  Cardiovascular: Positive for dyspnea on exertion and leg swelling. Negative for chest pain.  Gastrointestinal: Negative for melena.   Objective  Blood pressure (!) 144/92, pulse 83, resp. rate 15, height 5\' 2"  (1.575 m), weight 169 lb 6.4 oz (76.8 kg), SpO2 96 %.  Vitals with BMI 08/20/2019 08/20/2019 07/16/2019  Height - 5\' 2"  5\' 2"   Weight - 169 lbs 6 oz 159 lbs  BMI - [id number]  Systolic [id number] 0000000 [id number]  Diastolic 92 94 79  Pulse - 83 70     Physical Exam  Constitutional:  Well built and mildly obese and appears younger than stated age  Cardiovascular: Normal rate, regular rhythm, normal heart sounds and intact distal pulses. Exam reveals no gallop.  No murmur heard. Pulses:      Dorsalis pedis pulses are 2+ on the right side and 2+ on the left side.       Posterior tibial pulses are 1+ on the right side and 1+ on the left side.  2+ below knee leg edema, large adipose tissue present. No JVD.  Pulmonary/Chest: Effort normal and breath sounds normal.  Abdominal: Soft. Bowel sounds are normal.  obese   Laboratory examination:   Recent Labs    07/01/19 1530 08/06/19 1411 08/16/19 1430  NA 135 127* 130*  K 3.8 3.9 3.9  CL 98 91* 95*  CO2 25 27 26   GLUCOSE 80 95 82  BUN 8 8 9  CREATININE 0.68 0.67 0.71  CALCIUM 9.3 9.3 9.0  GFRNONAA 84 84 82  GFRAA 97 97 94   estimated creatinine clearance is 55.6 mL/min (by C-G formula based on SCr of 0.71 mg/dL).  CMP Latest Ref Rng & Units 08/16/2019 08/06/2019 07/01/2019  Glucose 65 - 99 mg/dL 82 95 80  BUN 8 - 27 mg/dL 9 8 8   Creatinine 0.57 - 1.00 mg/dL 0.71 0.67 0.68  Sodium 134 - 144 mmol/L 130(L) 127(L) 135  Potassium 3.5 - 5.2 mmol/L 3.9 3.9 3.8  Chloride 96 - 106 mmol/L 95(L) 91(L) 98  CO2 20 - 29 mmol/L 26 27 25   Calcium 8.7 - 10.3 mg/dL 9.0 9.3 9.3  Total Protein 6.5 - 8.1 g/dL - - -  Total Bilirubin 0.3 - 1.2 mg/dL - - -  Alkaline Phos 38 - 126 U/L - - -  AST 15 - 41 U/L - - -  ALT 0 - 44 U/L - - -    CBC Latest Ref Rng & Units 06/07/2019 06/06/2019 06/05/2019  WBC 4.0 - 10.5 K/uL 4.0 3.6(L) 5.6  Hemoglobin 12.0 - 15.0 g/dL 13.1 14.5 13.2  Hematocrit 36.0 - 46.0 % 37.2 40.6 35.9(L)  Platelets 150 - 400 K/uL 177 191 197   Lipid Panel     Component Value Date/Time   CHOL 176 10/16/2018 0846   TRIG 44 10/16/2018 0846   HDL 88 10/16/2018 0846   CHOLHDL 2.0 10/16/2018 0846   LDLCALC 79 10/16/2018 0846   HEMOGLOBIN A1C Lab Results  Component Value Date   HGBA1C 5.3 07/01/2019   TSH Recent Labs    10/16/18 0846 06/04/19 1051  TSH 2.170 1.459   External Labs:  06/18/2019: H/H 13/37, PLT 226, MCV 86. Rest of CBC normal.  Ast 20, ALT 13.    Medications and allergies  No Known Allergies   Current Outpatient Medications  Medication Instructions  . aspirin EC 81 mg, Daily  . furosemide (LASIX) 20 mg, Oral, 2 times daily at 10am and 4pm  . labetalol (NORMODYNE) 100 mg, Oral, 2 times daily  . mometasone (NASONEX) 50 MCG/ACT nasal spray 2 sprays, Nasal, Daily PRN  . olmesartan-hydrochlorothiazide (BENICAR HCT) 40-25 MG tablet 0.5 tablets, Oral, BH-each morning  . Vitamin D 2,000 Units, Oral, Daily    Radiology:  No results found.  Cardiac Studies:   Treadmill Stress 12/16/2013: Indications: Shortness of breath. Conclusions: Negative for ischemia. The patient exercised according to the Bruce protocol, Total time recorded 4 Min. 10 sec. achieving a max heart rate of 137 which was 92% of MPHR for age and 4.9 METS of work. The baseline ECG showed NSR,low voltage. During exercise there was no ST-T changes of ischemia. Symptoms: Dyspnea. Achieved 91% MPHR. Arrhythmia: None. Baseline NIBP was 132/82. Peak NIBP was 132/82 MaxSysp was: 174 MaxDiasp was: 90. Mild decrease in functional capacity. Continue primary prevention.  Echocardiogram 06/04/2019:  1. Left ventricular ejection fraction, by estimation, is 60 to 65%. The left ventricle has normal function. The left  ventricle has no regional  wall motion abnormalities. Left ventricular diastolic parameters are consistent with Grade II diastolic  dysfunction (pseudonormalization).  2. Right ventricular systolic function is normal. The right ventricular size is normal. There is normal pulmonary artery systolic pressure.  3. Left atrial size was mildly dilated.  4. Anterior fat pad noted in the pericardium.  5. The aortic valve is normal in structure. Aortic valve regurgitation is not visualized. Mild aortic valve sclerosis is present, with no evidence of  aortic valve stenosis.   EKG:  06/20/2019: Normal sinus rhythm with rate of 80 bpm, left atrial abnormality, left axis deviation, left intrafascicular block.  Poor R wave progression, cannot exclude anteroseptal infarct old.  No evidence of ischemia.  No significant change from 04/12/2019.  Assessment     ICD-10-CM   1. Resistant hypertension  I10   2. Hyponatremia with excess extracellular fluid volume  E87.1 furosemide (LASIX) 20 MG tablet    Basic metabolic panel    Brain natriuretic peptide    Basic metabolic panel    Brain natriuretic peptide  3. Chronic diastolic (congestive) heart failure (HCC)  99991111 Basic metabolic panel    Brain natriuretic peptide    Basic metabolic panel    Brain natriuretic peptide     Meds ordered this encounter  Medications  . furosemide (LASIX) 20 MG tablet    Sig: Take 1 tablet (20 mg total) by mouth 2 (two) times daily at 10 am and 4 pm.    Dispense:  60 tablet    Refill:  2    There are no discontinued medications.  Recommendations:   Tina Ware  is a 79 y.o. AA female with PMH significant for hypertension, prediabetes, hypercholesterolemia, rheumatoid arthritis who I had seen in Jan 2021 for difficult to control hypertension and leg edema presents here for follow-up.  Since being on labetalol, symptoms of palpitations have essentially resolved.  Spironolactone/Aldactazide was discontinued due to  severe hyponatremia.  Benicar HCT was reduced to half tablet due to persistent hyponatremia on follow-up blood.  She now presents for follow-up and evaluation of hypertension and hyponatremia.  She does have chronic leg edema.  Suspect hyponatremia due to volume excess.  She has also forcefully increase her salt intake.  Her leg edema is worse compared to last time below the knee.  We discussed extensively regarding volume excess and hyponatremia and need for diuresis and chronic diastolic heart failure related to hypertension and obesity and diet.  I will restart furosemide one p.o. twice daily for about 1 to 2 weeks, salt and fluid restriction discussed, I would like to obtain BNP tomorrow and repeat BMP in 2 weeks to follow-up on hyponatremia, CHF and renal function.  I will check BMP in 2 weeks.    Adrian Prows, MD, [facility] 08/20/2019, 9:50 PM [facility] Pager: [phone] Office: [phone]

## 2019-08-20 ENCOUNTER — Encounter: Payer: Self-pay | Admitting: Cardiology

## 2019-08-20 ENCOUNTER — Other Ambulatory Visit: Payer: Self-pay

## 2019-08-20 ENCOUNTER — Ambulatory Visit: Payer: Medicare PPO | Admitting: Cardiology

## 2019-08-20 VITALS — BP 144/92 | HR 83 | Resp 15 | Ht 62.0 in | Wt 169.4 lb

## 2019-08-20 DIAGNOSIS — E871 Hypo-osmolality and hyponatremia: Secondary | ICD-10-CM

## 2019-08-20 DIAGNOSIS — I10 Essential (primary) hypertension: Secondary | ICD-10-CM

## 2019-08-20 DIAGNOSIS — I5032 Chronic diastolic (congestive) heart failure: Secondary | ICD-10-CM | POA: Diagnosis not present

## 2019-08-20 MED ORDER — FUROSEMIDE 20 MG PO TABS: 20.0000 mg | ORAL_TABLET | Freq: Two times a day (BID) | ORAL | 2 refills | Status: DC

## 2019-08-20 NOTE — Patient Instructions (Signed)
Lab work tomorrow and again in 1 week at [facility]

## 2019-08-21 DIAGNOSIS — E871 Hypo-osmolality and hyponatremia: Secondary | ICD-10-CM | POA: Diagnosis not present

## 2019-08-21 DIAGNOSIS — I5032 Chronic diastolic (congestive) heart failure: Secondary | ICD-10-CM | POA: Diagnosis not present

## 2019-08-22 LAB — BRAIN NATRIURETIC PEPTIDE: BNP: 42.8 pg/mL (ref 0.0–100.0)

## 2019-08-22 LAB — BASIC METABOLIC PANEL
BUN/Creatinine Ratio: 10 — ABNORMAL LOW (ref 12–28)
BUN: 8 mg/dL (ref 8–27)
CO2: 25 mmol/L (ref 20–29)
Calcium: 9.4 mg/dL (ref 8.7–10.3)
Chloride: 97 mmol/L (ref 96–106)
Creatinine, Ser: 0.77 mg/dL (ref 0.57–1.00)
GFR calc Af Amer: 86 mL/min/{1.73_m2} (ref >59)
GFR calc non Af Amer: 74 mL/min/{1.73_m2} (ref >59)
Glucose: 80 mg/dL (ref 65–99)
Potassium: 4 mmol/L (ref 3.5–5.2)
Sodium: 136 mmol/L (ref 134–144)

## 2019-08-23 NOTE — Progress Notes (Signed)
Normal labs including BMP and BNP.

## 2019-08-26 DIAGNOSIS — I10 Essential (primary) hypertension: Secondary | ICD-10-CM | POA: Diagnosis not present

## 2019-09-16 ENCOUNTER — Other Ambulatory Visit: Payer: Self-pay

## 2019-09-16 ENCOUNTER — Ambulatory Visit: Payer: Medicare PPO | Admitting: Cardiology

## 2019-09-16 ENCOUNTER — Encounter: Payer: Self-pay | Admitting: Cardiology

## 2019-09-16 VITALS — BP 135/66 | HR 77 | Resp 16 | Ht 62.0 in | Wt 164.8 lb

## 2019-09-16 DIAGNOSIS — I10 Essential (primary) hypertension: Secondary | ICD-10-CM

## 2019-09-16 DIAGNOSIS — E871 Hypo-osmolality and hyponatremia: Secondary | ICD-10-CM

## 2019-09-16 DIAGNOSIS — R600 Localized edema: Secondary | ICD-10-CM

## 2019-09-16 NOTE — Progress Notes (Signed)
Primary Physician/Referring:  Glendale Chard, MD  [name], female    DOB: 1940/05/31, 79 y.o.   MRN: [id number]  Chief Complaint  Patient presents with  . Hypertension  . Follow-up    4 WEEK   . Edema   HPI:    [name]  is a 79 y.o.  female with PMH significant for hypertension, prediabetes, hypercholesterolemia, rheumatoid arthritis, difficult to control hypertension and leg edema presents here for follow-up.    Overall she is feeling well and has noticed blood pressure to be significantly improved.  Since restricting excessive fluid and also salt intake, additional furosemide for diuresis, leg edema is improved and blood pressure is also been well controlled.  Past Medical History:  Diagnosis Date  . Arthritis    RA  . CAD (coronary artery disease)   . Fibroid, uterine   . Hypertension    Past Surgical History:  Procedure Laterality Date  . CARPAL TUNNEL RELEASE Right 12/20/2018  . CATARACT EXTRACTION, BILATERAL    . CHOLECYSTECTOMY    . TRIGGER FINGER RELEASE Right 12/20/2018   Family History  Problem Relation Age of Onset  . Heart disease Mother   . Dementia Mother   . Congestive Heart Failure Mother   . Parkinson's disease Father   . Pancreatic cancer Sister     Social History   Tobacco Use  . Smoking status: Never Smoker  . Smokeless tobacco: Never Used  Substance Use Topics  . Alcohol use: No    Alcohol/week: 0.0 standard drinks   Marital Status: Single ROS  Review of Systems  Cardiovascular: Positive for dyspnea on exertion and leg swelling. Negative for chest pain.  Gastrointestinal: Negative for melena.   Objective  Blood pressure 135/66, pulse 77, resp. rate 16, height 5\' 2"  (1.575 m), weight 164 lb 12.8 oz (74.8 kg), SpO2 99 %.  Vitals with BMI 09/16/2019 08/20/2019 08/20/2019  Height 5\' 2"  - 5\' 2"   Weight 164 lbs 13 oz - 169 lbs 6 oz  BMI 88.50 - 27.74  Systolic 128 786 767  Diastolic 66 92 94  Pulse 77 - 83      Physical Exam Constitutional:      Comments: Well built and mildly obese and appears younger than stated age  Cardiovascular:     Rate and Rhythm: Normal rate and regular rhythm.     Pulses: Intact distal pulses.          Dorsalis pedis pulses are 2+ on the right side and 2+ on the left side.       Posterior tibial pulses are 1+ on the right side and 1+ on the left side.     Heart sounds: Normal heart sounds. No murmur heard.  No gallop.      Comments: 1-2+ below knee leg edema, large adipose tissue present. No JVD. Pulmonary:     Effort: Pulmonary effort is normal.     Breath sounds: Normal breath sounds.  Abdominal:     General: Bowel sounds are normal.     Palpations: Abdomen is soft.     Comments: obese    Laboratory examination:   Recent Labs    08/06/19 1411 08/16/19 1430 08/21/19 1420  NA 127* 130* 136  K 3.9 3.9 4.0  CL 91* 95* 97  CO2 27 26 25   GLUCOSE 95 82 80  BUN 8 9 8   CREATININE 0.67 0.71 0.77  CALCIUM 9.3 9.0 9.4  GFRNONAA 84 82 74  GFRAA 97 94 86   CrCl cannot be calculated (Patient's most recent lab result is older than the maximum 21 days allowed.).  CMP Latest Ref Rng & Units 08/21/2019 08/16/2019 08/06/2019  Glucose 65 - 99 mg/dL 80 82 95  BUN 8 - 27 mg/dL 8 9 8   Creatinine 0.57 - 1.00 mg/dL 0.77 0.71 0.67  Sodium 134 - 144 mmol/L 136 130(L) 127(L)  Potassium 3.5 - 5.2 mmol/L 4.0 3.9 3.9  Chloride 96 - 106 mmol/L 97 95(L) 91(L)  CO2 20 - 29 mmol/L 25 26 27   Calcium 8.7 - 10.3 mg/dL 9.4 9.0 9.3  Total Protein 6.5 - 8.1 g/dL - - -  Total Bilirubin 0.3 - 1.2 mg/dL - - -  Alkaline Phos 38 - 126 U/L - - -  AST 15 - 41 U/L - - -  ALT 0 - 44 U/L - - -   CBC Latest Ref Rng & Units 06/07/2019 06/06/2019 06/05/2019  WBC 4.0 - 10.5 K/uL 4.0 3.6(L) 5.6  Hemoglobin 12.0 - 15.0 g/dL 13.1 14.5 13.2  Hematocrit 36 - 46 % 37.2 40.6 35.9(L)  Platelets 150 - 400 K/uL 177 191 197   Lipid Panel     Component Value Date/Time   CHOL 176 10/16/2018 0846    TRIG 44 10/16/2018 0846   HDL 88 10/16/2018 0846   CHOLHDL 2.0 10/16/2018 0846   LDLCALC 79 10/16/2018 0846   HEMOGLOBIN A1C Lab Results  Component Value Date   HGBA1C 5.3 07/01/2019   TSH Recent Labs    10/16/18 0846 06/04/19 1051  TSH 2.170 1.459   BNP (last 3 results) Recent Labs    06/04/19 1707 08/21/19 1420  BNP 34.2 42.8   External Labs:  06/18/2019: H/H 13/37, PLT 226, MCV 86. Rest of CBC normal.  Ast 20, ALT 13.    Medications and allergies  No Known Allergies   Current Outpatient Medications  Medication Instructions  . aspirin EC 81 mg, Daily  . furosemide (LASIX) 20 mg, Oral, 2 times daily at 10am and 4pm  . labetalol (NORMODYNE) 100 mg, Oral, 2 times daily  . mometasone (NASONEX) 50 MCG/ACT nasal spray 2 sprays, Nasal, Daily PRN  . olmesartan-hydrochlorothiazide (BENICAR HCT) 40-25 MG tablet 0.5 tablets, Oral, BH-each morning  . Vitamin D 2,000 Units, Oral, Daily    Radiology:  No results found.  Cardiac Studies:   Treadmill Stress 12/16/2013: Indications: Shortness of breath. Conclusions: Negative for ischemia. The patient exercised according to the Bruce protocol, Total time recorded 4 Min. 10 sec. achieving a max heart rate of 137 which was 92% of MPHR for age and 4.9 METS of work. The baseline ECG showed NSR,low voltage. During exercise there was no ST-T changes of ischemia. Symptoms: Dyspnea. Achieved 91% MPHR. Arrhythmia: None. Baseline NIBP was 132/82. Peak NIBP was 132/82 MaxSysp was: 174 MaxDiasp was: 90. Mild decrease in functional capacity. Continue primary prevention.  Echocardiogram 06/04/2019:  1. Left ventricular ejection fraction, by estimation, is 60 to 65%. The left ventricle has normal function. The left ventricle has no regional  wall motion abnormalities. Left ventricular diastolic parameters are consistent with Grade II diastolic  dysfunction (pseudonormalization).  2. Right ventricular systolic function is  normal. The right ventricular size is normal. There is normal pulmonary artery systolic pressure.  3. Left atrial size was mildly dilated.  4. Anterior fat pad noted in the pericardium.  5. The aortic valve is normal in structure. Aortic valve regurgitation is not visualized. Mild aortic valve sclerosis  is present, with no evidence of aortic valve stenosis.   EKG:  06/20/2019: Normal sinus rhythm with rate of 80 bpm, left atrial abnormality, left axis deviation, left intrafascicular block.  Poor R wave progression, cannot exclude anteroseptal infarct old.  No evidence of ischemia.  No significant change from 04/12/2019.  Assessment     ICD-10-CM   1. Primary hypertension  I10   2. Hyponatremia with excess extracellular fluid volume  E87.1   3. Leg edema  R60.0 Ambulatory referral to Interventional Radiology     No orders of the defined types were placed in this encounter.   There are no discontinued medications.  Recommendations:   Tina Ware  is a 79 y.o. AA female with PMH significant for hypertension, prediabetes, hypercholesterolemia, rheumatoid arthritis, hypertension and leg edema presents here for follow-up of hyponatremia and dyspnea.    Since being on labetalol, symptoms of palpitations have essentially resolved.  Spironolactone/Aldactazide was discontinued due to severe hyponatremia.  Benicar HCT was reduced to half tablet due to persistent hyponatremia on follow-up blood.    Suspect hyponatremia due to volume excess.  Since addition of furosemide and restricting the fluids, sodium levels are normalized.  Leg edema is also improved.  Since she still has leg edema and she has been compliant with diet and also wearing support stockings, I would also like to exclude venous insufficiency.  Will refer her to be evaluated by interventional radiology.  She is stable from cardiac standpoint, she needs to continuously watch her salt intake and excessive fluid intake and also try  to restrict her calories. She has been enrolled in  Remote Patient Monitoring and Principal Care Management. I will see her back in 6 months.     Adrian Prows, MD, [facility] 09/16/2019, 10:34 AM [facility] [place] Pager: [phone] Office: [phone]

## 2019-09-25 DIAGNOSIS — I10 Essential (primary) hypertension: Secondary | ICD-10-CM | POA: Diagnosis not present

## 2019-10-02 ENCOUNTER — Other Ambulatory Visit: Payer: Self-pay | Admitting: Cardiology

## 2019-10-02 DIAGNOSIS — I872 Venous insufficiency (chronic) (peripheral): Secondary | ICD-10-CM

## 2019-10-02 DIAGNOSIS — R601 Generalized edema: Secondary | ICD-10-CM

## 2019-10-16 ENCOUNTER — Ambulatory Visit
Admission: RE | Admit: 2019-10-16 | Discharge: 2019-10-16 | Disposition: A | Discharge status: Home / Self Care | Payer: Medicare PPO | Source: Ambulatory Visit | Attending: Cardiology | Admitting: Cardiology

## 2019-10-16 ENCOUNTER — Encounter: Payer: Medicare Other | Admitting: Internal Medicine

## 2019-10-16 DIAGNOSIS — R601 Generalized edema: Secondary | ICD-10-CM

## 2019-10-16 DIAGNOSIS — I872 Venous insufficiency (chronic) (peripheral): Secondary | ICD-10-CM

## 2019-10-16 DIAGNOSIS — M7989 Other specified soft tissue disorders: Secondary | ICD-10-CM | POA: Diagnosis not present

## 2019-10-16 DIAGNOSIS — R600 Localized edema: Secondary | ICD-10-CM | POA: Diagnosis not present

## 2019-10-16 NOTE — Progress Notes (Signed)
Lower Extremity Venous Duplex  10/16/2019: Normal lower extremity venous duplex without DVT or venous insufficiency.

## 2019-10-16 NOTE — Consult Note (Signed)
Chief Complaint: Patient was seen in consultation today for bilateral lower extremity swelling at the request of Ganji,Jay  Referring Physician(s): Ganji,Jay  History of Present Illness: Tina Ware is a 79 y.o. female present many years of bilateral lower extremity swelling.  No visible varicose veins.  No history of varicose/spider vein treatment.  No history of DVT, superficial thrombophlebitis, or PE.  Positive family history of spider/varicose veins in her sister and mother.  She is G1, P1 and did feel like the swelling increased after her pregnancies.  She is using only compression socks.  She is retired, but does sit/stand up to 3 hours/day.  She finds that leg elevation does improve her symptoms.  She does not require pain medications for her symptoms.  Past Medical History:  Diagnosis Date  . Arthritis    RA  . CAD (coronary artery disease)   . Fibroid, uterine   . Hypertension     Past Surgical History:  Procedure Laterality Date  . CARPAL TUNNEL RELEASE Right 12/20/2018  . CATARACT EXTRACTION, BILATERAL    . CHOLECYSTECTOMY    . TRIGGER FINGER RELEASE Right 12/20/2018    Allergies: Patient has no known allergies.  Medications: Prior to Admission medications   Medication Sig Start Date End Date Taking? Authorizing Provider  aspirin EC 81 MG tablet Take 81 mg by mouth daily.    [provider]  Cholecalciferol (VITAMIN D) 50 MCG (2000 UT) tablet Take 2,000 Units by mouth daily.     [provider]  furosemide (LASIX) 20 MG tablet Take 1 tablet (20 mg total) by mouth 2 (two) times daily at 10 am and 4 pm. 08/20/19 11/18/19  Adrian Prows, MD  labetalol (NORMODYNE) 100 MG tablet Take 1 tablet (100 mg total) by mouth 2 (two) times daily. 07/16/19 07/10/20  Adrian Prows, MD  mometasone (NASONEX) 50 MCG/ACT nasal spray PLACE 2 SPRAYS INTO THE NOSE DAILY AS NEEDED (ALLERGIES). 05/14/19   Glendale Chard, MD  olmesartan-hydrochlorothiazide (BENICAR HCT)  40-25 MG tablet Take 0.5 tablets by mouth every morning. 08/07/19   Adrian Prows, MD     Family History  Problem Relation Age of Onset  . Heart disease Mother   . Dementia Mother   . Congestive Heart Failure Mother   . Parkinson's disease Father   . Pancreatic cancer Sister     Social History   Socioeconomic History  . Marital status: Single    Spouse name: Not on file  . Number of children: 1  . Years of education: Not on file  . Highest education level: Not on file  Occupational History  . Not on file  Tobacco Use  . Smoking status: Never Smoker  . Smokeless tobacco: Never Used  Vaping Use  . Vaping Use: Never used  Substance and Sexual Activity  . Alcohol use: No    Alcohol/week: 0.0 standard drinks  . Drug use: No  . Sexual activity: Not Currently  Other Topics Concern  . Not on file  Social History Narrative  . Not on file   Social Determinants of Health   Financial Resource Strain:   . Difficulty of Paying Living Expenses:   Food Insecurity:   . Worried About Charity fundraiser in the Last Year:   . Arboriculturist in the Last Year:   Transportation Needs:   . Film/video editor (Medical):   Marland Kitchen Lack of Transportation (Non-Medical):   Physical Activity: Insufficiently Active  . Days of  Exercise per Week: 3 days  . Minutes of Exercise per Session: 10 min  Stress:   . Feeling of Stress :   Social Connections:   . Frequency of Communication with Friends and Family:   . Frequency of Social Gatherings with Friends and Family:   . Attends Religious Services:   . Active Member of Clubs or Organizations:   . Attends Archivist Meetings:   Marland Kitchen Marital Status:     ECOG Status: 1 - Symptomatic but completely ambulatory  Review of Systems: A 12 point ROS discussed and pertinent positives are indicated in the HPI above.  All other systems are negative.  Review of Systems  Vital Signs: There were no vitals taken for this visit.  Physical  Exam  Mallampati Score:     Imaging: US Venous Img Lower Bilateral (DVT)  Result Date: 10/16/2019 CLINICAL DATA:  Bilateral lower extremity swelling/edema. EXAM: BILATERAL LOWER EXTREMITY VENOUS DUPLEX ULTRASOUND TECHNIQUE: Gray-scale sonography with graded compression, as well as color Doppler and duplex ultrasound, were performed to evaluate the deep and superficial veins of both lower extremities. Spectral Doppler was utilized to evaluate flow at rest and with distal augmentation maneuvers. A complete superficial venous insufficiency exam was performed in the upright standing position. I personally performed the technical portion of the exam. COMPARISON:  None. FINDINGS: Deep Venous System: Evaluation of the deep venous system including the common femoral, femoral, profunda femoral, popliteal and calf veins (where visible) demonstrate no evidence of deep venous thrombosis. The vessels are compressible and demonstrate normal respiratory phasicity and response to augmentation. No evidence of the deep venous reflux. Superficial Venous System: RIGHT SFJ: Widely patent, no reflux post augmentation GSV: Normal in caliber throughout, without dilatation. No refluxing segment post augmentation. No visible associated varicose veins. No segmental thrombosis or occlusion. SSV: Patent, normal in caliber throughout, without dilatation. No refluxing segment post augmentation. LEFT SFJ: Widely patent, no reflux post augmentation GSV: Normal in caliber throughout, without dilatation. No refluxing segment post augmentation. No visible associated varicose veins. No segmental thrombosis or occlusion. SSV: Patent, normal in caliber throughout, without dilatation. No refluxing segment post augmentation. Other: Subcutaneous edema in the distal calfs bilaterally IMPRESSION: 1. Negative for acute or chronic lower extremity DVT. 2. Negative for bilateral lower extremity saphenous venous valvular incompetence, reflux, or  significant varicose vein disease. Electronically Signed   By: Lucrezia Europe M.D.   On: 10/16/2019 13:29   [place] RAD EVAL AND MGMT  Result Date: 10/16/2019 Please refer to "Notes" to see consult details.   Labs:  CBC: Recent Labs    06/04/19 1051 06/05/19 0539 06/06/19 0637 06/07/19 0635  WBC 6.3 5.6 3.6* 4.0  HGB 13.5 13.2 14.5 13.1  HCT 38.4 35.9* 40.6 37.2  PLT 200 197 191 177    COAGS: No results for input(s): INR, APTT in the last 8760 hours.  BMP: Recent Labs    07/01/19 1530 08/06/19 1411 08/16/19 1430 08/21/19 1420  NA 135 127* 130* 136  K 3.8 3.9 3.9 4.0  CL 98 91* 95* 97  CO2 25 27 26 25   GLUCOSE 80 95 82 80  BUN 8 8 9 8   CALCIUM 9.3 9.3 9.0 9.4  CREATININE 0.68 0.67 0.71 0.77  GFRNONAA 84 84 82 74  GFRAA 97 97 94 86    LIVER FUNCTION TESTS: Recent Labs    06/04/19 1051 06/05/19 0539  BILITOT 1.0 1.1  AST 22 21  ALT 17 15  ALKPHOS 49 52  PROT 5.5* 5.1*  ALBUMIN 3.1* 2.7*    TUMOR MARKERS: No results for input(s): AFPTM, CEA, CA199, CHROMGRNA in the last 8760 hours.  Assessment and Plan:  My impression is that this patient has no lower extremity acute or chronic DVT, nor any evidence of saphenous venous valvular incompetence, reflux, or varicose veins as a possible etiology or component of her lower extremity swelling.  No indication for venous intervention at this time.  I did recommend graduated compression hose for symptomatic relief if desired.  Thank you for this interesting consult.  I greatly enjoyed meeting [place] and look forward to participating in their care.  A copy of this report was sent to the requesting provider on this date.  Electronically Signed: Rickard Rhymes 10/16/2019, 1:35 PM   I spent a total of  40 Minutes   in face to face in clinical consultation, greater than 50% of which was counseling/coordinating care for bilateral lower extremity swelling.

## 2019-10-26 DIAGNOSIS — I10 Essential (primary) hypertension: Secondary | ICD-10-CM | POA: Diagnosis not present

## 2019-10-27 ENCOUNTER — Other Ambulatory Visit: Payer: Self-pay | Admitting: Internal Medicine

## 2019-11-06 ENCOUNTER — Telehealth: Payer: Self-pay | Admitting: Pharmacist

## 2019-11-06 NOTE — Telephone Encounter (Signed)
Pt called requesting to reduce her current lasix dose to PRN. Pt recently saw interventional radiology and was told that she doesn't have any noted venous insufficiency concerns. Pt states that her swelling symptoms have improved since last OV. Pt states that she feels frustrated with the increased urinary frequency associated with BID dosing and would like to take it PRN as her lower extremity symptoms are back to baseline, per pt. Reviewed pt request with Dr. Einar Gip. Per Dr. Einar Gip, okay to reduce the lasix dose to PRN. Reviewed with pt and pt verbalized understanding. Updated med list

## 2019-11-15 NOTE — Progress Notes (Deleted)
Office Visit Note  Patient: Tina Ware             Date of Birth: [birth year]           MRN: [id number]             PCP: Glendale Chard, MD Referring: Glendale Chard, MD Visit Date: 11/28/2019 Occupation: @GUAROCC @  Subjective:  No chief complaint on file.   History of Present Illness: Tina Ware is a 79 y.o. female ***   Activities of Daily Living:  Patient reports morning stiffness for *** {minute/hour:19697}.   Patient {ACTIONS;DENIES/REPORTS:21021675::"Denies"} nocturnal pain.  Difficulty dressing/grooming: {ACTIONS;DENIES/REPORTS:21021675::"Denies"} Difficulty climbing stairs: {ACTIONS;DENIES/REPORTS:21021675::"Denies"} Difficulty getting out of chair: {ACTIONS;DENIES/REPORTS:21021675::"Denies"} Difficulty using hands for taps, buttons, cutlery, and/or writing: {ACTIONS;DENIES/REPORTS:21021675::"Denies"}  No Rheumatology ROS completed.   PMFS History:  Patient Active Problem List   Diagnosis Date Noted  . Syncope 06/04/2019  . Scalp hematoma 06/04/2019  . Blood in stool 06/04/2019  . Status post trigger finger release 01/24/2019  . Carpal tunnel syndrome, right upper limb 12/27/2018  . Bilateral leg edema 02/12/2018  . Essential hypertension 02/12/2018  . Chronic pain of right knee 02/12/2018  . Hyponatremia 05/03/2017  . Abdominal pain 05/03/2017    Past Medical History:  Diagnosis Date  . Arthritis    RA  . CAD (coronary artery disease)   . Fibroid, uterine   . Hypertension     Family History  Problem Relation Age of Onset  . Heart disease Mother   . Dementia Mother   . Congestive Heart Failure Mother   . Parkinson's disease Father   . Pancreatic cancer Sister    Past Surgical History:  Procedure Laterality Date  . CARPAL TUNNEL RELEASE Right 12/20/2018  . CATARACT EXTRACTION, BILATERAL    . CHOLECYSTECTOMY    . TRIGGER FINGER RELEASE Right 12/20/2018   Social History   Social History Narrative  . Not on file   Immunization  History  Administered Date(s) Administered  . Influenza, High Dose Seasonal PF 01/15/2018, 01/09/2019  . Moderna SARS-COVID-2 Vaccination 04/21/2019, 05/19/2019  . Pneumococcal Conjugate-13 12/13/2018  . Tdap 07/06/2017  . Zoster Recombinat (Shingrix) 11/09/2018, 02/28/2019, 03/26/2019     Objective: Vital Signs: There were no vitals taken for this visit.   Physical Exam   Musculoskeletal Exam: ***  CDAI Exam: CDAI Score: -- Patient Global: --; Provider Global: -- Swollen: --; Tender: -- Joint Exam 11/28/2019   No joint exam has been documented for this visit   There is currently no information documented on the homunculus. Go to the Rheumatology activity and complete the homunculus joint exam.  Investigation: No additional findings.  Imaging: No results found.  Recent Labs: Lab Results  Component Value Date   WBC 4.0 06/07/2019   HGB 13.1 06/07/2019   PLT 177 06/07/2019   NA 136 08/21/2019   K 4.0 08/21/2019   CL 97 08/21/2019   CO2 25 08/21/2019   GLUCOSE 80 08/21/2019   BUN 8 08/21/2019   CREATININE 0.77 08/21/2019   BILITOT 1.1 06/05/2019   ALKPHOS 52 06/05/2019   AST 21 06/05/2019   ALT 15 06/05/2019   PROT 5.1 (L) 06/05/2019   ALBUMIN 2.7 (L) 06/05/2019   CALCIUM 9.4 08/21/2019   GFRAA 86 08/21/2019    Speciality Comments: No specialty comments available.  Procedures:  No procedures performed Allergies: Patient has no known allergies.   Assessment / Plan:     Visit Diagnoses: No diagnosis found.  Orders: No orders of the  defined types were placed in this encounter.  No orders of the defined types were placed in this encounter.   Face-to-face time spent with patient was *** minutes. Greater than 50% of time was spent in counseling and coordination of care.  Follow-Up Instructions: No follow-ups on file.   Earnestine Mealing, CMA  Note - This record has been created using Editor, commissioning.  Chart creation errors have been sought, but may  not always  have been located. Such creation errors do not reflect on  the standard of medical care.

## 2019-11-21 ENCOUNTER — Other Ambulatory Visit: Payer: Self-pay | Admitting: Cardiology

## 2019-11-21 DIAGNOSIS — I10 Essential (primary) hypertension: Secondary | ICD-10-CM

## 2019-11-21 DIAGNOSIS — E871 Hypo-osmolality and hyponatremia: Secondary | ICD-10-CM

## 2019-11-21 NOTE — Telephone Encounter (Signed)
Is the patient supposed to be taking Furosemide 2 times daily? Please advise.

## 2019-11-21 NOTE — Telephone Encounter (Signed)
Per 8/11 telephone note, pt's dose was decreased to daily PRN.

## 2019-11-26 DIAGNOSIS — I10 Essential (primary) hypertension: Secondary | ICD-10-CM | POA: Diagnosis not present

## 2019-11-28 ENCOUNTER — Ambulatory Visit: Payer: Medicare PPO | Admitting: Physician Assistant

## 2019-11-28 ENCOUNTER — Other Ambulatory Visit: Payer: Self-pay | Admitting: Internal Medicine

## 2019-11-28 DIAGNOSIS — M65331 Trigger finger, right middle finger: Secondary | ICD-10-CM

## 2019-11-28 DIAGNOSIS — M8589 Other specified disorders of bone density and structure, multiple sites: Secondary | ICD-10-CM

## 2019-11-28 DIAGNOSIS — I10 Essential (primary) hypertension: Secondary | ICD-10-CM

## 2019-11-28 DIAGNOSIS — M1711 Unilateral primary osteoarthritis, right knee: Secondary | ICD-10-CM

## 2019-11-28 DIAGNOSIS — M19041 Primary osteoarthritis, right hand: Secondary | ICD-10-CM

## 2019-11-28 DIAGNOSIS — R768 Other specified abnormal immunological findings in serum: Secondary | ICD-10-CM

## 2019-11-28 DIAGNOSIS — G5603 Carpal tunnel syndrome, bilateral upper limbs: Secondary | ICD-10-CM

## 2019-12-03 NOTE — Progress Notes (Signed)
Office Visit Note  Patient: Tina Ware             Date of Birth: 19-Dec-1940           MRN: [id number]             PCP: Glendale Chard, MD Referring: Glendale Chard, MD Visit Date: 12/17/2019 Occupation: _0 @  Subjective:  Bilateral hand numbness   History of Present Illness: Tina Ware is a 79 y.o. female with history of osteoarthritis.  Patient presents today with numbness of both hands.  She had a right carpal tunnel release performed by Dr. Durward Fortes in September 2020.  After surgery she went to physical therapy and her symptoms had improved significantly.  Over the past several months the paresthesias in both hands have progressively been worsening.  She has started wearing the night splints again but has not noticed much improvement.  She states her numbness is exacerbated by holding the steering well while driving.  She denies any joint pain but has noticed increased stiffness and intermittent joint swelling.  She has incomplete fist formation bilaterally.  She is aware that she had a positive rheumatoid factor in the past but denies any family history of rheumatoid arthritis.  She denies any other joint pain or joint swelling at this time.     Activities of Daily Living:  Patient reports morning stiffness for a few  minutes.   Patient Denies nocturnal pain.  Difficulty dressing/grooming: Denies Difficulty climbing stairs: Denies Difficulty getting out of chair: Denies Difficulty using hands for taps, buttons, cutlery, and/or writing: Reports  Review of Systems  Constitutional: Positive for fatigue.  HENT: Positive for mouth dryness. Negative for mouth sores and nose dryness.   Eyes: Negative for pain, itching and dryness.  Respiratory: Negative for shortness of breath and difficulty breathing.   Cardiovascular: Negative for chest pain and palpitations.  Gastrointestinal: Positive for abdominal pain. Negative for blood in stool, constipation and diarrhea.    Endocrine: Negative for increased urination.  Genitourinary: Negative for difficulty urinating.  Musculoskeletal: Positive for arthralgias, joint pain, joint swelling and morning stiffness. Negative for myalgias, muscle tenderness and myalgias.  Skin: Negative for color change, rash and redness.  Allergic/Immunologic: Negative for susceptible to infections.  Neurological: Positive for numbness. Negative for dizziness, headaches, memory loss and weakness.  Hematological: Negative for bruising/bleeding tendency.  Psychiatric/Behavioral: Negative for confusion and sleep disturbance.    PMFS History:  Patient Active Problem List   Diagnosis Date Noted  . Syncope 06/04/2019  . Scalp hematoma 06/04/2019  . Blood in stool 06/04/2019  . Status post trigger finger release 01/24/2019  . Carpal tunnel syndrome, right upper limb 12/27/2018  . Bilateral leg edema 02/12/2018  . Essential hypertension 02/12/2018  . Chronic pain of right knee 02/12/2018  . Hyponatremia 05/03/2017  . Abdominal pain 05/03/2017    Past Medical History:  Diagnosis Date  . Arthritis    RA  . CAD (coronary artery disease)   . Fibroid, uterine   . Hypertension     Family History  Problem Relation Age of Onset  . Heart disease Mother   . Dementia Mother   . Congestive Heart Failure Mother   . Parkinson's disease Father   . Pancreatic cancer Sister    Past Surgical History:  Procedure Laterality Date  . CARPAL TUNNEL RELEASE Right 12/20/2018  . CATARACT EXTRACTION, BILATERAL    . CHOLECYSTECTOMY    . TRIGGER FINGER RELEASE Right 12/20/2018   Social History  Social History Narrative  . Not on file   Immunization History  Administered Date(s) Administered  . Influenza, High Dose Seasonal PF 01/15/2018, 01/09/2019  . Moderna SARS-COVID-2 Vaccination 04/21/2019, 05/19/2019  . Pneumococcal Conjugate-13 12/13/2018  . Tdap 07/06/2017  . Zoster Recombinat (Shingrix) 11/09/2018, 02/28/2019, 03/26/2019      Objective: Vital Signs: BP 128/80 (BP Location: Right Arm, Patient Position: Sitting, Cuff Size: Normal)   Pulse 66   Resp 15   Ht 5' 2" (1.575 m)   Wt 171 lb 6.4 oz (77.7 kg)   BMI 31.35 kg/m    Physical Exam Vitals and nursing note reviewed.  Constitutional:      Appearance: She is well-developed.  HENT:     Head: Normocephalic and atraumatic.  Eyes:     Conjunctiva/sclera: Conjunctivae normal.  Pulmonary:     Effort: Pulmonary effort is normal.  Abdominal:     Palpations: Abdomen is soft.  Musculoskeletal:     Cervical back: Normal range of motion.  Skin:    General: Skin is warm and dry.     Capillary Refill: Capillary refill takes less than 2 seconds.  Neurological:     Mental Status: She is alert and oriented to person, place, and time.  Psychiatric:        Behavior: Behavior normal.      Musculoskeletal Exam: C-spine, thoracic spine, and lumbar spine good ROM. No midline spinal tenderness.  No SI joint tenderness. Shoulder joints, elbow joints, and wrist joints good ROM with on tenderness. Flexion contracture of right 3rd PIP joint.  Contracture of left 1st PIP joint.  PIP and DIP thickening consistent with osteoarthritis of both hands.  Tenderness of both wrist joints and all MCP joints. Hip joints, knee joints, and ankle joints good ROM with no discomfort.  No warmth or effusion of knee joints.  No tenderness or swelling of ankle joints.   CDAI Exam: CDAI Score: -- Patient Global: --; Provider Global: -- Swollen: --; Tender: -- Joint Exam 12/17/2019   No joint exam has been documented for this visit   There is currently no information documented on the homunculus. Go to the Rheumatology activity and complete the homunculus joint exam.  Investigation: No additional findings.  Imaging: No results found.  Recent Labs: Lab Results  Component Value Date   WBC 4.0 06/07/2019   HGB 13.1 06/07/2019   PLT 177 06/07/2019   NA 136 08/21/2019   K 4.0  08/21/2019   CL 97 08/21/2019   CO2 25 08/21/2019   GLUCOSE 80 08/21/2019   BUN 8 08/21/2019   CREATININE 0.77 08/21/2019   BILITOT 1.1 06/05/2019   ALKPHOS 52 06/05/2019   AST 21 06/05/2019   ALT 15 06/05/2019   PROT 5.1 (L) 06/05/2019   ALBUMIN 2.7 (L) 06/05/2019   CALCIUM 9.4 08/21/2019   GFRAA 86 08/21/2019    Speciality Comments: No specialty comments available.  Procedures:  No procedures performed Allergies: Patient has no known allergies.   Assessment / Plan:     Visit Diagnoses: Primary osteoarthritis of both hands: She has PIP and DIP thickening consistent with osteoarthritis of both hands.  She has incomplete fist formation bilaterally.  No tenderness of PIP or DIP joints.  Contracture of the right third PIP joint noted status post trigger finger release in September 2021.  She has some tenderness over all MCP joints but no synovitis was noted.  Her main concern is paresthesias of both hands.  She had NCV with EMG performed  on 10/25/2018 which revealed very severe right median nerve entrapment and severe left median nerve entrapment.  She was scheduled for right carpal tunnel release by Dr. Durward Fortes in September 2020 followed by physical therapy.  Her symptoms had improved after surgical release as well as physical therapy but over the past several months she has noticed progressively worsening paresthesias in both hands.  She has started to wear carpal tunnel splints at night bilaterally but has not noticed much improvement.  X-rays of both hands were updated today.  She does have a history of positive rheumatoid factor but no family history of rheumatoid arthritis.  We will schedule an ultrasound of both hands to assess for synovitis as well as for bilateral carpal tunnel cortisone injections.  Joint protection and muscle strengthening were discussed. She will follow up in 6 months for routine office visit.   Rheumatoid factor positive -Rheumatoid factor was 61.9 on 06/18/2018.   Anti-CCP was negative and ESR was within normal limits at that time.  She has been experiencing increased pain and paresthesias in both hands over the past several months.  She has incomplete fist formation bilaterally.  No obvious joint synovitis was noted on exam.  She does have tenderness of MCP joints but no inflammation was noted.  X-rays of both hands were updated today and we will repeat the following lab work to complete the work-up.  If she continues to have persistent pain and stiffness we will perform an ultrasound of both hands to assess for synovitis.  She will be scheduled for an ultrasound for bilateral carpal tunnel cortisone injections.  Plan: XR Hand 2 View Left, XR Hand 2 View Right, 14-3-3 eta Protein, Cyclic citrul peptide antibody, IgG, Rheumatoid factor, Sedimentation rate  Trigger finger, right middle finger: Surgical release performed by Dr. Durward Fortes Sept 2020.  Flexion contracture of right 3rd PIP joint noted.   Paresthesia of both hands: She has been experiencing severe and constant paresthesias in both hands over the past several months.  She had NCV with EMG on 10/25/2018 which revealed very severe right median nerve entrapment and severe left median nerve entrapment.  She underwent right carpal tunnel release performed by Dr. With her in September 2020 followed by physical therapy which improved her symptoms.  Her symptoms have started to return and progress over the past several months.  No obvious muscle weakness was noted.  She is having difficulty making complete fist due to stiffness in both hands.  She has no obvious synovitis on exam.  She does have tenderness of MCP joints.  Due to her history of positive rheumatoid factor we will update x-rays of both hands and obtain updated lab work.  She will also be scheduled for ultrasound-guided cortisone injections for symptomatic relief.  She does not want to proceed with surgical release at this time.  We discussed that if her  symptoms persist or worsen after the cortisone injection she will need to follow-up with Dr. Durward Fortes to further discuss treatment options.   S/P carpal tunnel release: Right-Performed by Dr. Durward Fortes Sept 2020.  She underwent physical therapy postoperatively.  She has started to have increased pain and paresthesias in both hands over the past several months.  She has started to have nocturnal symptoms and restarted wearing carpal tunnel night splints. She will be scheduled for ultrasound guided cortisone injection. She does not want to proceed with surgery or follow up with Dr. Durward Fortes at this time.   Carpal tunnel syndrome, left upper limb: Severe  evident on NCV with EMG performed on 10/25/18.  She does not want to proceed with surgery at this time. She would like to schedule an ultrasound guided cortisone injection.  She was encouraged to wear the carpal tunnel night splint at bedtime.    Primary osteoarthritis of right knee: She has good ROM with no discomfort.  No warmth or effusion noted.   Osteopenia of multiple sites - DEXA on 03/26/18: right femoral neck BMD 0.628 with T-score -2.3 with -9% change in BMD in left femoral neck.  She is taking vitamin D 2,000 units daily. She is due to update DEXA in December 2021, which will be ordered by her PCP.    Essential hypertension: BP was 128/80    Orders: Orders Placed This Encounter  Procedures  . XR Hand 2 View Left  . XR Hand 2 View Right  . 14-3-3 eta Protein  . Cyclic citrul peptide antibody, IgG  . Rheumatoid factor  . Sedimentation rate   No orders of the defined types were placed in this encounter.     Follow-Up Instructions: Return in about 6 months (around 06/15/2020) for Osteoarthritis.   Ofilia Neas, PA-C  Note - This record has been created using Dragon software.  Chart creation errors have been sought, but may not always  have been located. Such creation errors do not reflect on  the standard of medical care.,

## 2019-12-04 DIAGNOSIS — K645 Perianal venous thrombosis: Secondary | ICD-10-CM | POA: Diagnosis not present

## 2019-12-17 ENCOUNTER — Telehealth: Payer: Self-pay

## 2019-12-17 ENCOUNTER — Ambulatory Visit: Payer: Self-pay

## 2019-12-17 ENCOUNTER — Other Ambulatory Visit: Payer: Self-pay

## 2019-12-17 ENCOUNTER — Encounter: Payer: Self-pay | Admitting: Physician Assistant

## 2019-12-17 ENCOUNTER — Ambulatory Visit: Payer: Medicare PPO | Admitting: Physician Assistant

## 2019-12-17 VITALS — BP 128/80 | HR 66 | Resp 15 | Ht 62.0 in | Wt 171.4 lb

## 2019-12-17 DIAGNOSIS — M1711 Unilateral primary osteoarthritis, right knee: Secondary | ICD-10-CM

## 2019-12-17 DIAGNOSIS — M65331 Trigger finger, right middle finger: Secondary | ICD-10-CM | POA: Diagnosis not present

## 2019-12-17 DIAGNOSIS — I10 Essential (primary) hypertension: Secondary | ICD-10-CM

## 2019-12-17 DIAGNOSIS — M19042 Primary osteoarthritis, left hand: Secondary | ICD-10-CM

## 2019-12-17 DIAGNOSIS — M19041 Primary osteoarthritis, right hand: Secondary | ICD-10-CM

## 2019-12-17 DIAGNOSIS — Z9889 Other specified postprocedural states: Secondary | ICD-10-CM | POA: Diagnosis not present

## 2019-12-17 DIAGNOSIS — G5603 Carpal tunnel syndrome, bilateral upper limbs: Secondary | ICD-10-CM | POA: Diagnosis not present

## 2019-12-17 DIAGNOSIS — M8589 Other specified disorders of bone density and structure, multiple sites: Secondary | ICD-10-CM

## 2019-12-17 DIAGNOSIS — G5602 Carpal tunnel syndrome, left upper limb: Secondary | ICD-10-CM

## 2019-12-17 DIAGNOSIS — R768 Other specified abnormal immunological findings in serum: Secondary | ICD-10-CM

## 2019-12-17 DIAGNOSIS — R202 Paresthesia of skin: Secondary | ICD-10-CM | POA: Diagnosis not present

## 2019-12-17 NOTE — Telephone Encounter (Signed)
Attempted to contact patient and left message on machine to advise patient of bilateral hand xray results from today (12/17/2019).

## 2019-12-20 ENCOUNTER — Ambulatory Visit (INDEPENDENT_AMBULATORY_CARE_PROVIDER_SITE_OTHER): Payer: Medicare PPO | Admitting: Rheumatology

## 2019-12-20 ENCOUNTER — Other Ambulatory Visit: Payer: Self-pay

## 2019-12-20 DIAGNOSIS — G5602 Carpal tunnel syndrome, left upper limb: Secondary | ICD-10-CM | POA: Diagnosis not present

## 2019-12-20 MED ORDER — TRIAMCINOLONE ACETONIDE 40 MG/ML IJ SUSP
10.0000 mg | INTRAMUSCULAR | Status: AC | PRN
  Administered 2019-12-20: 10 mg

## 2019-12-20 MED ORDER — LIDOCAINE HCL 1 % IJ SOLN
0.5000 mL | INTRAMUSCULAR | Status: AC | PRN
  Administered 2019-12-20: .5 mL

## 2019-12-20 NOTE — Progress Notes (Signed)
   Procedure Note  Patient: Tina Ware             Date of Birth: 1941-01-17           MRN: [id number]             Visit Date: 12/20/2019  Procedures: Visit Diagnoses:  1. Carpal tunnel syndrome, left upper limb     Hand/UE Inj: L carpal tunnel for carpal tunnel syndrome on 12/20/2019 10:03 AM Indications: pain Details: 27 G needle, ultrasound-guided ulnar approach Medications: 0.5 mL lidocaine 1 %; 10 mg triamcinolone acetonide 40 MG/ML Aspirate: 0 mL Outcome: tolerated well, no immediate complications Procedure, treatment alternatives, risks and benefits explained, specific risks discussed. Consent was given by the patient. Immediately prior to procedure a time out was called to verify the correct patient, procedure, equipment, support staff and site/side marked as required. Patient was prepped and draped in the usual sterile fashion.     Post procedure instructions were given.  Bo Merino, MD

## 2019-12-26 ENCOUNTER — Other Ambulatory Visit: Payer: Self-pay | Admitting: Internal Medicine

## 2019-12-26 DIAGNOSIS — I10 Essential (primary) hypertension: Secondary | ICD-10-CM | POA: Diagnosis not present

## 2019-12-31 ENCOUNTER — Encounter: Payer: Medicare Other | Admitting: Internal Medicine

## 2019-12-31 LAB — 14-3-3 ETA PROTEIN: 14-3-3 eta Protein: <0.2 ng/mL (ref <0.2)

## 2019-12-31 LAB — SEDIMENTATION RATE: Sed Rate: 2 mm/h (ref 0–30)

## 2019-12-31 LAB — CYCLIC CITRUL PEPTIDE ANTIBODY, IGG: Cyclic Citrullin Peptide Ab: <16 UNITS

## 2019-12-31 LAB — RHEUMATOID FACTOR: Rheumatoid fact SerPl-aCnc: 29 IU/mL — ABNORMAL HIGH (ref <14)

## 2019-12-31 NOTE — Progress Notes (Signed)
RF remains positive but titer has trended down. 14-3-3 eta negative. Anti-CCP negative. ESR WNL.

## 2020-01-01 ENCOUNTER — Encounter: Payer: Self-pay | Admitting: Internal Medicine

## 2020-01-01 ENCOUNTER — Ambulatory Visit (INDEPENDENT_AMBULATORY_CARE_PROVIDER_SITE_OTHER): Payer: Medicare PPO

## 2020-01-01 ENCOUNTER — Other Ambulatory Visit: Payer: Self-pay

## 2020-01-01 ENCOUNTER — Ambulatory Visit: Payer: Medicare PPO | Admitting: Internal Medicine

## 2020-01-01 VITALS — BP 132/86 | HR 76 | Temp 98.1°F | Ht 61.8 in | Wt 166.6 lb

## 2020-01-01 DIAGNOSIS — H6123 Impacted cerumen, bilateral: Secondary | ICD-10-CM

## 2020-01-01 DIAGNOSIS — Z0000 Encounter for general adult medical examination without abnormal findings: Secondary | ICD-10-CM

## 2020-01-01 DIAGNOSIS — Z1159 Encounter for screening for other viral diseases: Secondary | ICD-10-CM

## 2020-01-01 DIAGNOSIS — Z23 Encounter for immunization: Secondary | ICD-10-CM

## 2020-01-01 DIAGNOSIS — Z6830 Body mass index (BMI) 30.0-30.9, adult: Secondary | ICD-10-CM

## 2020-01-01 DIAGNOSIS — E6609 Other obesity due to excess calories: Secondary | ICD-10-CM

## 2020-01-01 DIAGNOSIS — R7309 Other abnormal glucose: Secondary | ICD-10-CM | POA: Diagnosis not present

## 2020-01-01 DIAGNOSIS — K5909 Other constipation: Secondary | ICD-10-CM

## 2020-01-01 DIAGNOSIS — I10 Essential (primary) hypertension: Secondary | ICD-10-CM

## 2020-01-01 DIAGNOSIS — K644 Residual hemorrhoidal skin tags: Secondary | ICD-10-CM

## 2020-01-01 DIAGNOSIS — E559 Vitamin D deficiency, unspecified: Secondary | ICD-10-CM

## 2020-01-01 LAB — POCT URINALYSIS DIPSTICK
Bilirubin, UA: NEGATIVE
Blood, UA: NEGATIVE
Glucose, UA: NEGATIVE
Ketones, UA: NEGATIVE
Nitrite, UA: NEGATIVE
Protein, UA: NEGATIVE
Spec Grav, UA: 1.025 (ref 1.010–1.025)
Urobilinogen, UA: 0.2 E.U./dL
pH, UA: 6 (ref 5.0–8.0)

## 2020-01-01 LAB — POCT UA - MICROALBUMIN
Albumin/Creatinine Ratio, Urine, POC: <30
Creatinine, POC: 200 mg/dL
Microalbumin Ur, POC: 30 mg/L

## 2020-01-01 MED ORDER — HYDROCORTISONE ACETATE 25 MG RE SUPP: 25.0000 mg | Freq: Two times a day (BID) | RECTAL | 0 refills | Status: DC

## 2020-01-01 NOTE — Patient Instructions (Signed)
Tina Ware , Thank you for taking time to come for your Medicare Wellness Visit. I appreciate your ongoing commitment to your health goals. Please review the following plan we discussed and let me know if I can assist you in the future.   Screening recommendations/referrals: Colonoscopy: completed 03/20/2019 Mammogram: completed 03/28/2019 Bone Density: completed 03/26/2018 Recommended yearly ophthalmology/optometry visit for glaucoma screening and checkup Recommended yearly dental visit for hygiene and checkup  Vaccinations: Influenza vaccine: 2021 Pneumococcal vaccine: completed 12/13/2018 Tdap vaccine: completed 07/06/2017 Shingles vaccine: discussed   Covid-19: 05/19/2019, 04/21/2019  Advanced directives: Advance directive discussed with you today. I have provided a copy for you to complete at home and have notarized. Once this is complete please bring a copy in to our office so we can scan it into your chart.  Conditions/risks identified: none  Next appointment:07/01/2020 at 11:00  Follow up in one year for your annual wellness visit    Preventive Care 65 Years and Older, Female Preventive care refers to lifestyle choices and visits with your health care provider that can promote health and wellness. What does preventive care include?  A yearly physical exam. This is also called an annual well check.  Dental exams once or twice a year.  Routine eye exams. Ask your health care provider how often you should have your eyes checked.  Personal lifestyle choices, including:  Daily care of your teeth and gums.  Regular physical activity.  Eating a healthy diet.  Avoiding tobacco and drug use.  Limiting alcohol use.  Practicing safe sex.  Taking low-dose aspirin every day.  Taking vitamin and mineral supplements as recommended by your health care provider. What happens during an annual well check? The services and screenings done by your health care provider during your  annual well check will depend on your age, overall health, lifestyle risk factors, and family history of disease. Counseling  Your health care provider may ask you questions about your:  Alcohol use.  Tobacco use.  Drug use.  Emotional well-being.  Home and relationship well-being.  Sexual activity.  Eating habits.  History of falls.  Memory and ability to understand (cognition).  Work and work Statistician.  Reproductive health. Screening  You may have the following tests or measurements:  Height, weight, and BMI.  Blood pressure.  Lipid and cholesterol levels. These may be checked every 5 years, or more frequently if you are over 38 years old.  Skin check.  Lung cancer screening. You may have this screening every year starting at age 10 if you have a 30-pack-year history of smoking and currently smoke or have quit within the past 15 years.  Fecal occult blood test (FOBT) of the stool. You may have this test every year starting at age 68.  Flexible sigmoidoscopy or colonoscopy. You may have a sigmoidoscopy every 5 years or a colonoscopy every 10 years starting at age 73.  Hepatitis C blood test.  Hepatitis B blood test.  Sexually transmitted disease (STD) testing.  Diabetes screening. This is done by checking your blood sugar (glucose) after you have not eaten for a while (fasting). You may have this done every 1-3 years.  Bone density scan. This is done to screen for osteoporosis. You may have this done starting at age 28.  Mammogram. This may be done every 1-2 years. Talk to your health care provider about how often you should have regular mammograms. Talk with your health care provider about your test results, treatment options, and if necessary,  the need for more tests. Vaccines  Your health care provider may recommend certain vaccines, such as:  Influenza vaccine. This is recommended every year.  Tetanus, diphtheria, and acellular pertussis (Tdap, Td)  vaccine. You may need a Td booster every 10 years.  Zoster vaccine. You may need this after age 66.  Pneumococcal 13-valent conjugate (PCV13) vaccine. One dose is recommended after age 29.  Pneumococcal polysaccharide (PPSV23) vaccine. One dose is recommended after age 37. Talk to your health care provider about which screenings and vaccines you need and how often you need them. This information is not intended to replace advice given to you by your health care provider. Make sure you discuss any questions you have with your health care provider. Document Released: 04/10/2015 Document Revised: 12/02/2015 Document Reviewed: 01/13/2015 Elsevier Interactive Patient Education  2017 [place] Prevention in the Home Falls can cause injuries. They can happen to people of all ages. There are many things you can do to make your home safe and to help prevent falls. What can I do on the outside of my home?  Regularly fix the edges of walkways and driveways and fix any cracks.  Remove anything that might make you trip as you walk through a door, such as a raised step or threshold.  Trim any bushes or trees on the path to your home.  Use bright outdoor lighting.  Clear any walking paths of anything that might make someone trip, such as rocks or tools.  Regularly check to see if handrails are loose or broken. Make sure that both sides of any steps have handrails.  Any raised decks and porches should have guardrails on the edges.  Have any leaves, snow, or ice cleared regularly.  Use sand or salt on walking paths during winter.  Clean up any spills in your garage right away. This includes oil or grease spills. What can I do in the bathroom?  Use night lights.  Install grab bars by the toilet and in the tub and shower. Do not use towel bars as grab bars.  Use non-skid mats or decals in the tub or shower.  If you need to sit down in the shower, use a plastic, non-slip  stool.  Keep the floor dry. Clean up any water that spills on the floor as soon as it happens.  Remove soap buildup in the tub or shower regularly.  Attach bath mats securely with double-sided non-slip rug tape.  Do not have throw rugs and other things on the floor that can make you trip. What can I do in the bedroom?  Use night lights.  Make sure that you have a light by your bed that is easy to reach.  Do not use any sheets or blankets that are too big for your bed. They should not hang down onto the floor.  Have a firm chair that has side arms. You can use this for support while you get dressed.  Do not have throw rugs and other things on the floor that can make you trip. What can I do in the kitchen?  Clean up any spills right away.  Avoid walking on wet floors.  Keep items that you use a lot in easy-to-reach places.  If you need to reach something above you, use a strong step stool that has a grab bar.  Keep electrical cords out of the way.  Do not use floor polish or wax that makes floors slippery. If you must use  wax, use non-skid floor wax.  Do not have throw rugs and other things on the floor that can make you trip. What can I do with my stairs?  Do not leave any items on the stairs.  Make sure that there are handrails on both sides of the stairs and use them. Fix handrails that are broken or loose. Make sure that handrails are as long as the stairways.  Check any carpeting to make sure that it is firmly attached to the stairs. Fix any carpet that is loose or worn.  Avoid having throw rugs at the top or bottom of the stairs. If you do have throw rugs, attach them to the floor with carpet tape.  Make sure that you have a light switch at the top of the stairs and the bottom of the stairs. If you do not have them, ask someone to add them for you. What else can I do to help prevent falls?  Wear shoes that:  Do not have high heels.  Have rubber bottoms.  Are  comfortable and fit you well.  Are closed at the toe. Do not wear sandals.  If you use a stepladder:  Make sure that it is fully opened. Do not climb a closed stepladder.  Make sure that both sides of the stepladder are locked into place.  Ask someone to hold it for you, if possible.  Clearly mark and make sure that you can see:  Any grab bars or handrails.  First and last steps.  Where the edge of each step is.  Use tools that help you move around (mobility aids) if they are needed. These include:  Canes.  Walkers.  Scooters.  Crutches.  Turn on the lights when you go into a dark area. Replace any light bulbs as soon as they burn out.  Set up your furniture so you have a clear path. Avoid moving your furniture around.  If any of your floors are uneven, fix them.  If there are any pets around you, be aware of where they are.  Review your medicines with your doctor. Some medicines can make you feel dizzy. This can increase your chance of falling. Ask your doctor what other things that you can do to help prevent falls. This information is not intended to replace advice given to you by your health care provider. Make sure you discuss any questions you have with your health care provider. Document Released: 01/08/2009 Document Revised: 08/20/2015 Document Reviewed: 04/18/2014 Elsevier Interactive Patient Education  2017 [employer].

## 2020-01-01 NOTE — Patient Instructions (Signed)
Health Maintenance After Age 78 After age 78, you are at a higher risk for certain long-term diseases and infections as well as injuries from falls. Falls are a major cause of broken bones and head injuries in people who are older than age 78. Getting regular preventive care can help to keep you healthy and well. Preventive care includes getting regular testing and making lifestyle changes as recommended by your health care provider. Talk with your health care provider about:  Which screenings and tests you should have. A screening is a test that checks for a disease when you have no symptoms.  A diet and exercise plan that is right for you. What should I know about screenings and tests to prevent falls? Screening and testing are the best ways to find a health problem early. Early diagnosis and treatment give you the best chance of managing medical conditions that are common after age 78. Certain conditions and lifestyle choices may make you more likely to have a fall. Your health care provider may recommend:  Regular vision checks. Poor vision and conditions such as cataracts can make you more likely to have a fall. If you wear glasses, make sure to get your prescription updated if your vision changes.  Medicine review. Work with your health care provider to regularly review all of the medicines you are taking, including over-the-counter medicines. Ask your health care provider about any side effects that may make you more likely to have a fall. Tell your health care provider if any medicines that you take make you feel dizzy or sleepy.  Osteoporosis screening. Osteoporosis is a condition that causes the bones to get weaker. This can make the bones weak and cause them to break more easily.  Blood pressure screening. Blood pressure changes and medicines to control blood pressure can make you feel dizzy.  Strength and balance checks. Your health care provider may recommend certain tests to check your  strength and balance while standing, walking, or changing positions.  Foot health exam. Foot pain and numbness, as well as not wearing proper footwear, can make you more likely to have a fall.  Depression screening. You may be more likely to have a fall if you have a fear of falling, feel emotionally low, or feel unable to do activities that you used to do.  Alcohol use screening. Using too much alcohol can affect your balance and may make you more likely to have a fall. What actions can I take to lower my risk of falls? General instructions  Talk with your health care provider about your risks for falling. Tell your health care provider if: ? You fall. Be sure to tell your health care provider about all falls, even ones that seem minor. ? You feel dizzy, sleepy, or off-balance.  Take over-the-counter and prescription medicines only as told by your health care provider. These include any supplements.  Eat a healthy diet and maintain a healthy weight. A healthy diet includes low-fat dairy products, low-fat (lean) meats, and fiber from whole grains, beans, and lots of fruits and vegetables. Home safety  Remove any tripping hazards, such as rugs, cords, and clutter.  Install safety equipment such as grab bars in bathrooms and safety rails on stairs.  Keep rooms and walkways well-lit. Activity   Follow a regular exercise program to stay fit. This will help you maintain your balance. Ask your health care provider what types of exercise are appropriate for you.  If you need a cane or   walker, use it as recommended by your health care provider.  Wear supportive shoes that have nonskid soles. Lifestyle  Do not drink alcohol if your health care provider tells you not to drink.  If you drink alcohol, limit how much you have: ? 0-1 drink a day for women. ? 0-2 drinks a day for men.  Be aware of how much alcohol is in your drink. In the [place] one drink equals one typical bottle of beer (12  oz), one-half glass of wine (5 oz), or one shot of hard liquor (1 oz).  Do not use any products that contain nicotine or tobacco, such as cigarettes and e-cigarettes. If you need help quitting, ask your health care provider. Summary  Having a healthy lifestyle and getting preventive care can help to protect your health and wellness after age 78.  Screening and testing are the best way to find a health problem early and help you avoid having a fall. Early diagnosis and treatment give you the best chance for managing medical conditions that are more common for people who are older than age 78.  Falls are a major cause of broken bones and head injuries in people who are older than age 78. Take precautions to prevent a fall at home.  Work with your health care provider to learn what changes you can make to improve your health and wellness and to prevent falls. This information is not intended to replace advice given to you by your health care provider. Make sure you discuss any questions you have with your health care provider. Document Revised: 07/05/2018 Document Reviewed: 01/25/2017 Elsevier Patient Education  2020 [employer]  

## 2020-01-01 NOTE — Progress Notes (Signed)
I,Katawbba Wiggins,acting as a Education administrator for [name], MD.,have documented all relevant documentation on the behalf of [name], MD,as directed by  [name], MD while in the presence of [name], MD.  This visit occurred during the SARS-CoV-2 public health emergency.  Safety protocols were in place, including screening questions prior to the visit, additional usage of staff PPE, and extensive cleaning of exam room while observing appropriate contact time as indicated for disinfecting solutions.  Subjective:     [name] , female    DOB: 1940/11/18 , 79 y.o.   MRN: [id number]   Chief Complaint  Patient presents with  . Annual Exam  . Hypertension    HPI  She is here today for a full physical examination.  She is no longer followed by GYN. She reports compliance with meds. Denies headaches, chest pain and shortness of breath.   Hypertension This is a chronic problem. The current episode started more than 1 year ago. The problem has been gradually improving since onset. The problem is controlled. Pertinent negatives include no blurred vision, chest pain, palpitations or shortness of breath. Risk factors for coronary artery disease include obesity, post-menopausal state and sedentary lifestyle. Past treatments include angiotensin blockers and diuretics. The current treatment provides moderate improvement. Compliance problems include exercise.      Past Medical History:  Diagnosis Date  . Arthritis    RA  . CAD (coronary artery disease)   . Fibroid, uterine   . Hypertension      Family History  Problem Relation Age of Onset  . Heart disease Mother   . Dementia Mother   . Congestive Heart Failure Mother   . Parkinson's disease Father   . Pancreatic cancer Sister      Current Outpatient Medications:  .  aspirin EC 81 MG tablet, Take 81 mg by mouth daily., Disp: , Rfl:  .  Cholecalciferol (VITAMIN D) 50 MCG (2000 UT) tablet, Take 2,000  Units by mouth daily. , Disp: , Rfl:  .  furosemide (LASIX) 20 MG tablet, Take 1 tablet (20 mg total) by mouth daily as needed for fluid or edema., Disp: 90 tablet, Rfl: 1 .  labetalol (NORMODYNE) 100 MG tablet, Take 1 tablet (100 mg total) by mouth 2 (two) times daily., Disp: 180 tablet, Rfl: 3 .  mometasone (NASONEX) 50 MCG/ACT nasal spray, PLACE 2 SPRAYS INTO THE NOSE DAILY AS NEEDED (ALLERGIES)., Disp: 17 each, Rfl: 1 .  olmesartan-hydrochlorothiazide (BENICAR HCT) 40-25 MG tablet, TAKE 1 TABLET BY MOUTH EVERY MORNING, Disp: 30 tablet, Rfl: 3 .  hydrocortisone (ANUSOL-HC) 25 MG suppository, Place 1 suppository (25 mg total) rectally 2 (two) times daily., Disp: 12 suppository, Rfl: 0   No Known Allergies    The patient states she uses post menopausal status for birth control. Last LMP was No LMP recorded. Patient is postmenopausal.. Negative for Dysmenorrhea. Negative for: breast discharge, breast lump(s), breast pain and breast self exam. Associated symptoms include abnormal vaginal bleeding. Pertinent negatives include abnormal bleeding (hematology), anxiety, decreased libido, depression, difficulty falling sleep, dyspareunia, history of infertility, nocturia, sexual dysfunction, sleep disturbances, urinary incontinence, urinary urgency, vaginal discharge and vaginal itching. Diet regular.The patient states her exercise level is  intermittent.   . The patient's tobacco use is:  Social History   Tobacco Use  Smoking Status Never Smoker  Smokeless Tobacco Never Used  . She has been exposed to passive smoke. The patient's alcohol use is:  Social  History   Substance and Sexual Activity  Alcohol Use No  . Alcohol/week: 0.0 standard drinks    Review of Systems  Constitutional: Negative.   HENT: Negative.   Eyes: Negative.  Negative for blurred vision.  Respiratory: Negative.  Negative for shortness of breath.   Cardiovascular: Negative.  Negative for chest pain and palpitations.   Gastrointestinal: Positive for constipation.  Endocrine: Negative.   Genitourinary: Negative.   Musculoskeletal: Negative.   Skin: Negative.   Allergic/Immunologic: Negative.   Neurological: Positive for numbness (Both hands had carpal tunnel surgery last Sept).  Hematological: Negative.   Psychiatric/Behavioral: Negative.      Today's Vitals   01/01/20 1419  BP: 132/86  Pulse: 76  Temp: 98.1 F (36.7 C)  TempSrc: Oral  Weight: 166 lb 9.6 oz (75.6 kg)  Height: 5' 1.8" (1.57 m)  PainSc: 0-No pain   Body mass index is 30.67 kg/m.  Wt Readings from Last 3 Encounters:  01/01/20 166 lb 9.6 oz (75.6 kg)  01/01/20 166 lb 9.6 oz (75.6 kg)  12/17/19 171 lb 6.4 oz (77.7 kg)   Objective:  Physical Exam Constitutional:      General: She is not in acute distress.    Appearance: Normal appearance. She is well-developed.  HENT:     Head: Normocephalic and atraumatic.     Right Ear: Hearing, ear canal and external ear normal. There is impacted cerumen.     Left Ear: Hearing, ear canal and external ear normal. There is impacted cerumen.     Nose:     Comments: Deferred, masked    Mouth/Throat:     Comments: Deferred, masked Eyes:     General: Lids are normal.     Extraocular Movements: Extraocular movements intact.     Conjunctiva/sclera: Conjunctivae normal.     Pupils: Pupils are equal, round, and reactive to light.     Funduscopic exam:    Right eye: No papilledema.        Left eye: No papilledema.  Neck:     Thyroid: No thyroid mass.     Vascular: No carotid bruit.  Cardiovascular:     Rate and Rhythm: Normal rate and regular rhythm.     Pulses: Normal pulses.     Heart sounds: Normal heart sounds. No murmur heard.   Pulmonary:     Effort: Pulmonary effort is normal.     Breath sounds: Normal breath sounds.  Chest:     Breasts: Tanner Score is 5.        Right: Normal.        Left: Normal.  Abdominal:     General: Bowel sounds are normal. There is no distension.      Palpations: Abdomen is soft.     Tenderness: There is no abdominal tenderness.  Musculoskeletal:        General: No swelling. Normal range of motion.     Cervical back: Full passive range of motion without pain, normal range of motion and neck supple.     Right lower leg: Edema present.     Left lower leg: Edema present.  Skin:    General: Skin is warm and dry.     Capillary Refill: Capillary refill takes less than 2 seconds.  Neurological:     General: No focal deficit present.     Mental Status: She is alert and oriented to person, place, and time.     Cranial Nerves: No cranial nerve deficit.     Sensory:  No sensory deficit.  Psychiatric:        Mood and Affect: Mood normal.        Behavior: Behavior normal.        Thought Content: Thought content normal.        Judgment: Judgment normal.         Assessment And Plan:     1. Routine general medical examination at a health care facility Comments: A full exam was performed. Importance of monthly self breast exams was discussed with the patient. PATIENT IS ADVISED TO GET 30-45 MINUTES REGULAR EXERCISE NO LESS THAN FOUR TO FIVE DAYS PER WEEK - BOTH WEIGHTBEARING EXERCISES AND AEROBIC ARE RECOMMENDED.  PATIENT IS ADVISED TO FOLLOW A HEALTHY DIET WITH AT LEAST SIX FRUITS/VEGGIES PER DAY, DECREASE INTAKE OF RED MEAT, AND TO INCREASE FISH INTAKE TO TWO DAYS PER WEEK.  MEATS/FISH SHOULD NOT BE FRIED, BAKED OR BROILED IS PREFERABLE.  I SUGGEST WEARING SPF 50 SUNSCREEN ON EXPOSED PARTS AND ESPECIALLY WHEN IN THE DIRECT SUNLIGHT FOR AN EXTENDED PERIOD OF TIME.  PLEASE AVOID FAST FOOD RESTAURANTS AND INCREASE YOUR WATER INTAKE.  2. Malignant hypertension Comments: Chronic, much improved control. Encouraged to avoid adding salt to her foods and to incorporate more activity into her daily routine. EKG performed, NSR w/o acute changes. She will continue with current meds. Advised to increase her daily activity as tolerated. She will f/u in six  monthsfor re-evaluation. - POCT Urinalysis Dipstick (81002) - POCT UA - Microalbumin - EKG 12-Lead - CBC - CMP14+EGFR - Lipid panel  3. Bilateral impacted cerumen Comments: Declined irrigation. Wants to schedule for another day.   4. Chronic constipation Comments: She was given samples of Linzess. She will let me know if helpful. Advised to take upon awakening and to wait 30 minutes prior to eating breakfast.   5. External hemorrhoids Comments: As I was leaving room, she mentioned having hemorrhoids. Declined exam. She was given rx Anusol suppositories to use prn.  - hydrocortisone (ANUSOL-HC) 25 MG suppository; Place 1 suppository (25 mg total) rectally 2 (two) times daily.  Dispense: 12 suppository; Refill: 0  6. Other abnormal glucose Comments: Her a1c has been elevated in the past, I will recheck this today. She is encouraged to limit her intake of sugary beverages and processed foods.   7. Vitamin D deficiency disease Comments: I will check vitamin D level and supplement as needed.  - VITAMIN D 25 Hydroxy (Vit-D Deficiency, Fractures)  8. Class 1 obesity due to excess calories with serious comorbidity and body mass index (BMI) of 30.0 to 30.9 in adult Comments: Her BMI is acceptable for her demographic. Encouraged to aim for at least 150 minutes of exercise per week.   9. Encounter for HCV screening test for low risk patient Comments: I will check HCV antibody.  - Hepatitis C antibody  10. Need for vaccination Comments: She was given high dose flu vaccine.  - Flu Vaccine QUAD High Dose(Fluad)    Patient was given opportunity to ask questions. Patient verbalized understanding of the plan and was able to repeat key elements of the plan. All questions were answered to their satisfaction.   [name], MD   I, [name], MD, have reviewed all documentation for this visit. The documentation on 01/18/20 for the exam, diagnosis, procedures, and orders are all  accurate and complete.  THE PATIENT IS ENCOURAGED TO PRACTICE SOCIAL DISTANCING DUE TO THE COVID-19 PANDEMIC.

## 2020-01-01 NOTE — Progress Notes (Signed)
This visit occurred during the SARS-CoV-2 public health emergency.  Safety protocols were in place, including screening questions prior to the visit, additional usage of staff PPE, and extensive cleaning of exam room while observing appropriate contact time as indicated for disinfecting solutions.  Subjective:   Tina Ware is a 79 y.o. female who presents for Medicare Annual (Subsequent) preventive examination.  Review of Systems     Cardiac Risk Factors include: advanced age (>3men, >44 women);hypertension;obesity (BMI >30kg/m2);sedentary lifestyle     Objective:    Today's Vitals   01/01/20 1514  BP: 132/86  Pulse: 76  Temp: 98.1 F (36.7 C)  TempSrc: Oral  Weight: 166 lb 9.6 oz (75.6 kg)  Height: 5' 1.8" (1.57 m)   Body mass index is 30.67 kg/m.  Advanced Directives 01/01/2020 06/04/2019 01/10/2019 12/25/2018 03/14/2018 04/29/2017 04/29/2017  Does Patient Have a Medical Advance Directive? No No No No No No No  Would patient like information on creating a medical advance directive? Yes (MAU/Ambulatory/Procedural Areas - Information given) No - Patient declined No - Patient declined - Yes (MAU/Ambulatory/Procedural Areas - Information given) No - Patient declined No - Patient declined    Current Medications (verified) Outpatient Encounter Medications as of 01/01/2020  Medication Sig  . aspirin EC 81 MG tablet Take 81 mg by mouth daily.  . Cholecalciferol (VITAMIN D) 50 MCG (2000 UT) tablet Take 2,000 Units by mouth daily.   . furosemide (LASIX) 20 MG tablet Take 1 tablet (20 mg total) by mouth daily as needed for fluid or edema.  Marland Kitchen labetalol (NORMODYNE) 100 MG tablet Take 1 tablet (100 mg total) by mouth 2 (two) times daily.  . mometasone (NASONEX) 50 MCG/ACT nasal spray PLACE 2 SPRAYS INTO THE NOSE DAILY AS NEEDED (ALLERGIES).  Marland Kitchen olmesartan-hydrochlorothiazide (BENICAR HCT) 40-25 MG tablet TAKE 1 TABLET BY MOUTH EVERY MORNING   No facility-administered encounter  medications on file as of 01/01/2020.    Allergies (verified) Patient has no known allergies.   History: Past Medical History:  Diagnosis Date  . Arthritis    RA  . CAD (coronary artery disease)   . Fibroid, uterine   . Hypertension    Past Surgical History:  Procedure Laterality Date  . CARPAL TUNNEL RELEASE Right 12/20/2018  . CATARACT EXTRACTION, BILATERAL    . CHOLECYSTECTOMY    . TRIGGER FINGER RELEASE Right 12/20/2018   Family History  Problem Relation Age of Onset  . Heart disease Mother   . Dementia Mother   . Congestive Heart Failure Mother   . Parkinson's disease Father   . Pancreatic cancer Sister    Social History   Socioeconomic History  . Marital status: Single    Spouse name: Not on file  . Number of children: 1  . Years of education: Not on file  . Highest education level: Not on file  Occupational History  . Not on file  Tobacco Use  . Smoking status: Never Smoker  . Smokeless tobacco: Never Used  Vaping Use  . Vaping Use: Never used  Substance and Sexual Activity  . Alcohol use: No    Alcohol/week: 0.0 standard drinks  . Drug use: No  . Sexual activity: Not Currently  Other Topics Concern  . Not on file  Social History Narrative  . Not on file   Social Determinants of Health   Financial Resource Strain: Low Risk   . Difficulty of Paying Living Expenses: Not hard at all  Food Insecurity: No Food Insecurity  .  Worried About Charity fundraiser in the Last Year: Never true  . Ran Out of Food in the Last Year: Never true  Transportation Needs: No Transportation Needs  . Lack of Transportation (Medical): No  . Lack of Transportation (Non-Medical): No  Physical Activity: Inactive  . Days of Exercise per Week: 0 days  . Minutes of Exercise per Session: 0 min  Stress: No Stress Concern Present  . Feeling of Stress : Not at all  Social Connections:   . Frequency of Communication with Friends and Family: Not on file  . Frequency of Social  Gatherings with Friends and Family: Not on file  . Attends Religious Services: Not on file  . Active Member of Clubs or Organizations: Not on file  . Attends Archivist Meetings: Not on file  . Marital Status: Not on file    Tobacco Counseling Counseling given: Not Answered   Clinical Intake:  Pre-visit preparation completed: Yes  Pain : No/denies pain     Nutritional Status: BMI > 30  Obese Nutritional Risks: None Diabetes: No  How often do you need to have someone help you when you read instructions, pamphlets, or other written materials from your doctor or pharmacy?: 1 - Never What is the last grade level you completed in school?: college  Diabetic? no  Interpreter Needed?: No  Information entered by :: NAllen LPN   Activities of Daily Living In your present state of health, do you have any difficulty performing the following activities: 01/01/2020 06/04/2019  Hearing? N N  Vision? N N  Difficulty concentrating or making decisions? N N  Walking or climbing stairs? N N  Dressing or bathing? N N  Doing errands, shopping? N N  Preparing Food and eating ? N -  Using the Toilet? N -  In the past six months, have you accidently leaked urine? N -  Do you have problems with loss of bowel control? N -  Managing your Medications? N -  Managing your Finances? N -  Housekeeping or managing your Housekeeping? N -  Some recent data might be hidden    Patient Care Team: Glendale Chard, MD as PCP - General (Internal Medicine)  Indicate any recent Medical Services you may have received from other than [facility] providers in the past year (date may be approximate).     Assessment:   This is a routine wellness examination for [name].  Hearing/Vision screen  Hearing Screening   125Hz  250Hz  500Hz  1000Hz  2000Hz  3000Hz  4000Hz  6000Hz  8000Hz   Right ear:           Left ear:           Vision Screening Comments: Regular eye exams, Dr. Herschel [place]  Dietary issues and exercise  activities discussed: Current Exercise Habits: The patient does not participate in regular exercise at present  Goals    .  DIET - INCREASE WATER INTAKE (pt-stated)    .  Exercise 150 min/wk Moderate Activity (pt-stated)    .  Patient Stated      12/25/2018, wants to continue to remain mobile and stay cognitive    .  Patient Stated      01/01/2020, losing weight, maintain BP      Depression Screen PHQ 2/9 Scores 01/01/2020 07/01/2019 06/12/2019 02/05/2019 01/23/2019 12/25/2018 12/13/2018  PHQ - 2 Score 0 0 0 0 0 0 0  PHQ- 9 Score - - - - - 0 -    Fall Risk Fall Risk  01/01/2020 07/01/2019  06/12/2019 02/05/2019 01/23/2019  Falls in the past year? 1 0 1 0 0  Comment fainted - - - -  Number falls in past yr: 0 - 0 - -  Injury with Fall? 1 - 1 - -  Comment cut forehead - - - -  Risk for fall due to : Medication side effect - - - -  Follow up Falls evaluation completed;Education provided;Falls prevention discussed - - - -    Any stairs in or around the home? Yes  If so, are there any without handrails? No  Home free of loose throw rugs in walkways, pet beds, electrical cords, etc? Yes  Adequate lighting in your home to reduce risk of falls? Yes   ASSISTIVE DEVICES UTILIZED TO PREVENT FALLS:  Life alert? No  Use of a cane, walker or w/c? No  Grab bars in the bathroom? Yes  Shower chair or bench in shower? No  Elevated toilet seat or a handicapped toilet? No   TIMED UP AND GO:  Was the test performed? No . .   Gait steady and fast without use of assistive device  Cognitive Function:     6CIT Screen 01/01/2020 12/25/2018 03/14/2018  What Year? 0 points 0 points 0 points  What month? 0 points 0 points 0 points  What time? 0 points 0 points 0 points  Count back from 20 0 points 0 points 0 points  Months in reverse 0 points 0 points 0 points  Repeat phrase 4 points 0 points 0 points  Total Score 4 0 0    Immunizations Immunization History  Administered Date(s) Administered  .  Influenza, High Dose Seasonal PF 01/15/2018, 01/09/2019  . Moderna SARS-COVID-2 Vaccination 04/21/2019, 05/19/2019  . Pneumococcal Conjugate-13 12/13/2018  . Tdap 07/06/2017  . Zoster Recombinat (Shingrix) 11/09/2018, 02/28/2019, 03/26/2019    TDAP status: Up to date Flu Vaccine status: Up to date Pneumococcal vaccine status: Up to date Covid-19 vaccine status: Completed vaccines  Qualifies for Shingles Vaccine? Yes   Zostavax completed No   Shingrix Completed?: Yes  Screening Tests Health Maintenance  Topic Date Due  . INFLUENZA VACCINE  10/27/2019  . MAMMOGRAM  03/27/2020  . TETANUS/TDAP  07/07/2027  . DEXA SCAN  Completed  . COVID-19 Vaccine  Completed  . Hepatitis C Screening  Completed  . PNA vac Low Risk Adult  Completed    Health Maintenance  Health Maintenance Due  Topic Date Due  . INFLUENZA VACCINE  10/27/2019    Colorectal cancer screening: Completed 03/20/2019. Repeat every 10 years Mammogram status: Completed 03/20/2019. Repeat every year Bone Density status: Completed 03/26/2018.    Lung Cancer Screening: (Low Dose CT Chest recommended if Age 19-80 years, 30 pack-year currently smoking OR have quit w/in 15years.) does not qualify.   Lung Cancer Screening Referral: no  Additional Screening:  Hepatitis C Screening: does qualify; Completed today  Vision Screening: Recommended annual ophthalmology exams for early detection of glaucoma and other disorders of the eye. Is the patient up to date with their annual eye exam?  Yes  Who is the provider or what is the name of the office in which the patient attends annual eye exams? Dr. Herschel Senegal If pt is not established with a provider, would they like to be referred to a provider to establish care? No .   Dental Screening: Recommended annual dental exams for proper oral hygiene  Community Resource Referral / Chronic Care Management: CRR required this visit?  No   CCM required  this visit?  No      Plan:       I have personally reviewed and noted the following in the patient's chart:   . Medical and social history . Use of alcohol, tobacco or illicit drugs  . Current medications and supplements . Functional ability and status . Nutritional status . Physical activity . Advanced directives . List of other physicians . Hospitalizations, surgeries, and ER visits in previous 12 months . Vitals . Screenings to include cognitive, depression, and falls . Referrals and appointments  In addition, I have reviewed and discussed with patient certain preventive protocols, quality metrics, and best practice recommendations. A written personalized care plan for preventive services as well as general preventive health recommendations were provided to patient.     Kellie Simmering, LPN   [id number]   Nurse Notes:

## 2020-01-02 LAB — CBC
Hematocrit: 39.8 % (ref 34.0–46.6)
Hemoglobin: 13.6 g/dL (ref 11.1–15.9)
MCH: 29.8 pg (ref 26.6–33.0)
MCHC: 34.2 g/dL (ref 31.5–35.7)
MCV: 87 fL (ref 79–97)
Platelets: 202 10*3/uL (ref 150–450)
RBC: 4.56 x10E6/uL (ref 3.77–5.28)
RDW: 13.1 % (ref 11.7–15.4)
WBC: 3.7 10*3/uL (ref 3.4–10.8)

## 2020-01-02 LAB — CMP14+EGFR
ALT: 15 IU/L (ref 0–32)
AST: 22 IU/L (ref 0–40)
Albumin/Globulin Ratio: 1.9 (ref 1.2–2.2)
Albumin: 3.6 g/dL — ABNORMAL LOW (ref 3.7–4.7)
Alkaline Phosphatase: 63 IU/L (ref 44–121)
BUN/Creatinine Ratio: 16 (ref 12–28)
BUN: 14 mg/dL (ref 8–27)
Bilirubin Total: 0.4 mg/dL (ref 0.0–1.2)
CO2: 27 mmol/L (ref 20–29)
Calcium: 9.3 mg/dL (ref 8.7–10.3)
Chloride: 99 mmol/L (ref 96–106)
Creatinine, Ser: 0.89 mg/dL (ref 0.57–1.00)
GFR calc Af Amer: 72 mL/min/{1.73_m2} (ref >59)
GFR calc non Af Amer: 62 mL/min/{1.73_m2} (ref >59)
Globulin, Total: 1.9 g/dL (ref 1.5–4.5)
Glucose: 94 mg/dL (ref 65–99)
Potassium: 4.2 mmol/L (ref 3.5–5.2)
Sodium: 136 mmol/L (ref 134–144)
Total Protein: 5.5 g/dL — ABNORMAL LOW (ref 6.0–8.5)

## 2020-01-02 LAB — LIPID PANEL
Chol/HDL Ratio: 2.5 ratio (ref 0.0–4.4)
Cholesterol, Total: 196 mg/dL (ref 100–199)
HDL: 80 mg/dL (ref >39)
LDL Chol Calc (NIH): 108 mg/dL — ABNORMAL HIGH (ref 0–99)
Triglycerides: 44 mg/dL (ref 0–149)
VLDL Cholesterol Cal: 8 mg/dL (ref 5–40)

## 2020-01-02 LAB — VITAMIN D 25 HYDROXY (VIT D DEFICIENCY, FRACTURES): Vit D, 25-Hydroxy: 62.3 ng/mL (ref 30.0–100.0)

## 2020-01-02 LAB — HEPATITIS C ANTIBODY: Hep C Virus Ab: <0.1 s/co ratio (ref 0.0–0.9)

## 2020-01-08 DIAGNOSIS — K645 Perianal venous thrombosis: Secondary | ICD-10-CM | POA: Diagnosis not present

## 2020-01-20 ENCOUNTER — Other Ambulatory Visit: Payer: Self-pay | Admitting: Internal Medicine

## 2020-01-24 ENCOUNTER — Ambulatory Visit (INDEPENDENT_AMBULATORY_CARE_PROVIDER_SITE_OTHER): Payer: Medicare PPO

## 2020-01-24 DIAGNOSIS — Z23 Encounter for immunization: Secondary | ICD-10-CM | POA: Diagnosis not present

## 2020-01-24 NOTE — Progress Notes (Signed)
   Covid-19 Vaccination Clinic  Name:  Tina Ware    MRN: [id number] DOB: 1940-12-31  01/24/2020  [name] was observed post Covid-19 immunization for 15 minutes without incident. She was provided with Vaccine Information Sheet and instruction to access the V-Safe system.   [name] was instructed to call 911 with any severe reactions post vaccine: Marland Kitchen Difficulty breathing  . Swelling of face and throat  . A fast heartbeat  . A bad rash all over body  . Dizziness and weakness

## 2020-01-26 DIAGNOSIS — I10 Essential (primary) hypertension: Secondary | ICD-10-CM | POA: Diagnosis not present

## 2020-02-04 ENCOUNTER — Other Ambulatory Visit: Payer: Self-pay | Admitting: Internal Medicine

## 2020-02-25 DIAGNOSIS — I10 Essential (primary) hypertension: Secondary | ICD-10-CM | POA: Diagnosis not present

## 2020-03-12 ENCOUNTER — Other Ambulatory Visit: Payer: Self-pay

## 2020-03-12 ENCOUNTER — Encounter: Payer: Self-pay | Admitting: Cardiology

## 2020-03-12 ENCOUNTER — Ambulatory Visit: Payer: Medicare PPO | Admitting: Cardiology

## 2020-03-12 VITALS — BP 128/75 | HR 77 | Resp 16 | Ht 61.0 in | Wt 172.0 lb

## 2020-03-12 DIAGNOSIS — R0600 Dyspnea, unspecified: Secondary | ICD-10-CM

## 2020-03-12 DIAGNOSIS — I10 Essential (primary) hypertension: Secondary | ICD-10-CM

## 2020-03-12 DIAGNOSIS — R600 Localized edema: Secondary | ICD-10-CM

## 2020-03-12 DIAGNOSIS — R0609 Other forms of dyspnea: Secondary | ICD-10-CM | POA: Diagnosis not present

## 2020-03-12 NOTE — Progress Notes (Signed)
Primary Physician/Referring:  Glendale Chard, MD  [name], female    DOB: [birth year], 79 y.o.   MRN: [id number]  Chief Complaint  Patient presents with  . Hypertension  . Edema  . Follow-up   HPI:    Tina Ware  is a 79 y.o.  female with PMH significant for hypertension, prediabetes, hypercholesterolemia, rheumatoid arthritis, difficult to control hypertension and leg edema presents here for follow-up.    Overall she is feeling well and has noticed blood pressure is controlled.   Edema is stable, dyspnea on exertion is also stable.  No PND or orthopnea.  Past Medical History:  Diagnosis Date  . Arthritis    RA  . CAD (coronary artery disease)   . Fibroid, uterine   . Hypertension    Past Surgical History:  Procedure Laterality Date  . CARPAL TUNNEL RELEASE Right 12/20/2018  . CATARACT EXTRACTION, BILATERAL    . CHOLECYSTECTOMY    . TRIGGER FINGER RELEASE Right 12/20/2018   Family History  Problem Relation Age of Onset  . Heart disease Mother   . Dementia Mother   . Congestive Heart Failure Mother   . Parkinson's disease Father   . Pancreatic cancer Sister     Social History   Tobacco Use  . Smoking status: Never Smoker  . Smokeless tobacco: Never Used  Substance Use Topics  . Alcohol use: No    Alcohol/week: 0.0 standard drinks   Marital Status: Single ROS  Review of Systems  Cardiovascular: Positive for dyspnea on exertion and leg swelling (right worse than left). Negative for chest pain.  Gastrointestinal: Negative for melena.   Objective  Blood pressure 128/75, pulse 77, resp. rate 16, height 5\' 1"  (1.549 m), weight 172 lb (78 kg), SpO2 99 %.  Vitals with BMI 03/12/2020 03/12/2020 01/01/2020  Height - 5\' 1"  5' 1.8"  Weight - 172 lbs 166 lbs 10 oz  BMI - 56.81 27.51  Systolic 700 174 944  Diastolic 75 75 86  Pulse 77 82 76     Physical Exam Constitutional:      Comments: Well built and mildly obese and appears younger  than stated age  Cardiovascular:     Rate and Rhythm: Normal rate and regular rhythm.     Pulses: Intact distal pulses.          Dorsalis pedis pulses are 2+ on the right side and 2+ on the left side.       Posterior tibial pulses are 1+ on the right side and 1+ on the left side.     Heart sounds: Normal heart sounds. No murmur heard. No gallop.      Comments: 2+ below right knee leg edema, 1 + left and pitting.  large adipose tissue present. No JVD. Pulmonary:     Effort: Pulmonary effort is normal.     Breath sounds: Normal breath sounds.  Abdominal:     General: Bowel sounds are normal.     Palpations: Abdomen is soft.     Comments: obese    Laboratory examination:   Recent Labs    08/16/19 1430 08/21/19 1420 01/01/20 1530  NA 130* 136 136  K 3.9 4.0 4.2  CL 95* 97 99  CO2 26 25 27   GLUCOSE 82 80 94  BUN 9 8 14   CREATININE 0.71 0.77 0.89  CALCIUM 9.0 9.4 9.3  GFRNONAA 82 74 62  GFRAA 94 86 72   CrCl cannot be calculated (Patient's  most recent lab result is older than the maximum 21 days allowed.).  CMP Latest Ref Rng & Units 01/01/2020 08/21/2019 08/16/2019  Glucose 65 - 99 mg/dL 94 80 82  BUN 8 - 27 mg/dL 14 8 9   Creatinine 0.57 - 1.00 mg/dL 0.89 0.77 0.71  Sodium 134 - 144 mmol/L 136 136 130(L)  Potassium 3.5 - 5.2 mmol/L 4.2 4.0 3.9  Chloride 96 - 106 mmol/L 99 97 95(L)  CO2 20 - 29 mmol/L 27 25 26   Calcium 8.7 - 10.3 mg/dL 9.3 9.4 9.0  Total Protein 6.0 - 8.5 g/dL 5.5(L) - -  Total Bilirubin 0.0 - 1.2 mg/dL 0.4 - -  Alkaline Phos 44 - 121 IU/L 63 - -  AST 0 - 40 IU/L 22 - -  ALT 0 - 32 IU/L 15 - -   CBC Latest Ref Rng & Units 01/01/2020 06/07/2019 06/06/2019  WBC 3.4 - 10.8 x10E3/uL 3.7 4.0 3.6(L)  Hemoglobin 11.1 - 15.9 g/dL 13.6 13.1 14.5  Hematocrit 34.0 - 46.6 % 39.8 37.2 40.6  Platelets 150 - 450 x10E3/uL 202 177 191   Lipid Panel     Component Value Date/Time   CHOL 196 01/01/2020 1530   TRIG 44 01/01/2020 1530   HDL 80 01/01/2020 1530   CHOLHDL  2.5 01/01/2020 1530   LDLCALC 108 (H) 01/01/2020 1530  NHDL         116  HEMOGLOBIN A1C Lab Results  Component Value Date   HGBA1C 5.3 07/01/2019   TSH Recent Labs    06/04/19 1051  TSH 1.459   BNP (last 3 results) Recent Labs    06/04/19 1707 08/21/19 1420  BNP 34.2 42.8   External Labs:  06/18/2019: H/H 13/37, PLT 226, MCV 86. Rest of CBC normal.  Ast 20, ALT 13.    Medications and allergies  No Known Allergies   Current Outpatient Medications on File Prior to Visit  Medication Sig Dispense Refill  . aspirin EC 81 MG tablet Take 81 mg by mouth daily.    . Cholecalciferol (VITAMIN D) 50 MCG (2000 UT) tablet Take 2,000 Units by mouth daily.     . furosemide (LASIX) 20 MG tablet Take 1 tablet (20 mg total) by mouth daily as needed for fluid or edema. 90 tablet 1  . hydrocortisone (ANUSOL-HC) 25 MG suppository Place 1 suppository (25 mg total) rectally 2 (two) times daily. 12 suppository 0  . labetalol (NORMODYNE) 100 MG tablet Take 1 tablet (100 mg total) by mouth 2 (two) times daily. 180 tablet 3  . mometasone (NASONEX) 50 MCG/ACT nasal spray USE 2 SPRAYS IN EACH NOSTRIL DAILY AS NEEDED FOR ALLERGIES 51 each 1  . olmesartan-hydrochlorothiazide (BENICAR HCT) 40-25 MG tablet TAKE 1 TABLET BY MOUTH EVERY MORNING 30 tablet 3   No current facility-administered medications on file prior to visit.     Radiology:   Lower Extremity Venous Duplex  10/16/2019: 1. Negative for acute or chronic lower extremity DVT. 2. Negative for bilateral lower extremity saphenous venous valvular incompetence, reflux, or significant varicose vein disease.  Cardiac Studies:   Treadmill Stress 12/16/2013: Indications: Shortness of breath. Conclusions: Negative for ischemia. The patient exercised according to the Bruce protocol, Total time recorded 4 Min. 10 sec. achieving a max heart rate of 137 which was 92% of MPHR for age and 4.9 METS of work. The baseline ECG showed  NSR,low voltage. During exercise there was no ST-T changes of ischemia. Symptoms: Dyspnea. Achieved 91% MPHR. Arrhythmia: None. Baseline  NIBP was 132/82. Peak NIBP was 132/82 MaxSysp was: 174 MaxDiasp was: 90. Mild decrease in functional capacity. Continue primary prevention.  Echocardiogram 06/04/2019:  1. Left ventricular ejection fraction, by estimation, is 60 to 65%. The left ventricle has normal function. The left ventricle has no regional  wall motion abnormalities. Left ventricular diastolic parameters are consistent with Grade II diastolic  dysfunction (pseudonormalization).  2. Right ventricular systolic function is normal. The right ventricular size is normal. There is normal pulmonary artery systolic pressure.  3. Left atrial size was mildly dilated.  4. Anterior fat pad noted in the pericardium.  5. The aortic valve is normal in structure. Aortic valve regurgitation is not visualized. Mild aortic valve sclerosis is present, with no evidence of aortic valve stenosis.   EKG:    EKG 03/13/2011 1: Normal sinus rhythm at rate of 79 bpm, left axis deviation, left anterior fascicular block.  Poor R wave progression, cannot exclude anterolateral infarct old.  Low-voltage precordial leads, pulmonary disease pattern.  No significant change from 06/20/2019.   Assessment     ICD-10-CM   1. Primary hypertension  I10 EKG 12-Lead  2. Dyspnea on exertion  R06.00   3. Bilateral leg edema  R60.0      No orders of the defined types were placed in this encounter.   There are no discontinued medications.  Recommendations:   Tina Ware  is a 79 y.o. AA female with PMH significant for hypertension, prediabetes, hypercholesterolemia, rheumatoid arthritis, hypertension and chronic leg edema presents here for follow-up of hypertension, bilateral leg edema and dyspnea.    Presently doing well, no clinical evidence of heart failure.  Suspect of chronic leg edema could be still venous disease  related, she has complete resolution of leg edema when she wakes up with recurrence during daytime, she is now substituting as a Pharmacist, hospital again.  She has been compliant with wearing support stockings.  She is using furosemide on a as needed basis.  Blood pressure is now very well controlled.  I reviewed her external labs, sodium levels have normalized since discontinuing spironolactone.  Non-HDL cholesterol is at goal.  No changes in the medications were done today.  I will see her back in a year.  She has been enrolled in  Remote Patient Monitoring and Principal Care Management. I will see her back in 6 months.      Adrian Prows, MD, [facility] 03/12/2020, 9:50 AM Office: [phone] Pager: [phone]

## 2020-03-19 DIAGNOSIS — K645 Perianal venous thrombosis: Secondary | ICD-10-CM | POA: Diagnosis not present

## 2020-03-27 DIAGNOSIS — I10 Essential (primary) hypertension: Secondary | ICD-10-CM | POA: Diagnosis not present

## 2020-04-02 DIAGNOSIS — Z1231 Encounter for screening mammogram for malignant neoplasm of breast: Secondary | ICD-10-CM | POA: Diagnosis not present

## 2020-04-02 LAB — HM MAMMOGRAPHY

## 2020-04-09 ENCOUNTER — Encounter: Payer: Self-pay | Admitting: Internal Medicine

## 2020-04-20 ENCOUNTER — Other Ambulatory Visit: Payer: Self-pay | Admitting: Cardiology

## 2020-04-20 DIAGNOSIS — I10 Essential (primary) hypertension: Secondary | ICD-10-CM

## 2020-04-22 ENCOUNTER — Other Ambulatory Visit: Payer: Self-pay | Admitting: Cardiology

## 2020-04-22 DIAGNOSIS — I10 Essential (primary) hypertension: Secondary | ICD-10-CM

## 2020-04-27 DIAGNOSIS — I10 Essential (primary) hypertension: Secondary | ICD-10-CM | POA: Diagnosis not present

## 2020-05-06 DIAGNOSIS — K648 Other hemorrhoids: Secondary | ICD-10-CM | POA: Diagnosis not present

## 2020-05-28 DIAGNOSIS — I10 Essential (primary) hypertension: Secondary | ICD-10-CM | POA: Diagnosis not present

## 2020-06-02 NOTE — Progress Notes (Signed)
Office Visit Note  Patient: Tina Ware             Date of Birth: January 31, 1941           MRN: [id number]             PCP: Glendale Chard, MD Referring: Glendale Chard, MD Visit Date: 06/16/2020 Occupation: '@GUAROCC' @  Subjective:  Numbness in both hands   History of Present Illness: Tina Ware is a 80 y.o. female with history of osteoarthritis.  Patient presents today with ongoing paresthesias in both hands.  She underwent an NCV with EMG on 10/25/2019 which was consistent with severe carpal tunnel syndrome bilaterally.  She had right carpal tunnel release surgery performed by Dr. Durward Fortes previously but her symptoms have returned.  She had left carpal tunnel cortisone injection performed on 12/20/2019 which did not improve her symptoms.  She has also noticed a area of discoloration on the anterior aspect of her left wrist.  She has not experienced any joint pain or joint swelling.  She denies any other joint pain or inflammation at this time.   Activities of Daily Living:  Patient reports morning stiffness for all day.  Patient Denies nocturnal pain.  Difficulty dressing/grooming: Denies Difficulty climbing stairs: Denies Difficulty getting out of chair: Denies Difficulty using hands for taps, buttons, cutlery, and/or writing: Denies  Review of Systems  Constitutional: Positive for fatigue.  HENT: Positive for mouth dryness. Negative for mouth sores and nose dryness.   Eyes: Negative for pain, itching and dryness.  Respiratory: Negative for shortness of breath and difficulty breathing.   Cardiovascular: Negative for chest pain and palpitations.  Gastrointestinal: Negative for blood in stool, constipation and diarrhea.  Endocrine: Negative for increased urination.  Genitourinary: Negative for difficulty urinating.  Musculoskeletal: Positive for arthralgias, joint pain, joint swelling and morning stiffness. Negative for myalgias, muscle tenderness and myalgias.  Skin:  Positive for color change. Negative for rash and redness.  Allergic/Immunologic: Negative for susceptible to infections.  Neurological: Positive for numbness and weakness. Negative for dizziness, headaches and memory loss.  Hematological: Negative for bruising/bleeding tendency.  Psychiatric/Behavioral: Negative for confusion.    PMFS History:  Patient Active Problem List   Diagnosis Date Noted  . Syncope 06/04/2019  . Scalp hematoma 06/04/2019  . Blood in stool 06/04/2019  . Status post trigger finger release 01/24/2019  . Carpal tunnel syndrome, right upper limb 12/27/2018  . Bilateral leg edema 02/12/2018  . Essential hypertension 02/12/2018  . Chronic pain of right knee 02/12/2018  . Hyponatremia 05/03/2017  . Abdominal pain 05/03/2017    Past Medical History:  Diagnosis Date  . Arthritis    RA  . CAD (coronary artery disease)   . Fibroid, uterine   . Hypertension     Family History  Problem Relation Age of Onset  . Heart disease Mother   . Dementia Mother   . Congestive Heart Failure Mother   . Parkinson's disease Father   . Pancreatic cancer Sister    Past Surgical History:  Procedure Laterality Date  . CARPAL TUNNEL RELEASE Right 12/20/2018  . CATARACT EXTRACTION, BILATERAL    . CHOLECYSTECTOMY    . TRIGGER FINGER RELEASE Right 12/20/2018   Social History   Social History Narrative  . Not on file   Immunization History  Administered Date(s) Administered  . Fluad Quad(high Dose 65+) 01/01/2020  . Influenza, High Dose Seasonal PF 01/15/2018, 01/09/2019  . Moderna SARS-COV2 Booster Vaccination 01/24/2020  . Moderna Sars-Covid-2  Vaccination 04/21/2019, 05/19/2019  . Pneumococcal Conjugate-13 12/13/2018  . Tdap 07/06/2017  . Zoster Recombinat (Shingrix) 11/09/2018, 02/28/2019, 03/26/2019     Objective: Vital Signs: BP (!) 149/83 (BP Location: Left Arm, Patient Position: Sitting, Cuff Size: Normal)   Pulse 80   Resp 15   Ht '5\' 2"'  (1.575 m)   Wt 173 lb  (78.5 kg)   BMI 31.64 kg/m    Physical Exam Vitals and nursing note reviewed.  Constitutional:      Appearance: She is well-developed.  HENT:     Head: Normocephalic and atraumatic.  Eyes:     Conjunctiva/sclera: Conjunctivae normal.  Pulmonary:     Effort: Pulmonary effort is normal.  Abdominal:     Palpations: Abdomen is soft.  Musculoskeletal:     Cervical back: Normal range of motion.  Skin:    General: Skin is warm and dry.     Capillary Refill: Capillary refill takes less than 2 seconds.  Neurological:     Mental Status: She is alert and oriented to person, place, and time.  Psychiatric:        Behavior: Behavior normal.      Musculoskeletal Exam: C-spine, thoracic spine, and lumbar spine good ROM with no discomfort.  Shoulder joints, elbow joints, wrist joints, MCPs, PIPs, and DIPs good ROM with no synovitis.  Complete fist formation bilaterally.  Positive Tinel's sign bilaterally.  Hip joints, knee joints, and ankle joints good ROM with no discomfort.  No warmth or effusion of knee joints.  No tenderness or swelling of ankle joints.  CDAI Exam: CDAI Score: -- Patient Global: --; Provider Global: -- Swollen: --; Tender: -- Joint Exam 06/16/2020   No joint exam has been documented for this visit   There is currently no information documented on the homunculus. Go to the Rheumatology activity and complete the homunculus joint exam.  Investigation: No additional findings.  Imaging: No results found.  Recent Labs: Lab Results  Component Value Date   WBC 3.7 01/01/2020   HGB 13.6 01/01/2020   PLT 202 01/01/2020   NA 136 01/01/2020   K 4.2 01/01/2020   CL 99 01/01/2020   CO2 27 01/01/2020   GLUCOSE 94 01/01/2020   BUN 14 01/01/2020   CREATININE 0.89 01/01/2020   BILITOT 0.4 01/01/2020   ALKPHOS 63 01/01/2020   AST 22 01/01/2020   ALT 15 01/01/2020   PROT 5.5 (L) 01/01/2020   ALBUMIN 3.6 (L) 01/01/2020   CALCIUM 9.3 01/01/2020   GFRAA 72 01/01/2020     Speciality Comments: No specialty comments available.  Procedures:  No procedures performed Allergies: Patient has no known allergies.   Assessment / Plan:     Visit Diagnoses: Primary osteoarthritis of both hands: She has no joint tenderness or inflammation on exam.  She is able to make a complete fist bilaterally.  Joint protection and muscle strengthening were discussed. She has been using a stress ball.  She requested a referral to hand therapy, which was placed today.  She was advised to notify us if she develops increased joint pain or joint swelling.  She will follow up in 6 months.   Rheumatoid factor positive - Rheumatoid factor was 61.9 on 06/18/2018.  Anti-CCP was negative and ESR was within normal limits at that time. She has no clinical features of rheumatoid arthritis.  She was advised to notify us if she develops increased joint pain or joint swelling.   Trigger finger, right middle finger - Surgical release performed by  Dr. Durward Fortes Sept 2020.  Flexion contracture of right 3rd PIP joint noted. No tenderness to palpation.   Paresthesia of both hands -She has ongoing paresthesias in both hands.  She underwent NCV with EMG on 10/25/2018 which revealed very severe right median nerve entrapment and severe left median nerve entrapment at the wrist consistent with carpal tunnel syndrome bilaterally.  At that time she was referred to Dr. Durward Fortes and underwent right carpal tunnel release.  Her symptoms have returned in the right hand and have progressed in the left hand.  She underwent left carpal tunnel cortisone injection performed by Dr. Estanislado Pandy on 12/20/2019 which did not improve her symptoms.  Hypopigmentation on the volar aspect of the left wrist was noted.  She has not followed up with Dr. Durward Fortes for further evaluation and management.  She does not want to proceed with surgery at this time.  Different treatment options were discussed.  She requested a referral for hand therapy.   She also requested a referral to neurology to rule out neuropathy.  Plan: Ambulatory referral to Neurology  S/P carpal tunnel release - Right-Performed by Dr. Durward Fortes Sept 2020.  Her symptoms have returned.  She requested a referral to neurology for further evaluation and management.- Plan: Ambulatory referral to Neurology  Carpal tunnel syndrome, left upper limb - Severe evident on NCV with EMG performed on 10/25/18.  She had a left carpal tunnel cortisone injection performed on 12/20/2019 which did not improve her symptoms.  She requested a referral to neurology for further evaluation and management.  She does not want to proceed with surgery at this time.- Plan: Ambulatory referral to Neurology  Primary osteoarthritis of right knee: She is not experiencing any discomfort in her right knee joint at this time. She has good ROM of the right knee with no discomfort.  No warmth or effusion noted.   Osteopenia of multiple sites - DEXA on 03/26/18: right femoral neck BMD 0.628 with T-score -2.3 with -9% change in BMD in left femoral neck.  DEXA ordered by Minette Brine, FNP.  Due to update DEXA.  She continues to take vitamin D 2,000 units daily.   Essential hypertension  Orders: Orders Placed This Encounter  Procedures  . Ambulatory referral to Neurology  . Ambulatory referral to Physical Therapy   No orders of the defined types were placed in this encounter.    Follow-Up Instructions: Return in about 6 months (around 12/17/2020) for Osteoarthritis.   Ofilia Neas, PA-C  Note - This record has been created using Dragon software.  Chart creation errors have been sought, but may not always  have been located. Such creation errors do not reflect on  the standard of medical care.

## 2020-06-05 DIAGNOSIS — K648 Other hemorrhoids: Secondary | ICD-10-CM | POA: Diagnosis not present

## 2020-06-16 ENCOUNTER — Encounter: Payer: Self-pay | Admitting: Physician Assistant

## 2020-06-16 ENCOUNTER — Other Ambulatory Visit: Payer: Self-pay

## 2020-06-16 ENCOUNTER — Ambulatory Visit: Payer: Medicare PPO | Admitting: Physician Assistant

## 2020-06-16 VITALS — BP 149/83 | HR 80 | Resp 15 | Ht 62.0 in | Wt 173.0 lb

## 2020-06-16 DIAGNOSIS — G5602 Carpal tunnel syndrome, left upper limb: Secondary | ICD-10-CM | POA: Diagnosis not present

## 2020-06-16 DIAGNOSIS — M65331 Trigger finger, right middle finger: Secondary | ICD-10-CM

## 2020-06-16 DIAGNOSIS — M19041 Primary osteoarthritis, right hand: Secondary | ICD-10-CM | POA: Diagnosis not present

## 2020-06-16 DIAGNOSIS — R202 Paresthesia of skin: Secondary | ICD-10-CM | POA: Diagnosis not present

## 2020-06-16 DIAGNOSIS — I10 Essential (primary) hypertension: Secondary | ICD-10-CM

## 2020-06-16 DIAGNOSIS — R768 Other specified abnormal immunological findings in serum: Secondary | ICD-10-CM

## 2020-06-16 DIAGNOSIS — M8589 Other specified disorders of bone density and structure, multiple sites: Secondary | ICD-10-CM

## 2020-06-16 DIAGNOSIS — M19042 Primary osteoarthritis, left hand: Secondary | ICD-10-CM

## 2020-06-16 DIAGNOSIS — Z9889 Other specified postprocedural states: Secondary | ICD-10-CM | POA: Diagnosis not present

## 2020-06-16 DIAGNOSIS — M1711 Unilateral primary osteoarthritis, right knee: Secondary | ICD-10-CM

## 2020-06-16 NOTE — Patient Instructions (Signed)
Hand Exercises Hand exercises can be helpful for almost anyone. These exercises can strengthen the hands, improve flexibility and movement, and increase blood flow to the hands. These results can make work and daily tasks easier. Hand exercises can be especially helpful for people who have joint pain from arthritis or have nerve damage from overuse (carpal tunnel syndrome). These exercises can also help people who have injured a hand. Exercises Most of these hand exercises are gentle stretching and motion exercises. It is usually safe to do them often throughout the day. Warming up your hands before exercise may help to reduce stiffness. You can do this with gentle massage or by placing your hands in warm water for 10-15 minutes. It is normal to feel some stretching, pulling, tightness, or mild discomfort as you begin new exercises. This will gradually improve. Stop an exercise right away if you feel sudden, severe pain or your pain gets worse. Ask your health care provider which exercises are best for you. Knuckle bend or "claw" fist 1. Stand or sit with your arm, hand, and all five fingers pointed straight up. Make sure to keep your wrist straight during the exercise. 2. Gently bend your fingers down toward your palm until the tips of your fingers are touching the top of your palm. Keep your big knuckle straight and just bend the small knuckles in your fingers. 3. Hold this position for __________ seconds. 4. Straighten (extend) your fingers back to the starting position. Repeat this exercise 5-10 times with each hand. Full finger fist 1. Stand or sit with your arm, hand, and all five fingers pointed straight up. Make sure to keep your wrist straight during the exercise. 2. Gently bend your fingers into your palm until the tips of your fingers are touching the middle of your palm. 3. Hold this position for __________ seconds. 4. Extend your fingers back to the starting position, stretching every  joint fully. Repeat this exercise 5-10 times with each hand. Straight fist 1. Stand or sit with your arm, hand, and all five fingers pointed straight up. Make sure to keep your wrist straight during the exercise. 2. Gently bend your fingers at the big knuckle, where your fingers meet your hand, and the middle knuckle. Keep the knuckle at the tips of your fingers straight and try to touch the bottom of your palm. 3. Hold this position for __________ seconds. 4. Extend your fingers back to the starting position, stretching every joint fully. Repeat this exercise 5-10 times with each hand. Tabletop 1. Stand or sit with your arm, hand, and all five fingers pointed straight up. Make sure to keep your wrist straight during the exercise. 2. Gently bend your fingers at the big knuckle, where your fingers meet your hand, as far down as you can while keeping the small knuckles in your fingers straight. Think of forming a tabletop with your fingers. 3. Hold this position for __________ seconds. 4. Extend your fingers back to the starting position, stretching every joint fully. Repeat this exercise 5-10 times with each hand. Finger spread 1. Place your hand flat on a table with your palm facing down. Make sure your wrist stays straight as you do this exercise. 2. Spread your fingers and thumb apart from each other as far as you can until you feel a gentle stretch. Hold this position for __________ seconds. 3. Bring your fingers and thumb tight together again. Hold this position for __________ seconds. Repeat this exercise 5-10 times with each hand.   Making circles 1. Stand or sit with your arm, hand, and all five fingers pointed straight up. Make sure to keep your wrist straight during the exercise. 2. Make a circle by touching the tip of your thumb to the tip of your index finger. 3. Hold for __________ seconds. Then open your hand wide. 4. Repeat this motion with your thumb and each finger on your  hand. Repeat this exercise 5-10 times with each hand. Thumb motion 1. Sit with your forearm resting on a table and your wrist straight. Your thumb should be facing up toward the ceiling. Keep your fingers relaxed as you move your thumb. 2. Lift your thumb up as high as you can toward the ceiling. Hold for __________ seconds. 3. Bend your thumb across your palm as far as you can, reaching the tip of your thumb for the small finger (pinkie) side of your palm. Hold for __________ seconds. Repeat this exercise 5-10 times with each hand. Grip strengthening 1. Hold a stress ball or other soft ball in the middle of your hand. 2. Slowly increase the pressure, squeezing the ball as much as you can without causing pain. Think of bringing the tips of your fingers into the middle of your palm. All of your finger joints should bend when doing this exercise. 3. Hold your squeeze for __________ seconds, then relax. Repeat this exercise 5-10 times with each hand.   Contact a health care provider if:  Your hand pain or discomfort gets much worse when you do an exercise.  Your hand pain or discomfort does not improve within 2 hours after you exercise. If you have any of these problems, stop doing these exercises right away. Do not do them again unless your health care provider says that you can. Get help right away if:  You develop sudden, severe hand pain or swelling. If this happens, stop doing these exercises right away. Do not do them again unless your health care provider says that you can. This information is not intended to replace advice given to you by your health care provider. Make sure you discuss any questions you have with your health care provider. Document Revised: 07/05/2018 Document Reviewed: 03/15/2018 Elsevier Patient Education  2021 [employer] Inc.  

## 2020-06-23 ENCOUNTER — Encounter: Payer: Self-pay | Admitting: Neurology

## 2020-06-27 DIAGNOSIS — I10 Essential (primary) hypertension: Secondary | ICD-10-CM | POA: Diagnosis not present

## 2020-07-01 ENCOUNTER — Ambulatory Visit: Payer: Medicare PPO | Admitting: Internal Medicine

## 2020-07-06 ENCOUNTER — Other Ambulatory Visit: Payer: Self-pay

## 2020-07-06 ENCOUNTER — Ambulatory Visit: Payer: Medicare PPO | Attending: Physician Assistant | Admitting: Occupational Therapy

## 2020-07-06 DIAGNOSIS — M25641 Stiffness of right hand, not elsewhere classified: Secondary | ICD-10-CM

## 2020-07-06 DIAGNOSIS — R278 Other lack of coordination: Secondary | ICD-10-CM | POA: Insufficient documentation

## 2020-07-06 DIAGNOSIS — M6281 Muscle weakness (generalized): Secondary | ICD-10-CM | POA: Insufficient documentation

## 2020-07-06 DIAGNOSIS — M25642 Stiffness of left hand, not elsewhere classified: Secondary | ICD-10-CM | POA: Insufficient documentation

## 2020-07-06 DIAGNOSIS — R208 Other disturbances of skin sensation: Secondary | ICD-10-CM | POA: Insufficient documentation

## 2020-07-06 NOTE — Therapy (Signed)
[facility] [address] [place], [place], [address] Phone: [phone]   Fax:  [phone]  Occupational Therapy Evaluation  Patient Details  Name: Tina Ware MRN: [id number] Date of Birth: 1940-07-08 Referring Provider (OT): Hazel Sams, PA-C   Encounter Date: 07/06/2020   OT End of Session - 07/06/20 1515    Visit Number 1    Number of Visits 6    Date for OT Re-Evaluation 08/17/20    Authorization Type [employer] MCR - Awaiting auth (form completed)    OT Start Time 1315    OT Stop Time 1400    OT Time Calculation (min) 45 min    Activity Tolerance Patient tolerated treatment well           Past Medical History:  Diagnosis Date  . Arthritis    RA  . CAD (coronary artery disease)   . Fibroid, uterine   . Hypertension     Past Surgical History:  Procedure Laterality Date  . CARPAL TUNNEL RELEASE Right 12/20/2018  . CATARACT EXTRACTION, BILATERAL    . CHOLECYSTECTOMY    . TRIGGER FINGER RELEASE Right 12/20/2018    There were no vitals filed for this visit.   Subjective Assessment - 07/06/20 1321    Subjective  Mostly, I just have numbness and tightness in both hands but occasionally I will have pain    Pertinent History OA both hands, bilateral CTS, Rt CTR Sept 2020, Rt long finger trigger release Sept 2020    Currently in Pain? No/denies             [facility] OT Assessment - 07/06/20 0001      Assessment   Medical Diagnosis OA both hands, parathesias of both hands from CTS   Rt CTR 11/2018 but symptoms returned, Lt wrist cortisone injection 11/2019 but did not help. Pt also had Rt long finger trigger finger release 11/2018 w/ residual PIP stiffness   Referring Provider (OT) Hazel Sams, PA-C    Onset Date/Surgical Date --   pt has had CTS 10 years, surgery on Rt (CTR) 11/2018   Hand Dominance Right    Prior Therapy Outpatient P.T. 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   Leisure travel      ADL   Eating/Feeding Independent    Grooming Independent    Designer, television/film set - Social research officer, government -  Product/process development scientist [place] care of all shopping needs independently    [place] --   does I'ly, son helps   Meal Prep Plans, prepares and serves adequate meals independently    Programmer, applications own vehicle    Medication Management Is responsible for taking medication in correct dosages at  correct time      Mobility   Mobility Status Independent      Written Expression   Handwriting --   no changes     Observation/Other Assessments   Observations Pt w/ OA as well as Bil CTS. NCV and EMG done on 10/25/19 consistent with severe CTS bilaterally causing numbness/tingling, OA causing stiffness bilateral fingers. Also noted carpal shifting Rt wrist      Sensation   Light Touch Appears Intact    Additional Comments Pt reports bilateral hand numbness along median n. distribution      Coordination   9 Hole Peg Test Right;Left    Right 9 Hole Peg Test 35.84 sec    Left 9 Hole Peg Test 31.84 sec      Edema   Edema mild bilateral hands      ROM / Strength   AROM / PROM / Strength AROM      AROM   Overall AROM Comments BUE AROM WNL's except Rt wrist extension and bilateral composite finger flexion (Lt stiffer than Rt). Rt wrist flex = 55*, ext = 50*. Lt wrist flex = 58*, ext = 60*. Bilateral composite  flexion b/t 80-90% (fluctuates)      Hand Function   Right Hand Grip (lbs) 38 lbs    Right Hand Lateral Pinch 18 lbs    Right Hand 3 Point Pinch 8 lbs   (due to old Rt LF triggering)   Left Hand Grip (lbs) 26    Left Hand Lateral Pinch 16 lbs    Left 3 point pinch 10 lbs                           OT Education - 07/06/20 1514    Education Details OT POC - pt understood that numbness may not improve but O.T. will be able to help with stiffness and joint protection strategies    Person(s) Educated Patient    Methods Explanation    Comprehension Verbalized understanding               OT Long Term Goals - 07/06/20 1523      OT LONG TERM GOAL #1   Title Pt independent with HEP for bilateral hand strength    Time 6    Period Weeks    Status New      OT LONG TERM GOAL #2   Title Pt to verbalize understanding with joint protection techniques, pain reduction techniques, and potential A/E needs to reduce symptoms and increase independence    Time 6    Period Weeks    Status New      OT LONG TERM GOAL #3   Title Pt to verbalize understanding with splint wear and care prn    Time 6    Period Weeks    Status New      OT LONG TERM GOAL #4   Title Pt to improve Lt grip strength to 30 lbs    Baseline Lt grip = 26 lbs (Rt = 38 lbs)    Time 6    Period Weeks    Status New      OT LONG TERM GOAL #5   Title Pt to improve coordination Rt hand by 5 sec    Baseline Rt = 35.84 sec (Lt = 31.84 sec)    Time 6    Period Weeks    Status New  Plan - 07/06/20 1517    Clinical Impression Statement Pt is a 80 y.o. female who presents to OPOT for evaluation with diagnosis: OA of both hands, parathesias of both hands (due to CTS). Pt had previous Rt CTR 11/2018 but symptoms returned, and cortisone injection Lt wrist 11/2019 but did not help resolve symptoms. Pt had NCV and EMG 10/25/19 which was consistent with severe bilateral CTS. Pt also had Rt long  finger trigger release 11/2018 with residual PIP stiffness. Pt also noted to have stiffness in bilateral fingers due to arthritis and noted Rt wrist stiffness (appears to have some carpal shifting). Pt would benefit from O.T. to address stiffness, pain, strength, and address any joint protection strategies and potential A/E needs.    OT Occupational Profile and History Problem Focused Assessment - Including review of records relating to presenting problem    Occupational performance deficits (Please refer to evaluation for details): IADL's;Leisure    Body Structure / Function / Physical Skills Pain;Strength;Dexterity;UE functional use;Edema;Body mechanics;IADL;ROM;Sensation;Coordination;FMC    Rehab Potential Good    Comorbidities Affecting Occupational Performance: May have comorbidities impacting occupational performance    OT Frequency 1x / week    OT Duration 6 weeks    OT Treatment/Interventions Self-care/ADL training;Fluidtherapy;Moist Heat;DME and/or AE instruction;Splinting;Therapeutic activities;Contrast Bath;Therapeutic exercise;Coping strategies training;Passive range of motion;Manual Therapy;Paraffin;Patient/family education    Plan HEP, paraffin    Consulted and Agree with Plan of Care Patient           Patient will benefit from skilled therapeutic intervention in order to improve the following deficits and impairments:   Body Structure / Function / Physical Skills: Pain,Strength,Dexterity,UE functional use,Edema,Body mechanics,IADL,ROM,Sensation,Coordination,FMC       Visit Diagnosis: Stiffness of right hand, not elsewhere classified  Stiffness of left hand, not elsewhere classified  Muscle weakness (generalized)  Other disturbances of skin sensation    Problem List Patient Active Problem List   Diagnosis Date Noted  . Syncope 06/04/2019  . Scalp hematoma 06/04/2019  . Blood in stool 06/04/2019  . Status post trigger finger release 01/24/2019  . Carpal tunnel  syndrome, right upper limb 12/27/2018  . Bilateral leg edema 02/12/2018  . Essential hypertension 02/12/2018  . Chronic pain of right knee 02/12/2018  . Hyponatremia 05/03/2017  . Abdominal pain 05/03/2017    Carey Bullocks, OTR/L 07/06/2020, 3:27 PM  [facility] [address] [place], [place], [address] Phone: [phone]   Fax:  [phone]  Name: Tina Ware MRN: [id number] Date of Birth: 06-02-1940

## 2020-07-13 DIAGNOSIS — K648 Other hemorrhoids: Secondary | ICD-10-CM | POA: Diagnosis not present

## 2020-07-16 ENCOUNTER — Other Ambulatory Visit: Payer: Self-pay | Admitting: Cardiology

## 2020-07-16 DIAGNOSIS — I10 Essential (primary) hypertension: Secondary | ICD-10-CM

## 2020-07-20 ENCOUNTER — Other Ambulatory Visit: Payer: Self-pay

## 2020-07-20 ENCOUNTER — Ambulatory Visit: Payer: Medicare PPO | Admitting: Internal Medicine

## 2020-07-20 ENCOUNTER — Ambulatory Visit: Payer: Medicare PPO | Admitting: Occupational Therapy

## 2020-07-20 ENCOUNTER — Encounter: Payer: Self-pay | Admitting: Internal Medicine

## 2020-07-20 VITALS — BP 126/78 | HR 63 | Temp 98.3°F | Ht 62.0 in | Wt 173.4 lb

## 2020-07-20 DIAGNOSIS — M25641 Stiffness of right hand, not elsewhere classified: Secondary | ICD-10-CM | POA: Diagnosis not present

## 2020-07-20 DIAGNOSIS — M25642 Stiffness of left hand, not elsewhere classified: Secondary | ICD-10-CM

## 2020-07-20 DIAGNOSIS — I10 Essential (primary) hypertension: Secondary | ICD-10-CM | POA: Diagnosis not present

## 2020-07-20 DIAGNOSIS — M6281 Muscle weakness (generalized): Secondary | ICD-10-CM

## 2020-07-20 DIAGNOSIS — K648 Other hemorrhoids: Secondary | ICD-10-CM

## 2020-07-20 DIAGNOSIS — E559 Vitamin D deficiency, unspecified: Secondary | ICD-10-CM | POA: Diagnosis not present

## 2020-07-20 DIAGNOSIS — R208 Other disturbances of skin sensation: Secondary | ICD-10-CM | POA: Diagnosis not present

## 2020-07-20 DIAGNOSIS — K644 Residual hemorrhoidal skin tags: Secondary | ICD-10-CM

## 2020-07-20 DIAGNOSIS — E6609 Other obesity due to excess calories: Secondary | ICD-10-CM

## 2020-07-20 DIAGNOSIS — Z6831 Body mass index (BMI) 31.0-31.9, adult: Secondary | ICD-10-CM

## 2020-07-20 DIAGNOSIS — R278 Other lack of coordination: Secondary | ICD-10-CM | POA: Diagnosis not present

## 2020-07-20 NOTE — Patient Instructions (Signed)

## 2020-07-20 NOTE — Therapy (Signed)
[facility] [address] [place], [place], [address] Phone: [phone]   Fax:  [phone]  Occupational Therapy Treatment  Patient Details  Name: Tina Ware MRN: [id number] Date of Birth: 1940-06-02 Referring Provider (OT): Hazel Sams, PA-C   Encounter Date: 07/20/2020   OT End of Session - 07/20/20 1504    Visit Number 2    Number of Visits 6    Date for OT Re-Evaluation 08/17/20    Authorization Type [employer] MCR - Awaiting auth (form completed)    OT Start Time 1315    OT Stop Time 1400    OT Time Calculation (min) 45 min    Activity Tolerance Patient tolerated treatment well           Past Medical History:  Diagnosis Date  . Arthritis    RA  . CAD (coronary artery disease)   . Fibroid, uterine   . Hypertension     Past Surgical History:  Procedure Laterality Date  . CARPAL TUNNEL RELEASE Right 12/20/2018  . CATARACT EXTRACTION, BILATERAL    . CHOLECYSTECTOMY    . TRIGGER FINGER RELEASE Right 12/20/2018    There were no vitals filed for this visit.   Subjective Assessment - 07/20/20 1330    Subjective  No pain, just numbness and stiffness    Pertinent History OA both hands, bilateral CTS, Rt CTR Sept 2020, Rt long finger trigger release Sept 2020    Currently in Pain? No/denies           Paraffin x 10 min both hands for decreased stiffness. Pt shown how to purchase for home if desired.  Pt issued putty HEP for bilateral hand strengthening and lower median n gliding ex's. Pt given red resistance putty. Pt demo each as indicated w/ initial cues required. Pt also shown wrist/thumb stretch (wrist in ext and thumb in radial abd)                     OT Education - 07/20/20 1343    Education Details Putty ex, wrist ext/thumb stretch, lower median n. gliding ex's    Person(s) Educated Patient    Methods Explanation;Demonstration;Handout    Comprehension Verbalized  understanding;Returned demonstration               OT Long Term Goals - 07/20/20 1505      OT LONG TERM GOAL #1   Title Pt independent with HEP for bilateral hand strength    Time 6    Period Weeks    Status On-going      OT LONG TERM GOAL #2   Title Pt to verbalize understanding with joint protection techniques, pain reduction techniques, and potential A/E needs to reduce symptoms and increase independence    Time 6    Period Weeks    Status New      OT LONG TERM GOAL #3   Title Pt to verbalize understanding with splint wear and care prn    Time 6    Period Weeks    Status New      OT LONG TERM GOAL #4   Title Pt to improve Lt grip strength to 30 lbs    Baseline Lt grip = 26 lbs (Rt = 38 lbs)    Time 6    Period Weeks    Status New      OT LONG TERM GOAL #5   Title Pt to improve coordination Rt hand by 5  sec    Baseline Rt = 35.84 sec (Lt = 31.84 sec)    Time 6    Period Weeks    Status New                 Plan - 07/20/20 1505    Clinical Impression Statement Pt returns today after initial evaluation with good response to paraffin and initial HEP    OT Occupational Profile and History Problem Focused Assessment - Including review of records relating to presenting problem    Occupational performance deficits (Please refer to evaluation for details): IADL's;Leisure    Body Structure / Function / Physical Skills Pain;Strength;Dexterity;UE functional use;Edema;Body mechanics;IADL;ROM;Sensation;Coordination;FMC    Rehab Potential Good    Comorbidities Affecting Occupational Performance: May have comorbidities impacting occupational performance    OT Frequency 1x / week    OT Duration 6 weeks    OT Treatment/Interventions Self-care/ADL training;Fluidtherapy;Moist Heat;DME and/or AE instruction;Splinting;Therapeutic activities;Contrast Bath;Therapeutic exercise;Coping strategies training;Passive range of motion;Manual Therapy;Paraffin;Patient/family education     Plan continue paraffin if desired, pt to bring in braces for assessement, tendon gliding, arthritis booklet, A/E for opening jars prn    Consulted and Agree with Plan of Care Patient           Patient will benefit from skilled therapeutic intervention in order to improve the following deficits and impairments:   Body Structure / Function / Physical Skills: Pain,Strength,Dexterity,UE functional use,Edema,Body mechanics,IADL,ROM,Sensation,Coordination,FMC       Visit Diagnosis: Stiffness of right hand, not elsewhere classified  Stiffness of left hand, not elsewhere classified  Muscle weakness (generalized)    Problem List Patient Active Problem List   Diagnosis Date Noted  . Syncope 06/04/2019  . Scalp hematoma 06/04/2019  . Blood in stool 06/04/2019  . Status post trigger finger release 01/24/2019  . Carpal tunnel syndrome, right upper limb 12/27/2018  . Bilateral leg edema 02/12/2018  . Essential hypertension 02/12/2018  . Chronic pain of right knee 02/12/2018  . Hyponatremia 05/03/2017  . Abdominal pain 05/03/2017    Carey Bullocks, OTR/L 07/20/2020, 3:12 PM  [facility] [address] [place], [place], [address] Phone: [phone]   Fax:  [phone]  Name: Tina Ware MRN: [id number] Date of Birth: [birth year]

## 2020-07-20 NOTE — Patient Instructions (Signed)
   1. Grip Strengthening (Resistive Putty)   Squeeze putty using thumb and all fingers. Repeat _15-20___ times. Do __2__ sessions per day.   2. Do thumb/wrist stretch after this (wrist back/down, stretch thumb out to side) - hold 10-20 sec. Do each side    .

## 2020-07-20 NOTE — Progress Notes (Signed)
I,Katawbba Wiggins,acting as a Education administrator for [name], MD.,have documented all relevant documentation on the behalf of [name], MD,as directed by  [name], MD while in the presence of [name], MD.  This visit occurred during the SARS-CoV-2 public health emergency.  Safety protocols were in place, including screening questions prior to the visit, additional usage of staff PPE, and extensive cleaning of exam room while observing appropriate contact time as indicated for disinfecting solutions.  Subjective:     [name] , female    DOB: [birth year] , 80 y.o.   MRN: [id number]   Chief Complaint  Patient presents with  . Hypertension    HPI  She presents today for HTN f/u. She reports compliance with meds. She denies headaches, chest pain and shortness of breath.   Hypertension This is a chronic problem. The current episode started more than 1 year ago. The problem has been gradually improving since onset. The problem is controlled. Pertinent negatives include no blurred vision, palpitations or shortness of breath. Risk factors for coronary artery disease include sedentary lifestyle, post-menopausal state and obesity. The current treatment provides moderate improvement. Compliance problems include exercise.      Past Medical History:  Diagnosis Date  . Arthritis    RA  . CAD (coronary artery disease)   . Fibroid, uterine   . Hypertension      Family History  Problem Relation Age of Onset  . Heart disease Mother   . Dementia Mother   . Congestive Heart Failure Mother   . Parkinson's disease Father   . Pancreatic cancer Sister      Current Outpatient Medications:  .  aspirin EC 81 MG tablet, Take 81 mg by mouth daily., Disp: , Rfl:  .  Cholecalciferol (VITAMIN D) 50 MCG (2000 UT) tablet, Take 2,000 Units by mouth daily. , Disp: , Rfl:  .  furosemide (LASIX) 20 MG tablet, Take 1 tablet (20 mg total) by mouth daily as needed for fluid  or edema., Disp: 90 tablet, Rfl: 1 .  hydrocortisone (ANUSOL-HC) 25 MG suppository, Place 1 suppository (25 mg total) rectally 2 (two) times daily. (Patient taking differently: Place 25 mg rectally as needed.), Disp: 12 suppository, Rfl: 0 .  mometasone (NASONEX) 50 MCG/ACT nasal spray, USE 2 SPRAYS IN EACH NOSTRIL DAILY AS NEEDED FOR ALLERGIES, Disp: 51 each, Rfl: 1 .  olmesartan-hydrochlorothiazide (BENICAR HCT) 40-25 MG tablet, TAKE 1 TABLET BY MOUTH EVERY DAY IN THE MORNING, Disp: 90 tablet, Rfl: 1 .  labetalol (NORMODYNE) 100 MG tablet, Take 1 tablet (100 mg total) by mouth 2 (two) times daily., Disp: 180 tablet, Rfl: 3   No Known Allergies   Review of Systems  Constitutional: Negative.   Eyes: Negative for blurred vision.  Respiratory: Negative.  Negative for shortness of breath.   Cardiovascular: Negative.  Negative for palpitations.  Gastrointestinal: Negative.        States she has been having issues with hemorrhoids. She is followed by [facility].   Psychiatric/Behavioral: Negative.   All other systems reviewed and are negative.    Today's Vitals   07/20/20 1037  BP: 126/78  Pulse: 63  Temp: 98.3 F (36.8 C)  TempSrc: Oral  Weight: 173 lb 6.4 oz (78.7 kg)  Height: 5\' 2"  (1.575 m)  PainSc: 0-No pain   Body mass index is 31.72 kg/m.   Objective:  Physical Exam Vitals and nursing note reviewed.  Constitutional:  Appearance: Normal appearance. She is obese.  HENT:     Head: Normocephalic and atraumatic.     Nose:     Comments: Masked     Mouth/Throat:     Comments: Masked  Cardiovascular:     Rate and Rhythm: Normal rate and regular rhythm.     Heart sounds: Normal heart sounds.  Pulmonary:     Effort: Pulmonary effort is normal.     Breath sounds: Normal breath sounds.  Musculoskeletal:     Cervical back: Normal range of motion.  Skin:    General: Skin is warm.  Neurological:     General: No focal deficit present.     Mental Status: She  is alert.  Psychiatric:        Mood and Affect: Mood normal.        Behavior: Behavior normal.        Assessment And Plan:     1. Malignant hypertension Comments: Chronic, well controlled. She is encouraged to follow low sodium diet.  I will check renal function.   2. Prolapsed internal hemorrhoids Comments: Recently treated by rubber band ligation in March 2022. She will c/w Anusol cream prn.   3. Vitamin D deficiency disease Comments: I will check vitamin D level and supplement as needed.  - Vitamin D (25 hydroxy)  4. Class 1 obesity due to excess calories with serious comorbidity and body mass index (BMI) of 31.0 to 31.9 in adult Comments:  She is encouraged to strive for BMI less than 29 to decrease cardiac risk. Advised to aim for at least 150 minutes of exercise per week.  Patient was given opportunity to ask questions. Patient verbalized understanding of the plan and was able to repeat key elements of the plan. All questions were answered to their satisfaction.   I, [name], MD, have reviewed all documentation for this visit. The documentation on 07/20/20 for the exam, diagnosis, procedures, and orders are all accurate and complete.   IF YOU HAVE BEEN REFERRED TO A SPECIALIST, IT MAY TAKE 1-2 WEEKS TO SCHEDULE/PROCESS THE REFERRAL. IF YOU HAVE NOT HEARD FROM US/SPECIALIST IN TWO WEEKS, PLEASE GIVE [place] A CALL AT [phone].   THE PATIENT IS ENCOURAGED TO PRACTICE SOCIAL DISTANCING DUE TO THE COVID-19 PANDEMIC.

## 2020-07-21 LAB — VITAMIN D 25 HYDROXY (VIT D DEFICIENCY, FRACTURES): Vit D, 25-Hydroxy: 46.3 ng/mL (ref 30.0–100.0)

## 2020-07-27 ENCOUNTER — Ambulatory Visit: Payer: Medicare PPO | Admitting: Occupational Therapy

## 2020-07-27 DIAGNOSIS — I10 Essential (primary) hypertension: Secondary | ICD-10-CM | POA: Diagnosis not present

## 2020-08-03 ENCOUNTER — Encounter: Payer: Medicare PPO | Admitting: Occupational Therapy

## 2020-08-10 ENCOUNTER — Other Ambulatory Visit: Payer: Self-pay

## 2020-08-10 ENCOUNTER — Ambulatory Visit: Payer: Medicare PPO | Attending: Physician Assistant | Admitting: Occupational Therapy

## 2020-08-10 DIAGNOSIS — M25641 Stiffness of right hand, not elsewhere classified: Secondary | ICD-10-CM | POA: Diagnosis not present

## 2020-08-10 DIAGNOSIS — M25642 Stiffness of left hand, not elsewhere classified: Secondary | ICD-10-CM | POA: Diagnosis not present

## 2020-08-10 DIAGNOSIS — M6281 Muscle weakness (generalized): Secondary | ICD-10-CM | POA: Insufficient documentation

## 2020-08-10 NOTE — Therapy (Signed)
[facility] [address] [place], [place], [address] Phone: [phone]   Fax:  [phone]  Occupational Therapy Treatment  Patient Details  Name: Tina Ware MRN: [id number] Date of Birth: 12-Feb-1941 Referring Provider (OT): Hazel Sams, PA-C   Encounter Date: 08/10/2020   OT End of Session - 08/10/20 1509    Visit Number 3    Number of Visits 6    Date for OT Re-Evaluation 08/17/20    Authorization Type [employer] MCR - Awaiting auth (form completed)    OT Start Time 1320    OT Stop Time 1400    OT Time Calculation (min) 40 min    Activity Tolerance Patient tolerated treatment well           Past Medical History:  Diagnosis Date  . Arthritis    RA  . CAD (coronary artery disease)   . Fibroid, uterine   . Hypertension     Past Surgical History:  Procedure Laterality Date  . CARPAL TUNNEL RELEASE Right 12/20/2018  . CATARACT EXTRACTION, BILATERAL    . CHOLECYSTECTOMY    . TRIGGER FINGER RELEASE Right 12/20/2018    There were no vitals filed for this visit.   Subjective Assessment - 08/10/20 1327    Subjective  No pain, just numbness and stiffness. I might get occaisonal pain when the air pressure is down before a rain/storm    Pertinent History OA both hands, bilateral CTS, Rt CTR Sept 2020, Rt long finger trigger release Sept 2020    Currently in Pain? No/denies                        OT Treatments/Exercises (OP) - 08/10/20 0001      ADLs   ADL Comments Pt issued arthritis booklet and reviewed. Discussed potential A/E needs (various jar openers) and task modifications for now or in future. Provided handouts on jar openers. (pt did not bring in braces to assess)      Modalities   Modalities Fluidotherapy      RUE Fluidotherapy   Number Minutes Fluidotherapy 10 Minutes    RUE Fluidotherapy Location Hand;Wrist    Comments at beginning of session to decrease stiffness      LUE  Fluidotherapy   Number Minutes Fluidotherapy 10 Minutes    LUE Fluidotherapy Location Hand;Wrist    Comments simultaneously with RUE                       OT Long Term Goals - 08/10/20 1508      OT LONG TERM GOAL #1   Title Pt independent with HEP for bilateral hand strength    Time 6    Period Weeks    Status On-going      OT LONG TERM GOAL #2   Title Pt to verbalize understanding with joint protection techniques, pain reduction techniques, and potential A/E needs to reduce symptoms and increase independence    Time 6    Period Weeks    Status On-going      OT LONG TERM GOAL #3   Title Pt to verbalize understanding with splint wear and care prn    Time 6    Period Weeks    Status New      OT LONG TERM GOAL #4   Title Pt to improve Lt grip strength to 30 lbs    Baseline Lt grip = 26 lbs (Rt = 38  lbs)    Time 6    Period Weeks    Status On-going      OT LONG TERM GOAL #5   Title Pt to improve coordination Rt hand by 5 sec    Baseline Rt = 35.84 sec (Lt = 31.84 sec)    Time 6    Period Weeks    Status New                 Plan - 08/10/20 1505    Clinical Impression Statement Pt has had to cancel 2 appointments and returns today for follow up/continued therapy.    OT Occupational Profile and History Problem Focused Assessment - Including review of records relating to presenting problem    Occupational performance deficits (Please refer to evaluation for details): IADL's;Leisure    Body Structure / Function / Physical Skills Pain;Strength;Dexterity;UE functional use;Edema;Body mechanics;IADL;ROM;Sensation;Coordination;FMC    Rehab Potential Good    Comorbidities Affecting Occupational Performance: May have comorbidities impacting occupational performance    OT Frequency 1x / week    OT Duration 6 weeks    OT Treatment/Interventions Self-care/ADL training;Fluidtherapy;Moist Heat;DME and/or AE instruction;Splinting;Therapeutic activities;Contrast  Bath;Therapeutic exercise;Coping strategies training;Passive range of motion;Manual Therapy;Paraffin;Patient/family education    Plan continue paraffin if desired, pt to bring in braces for assessement, tendon gliding, review/assess progress towards goals and potential d/c next session or following session    Consulted and Agree with Plan of Care Patient           Patient will benefit from skilled therapeutic intervention in order to improve the following deficits and impairments:   Body Structure / Function / Physical Skills: Pain,Strength,Dexterity,UE functional use,Edema,Body mechanics,IADL,ROM,Sensation,Coordination,FMC       Visit Diagnosis: Stiffness of right hand, not elsewhere classified  Stiffness of left hand, not elsewhere classified  Muscle weakness (generalized)    Problem List Patient Active Problem List   Diagnosis Date Noted  . Syncope 06/04/2019  . Scalp hematoma 06/04/2019  . Blood in stool 06/04/2019  . Status post trigger finger release 01/24/2019  . Carpal tunnel syndrome, right upper limb 12/27/2018  . Bilateral leg edema 02/12/2018  . Essential hypertension 02/12/2018  . Chronic pain of right knee 02/12/2018  . Hyponatremia 05/03/2017  . Abdominal pain 05/03/2017    Carey Bullocks, OTR/L 08/10/2020, 3:09 PM  [facility] [address] [place], [place], [address] Phone: [phone]   Fax:  [phone]  Name: Tina Ware MRN: [id number] Date of Birth: 08/14/1940

## 2020-08-18 ENCOUNTER — Encounter: Payer: Medicare PPO | Admitting: Neurology

## 2020-08-26 DIAGNOSIS — I10 Essential (primary) hypertension: Secondary | ICD-10-CM | POA: Diagnosis not present

## 2020-08-31 ENCOUNTER — Ambulatory Visit: Payer: Medicare PPO | Admitting: Occupational Therapy

## 2020-09-01 ENCOUNTER — Other Ambulatory Visit: Payer: Self-pay

## 2020-09-01 ENCOUNTER — Ambulatory Visit: Payer: Medicare PPO | Attending: Physician Assistant | Admitting: Occupational Therapy

## 2020-09-01 DIAGNOSIS — M6281 Muscle weakness (generalized): Secondary | ICD-10-CM | POA: Insufficient documentation

## 2020-09-01 DIAGNOSIS — M25641 Stiffness of right hand, not elsewhere classified: Secondary | ICD-10-CM | POA: Insufficient documentation

## 2020-09-01 DIAGNOSIS — M25642 Stiffness of left hand, not elsewhere classified: Secondary | ICD-10-CM | POA: Insufficient documentation

## 2020-09-01 DIAGNOSIS — R278 Other lack of coordination: Secondary | ICD-10-CM | POA: Insufficient documentation

## 2020-09-01 NOTE — Therapy (Signed)
[facility] [address] [place], [place], [address] Phone: [phone]   Fax:  [phone]  Occupational Therapy Treatment  Patient Details  Name: Tina Ware MRN: [id number] Date of Birth: 11-24-1940 Referring Provider (OT): Hazel Sams, PA-C   Encounter Date: 09/01/2020   OT End of Session - 09/01/20 1309    Visit Number 4    Number of Visits 6    Date for OT Re-Evaluation 08/17/20    Authorization Type [employer] MCR - Awaiting auth (form completed)    OT Start Time 1235    OT Stop Time 1308    OT Time Calculation (min) 33 min    Activity Tolerance Patient tolerated treatment well           Past Medical History:  Diagnosis Date  . Arthritis    RA  . CAD (coronary artery disease)   . Fibroid, uterine   . Hypertension     Past Surgical History:  Procedure Laterality Date  . CARPAL TUNNEL RELEASE Right 12/20/2018  . CATARACT EXTRACTION, BILATERAL    . CHOLECYSTECTOMY    . TRIGGER FINGER RELEASE Right 12/20/2018    There were no vitals filed for this visit.   Subjective Assessment - 09/01/20 1309    Subjective  No pain, just numbness and stiffness.    Pertinent History OA both hands, bilateral CTS, Rt CTR Sept 2020, Rt long finger trigger release Sept 2020    Currently in Pain? No/denies           Paraffin x 10 min bilateral hands to decrease stiffness. Issued tendon gliding ex's - pt return demo with mod cueing.  Assessed goals and progress to date - see goal section for updates. Pt brought in wrist brace for Rt hand - appears to fit well. She reports similar one for Lt hand.  Pt requested to end session early due to transportation needs.                      OT Education - 09/01/20 1309    Education Details tendon gliding ex's    Person(s) Educated Patient    Methods Explanation;Demonstration;Handout;Verbal cues    Comprehension Verbalized understanding;Returned  demonstration;Verbal cues required               OT Long Term Goals - 09/01/20 1310      OT LONG TERM GOAL #1   Title Pt independent with HEP for bilateral hand strength    Time 6    Period Weeks    Status Achieved      OT LONG TERM GOAL #2   Title Pt to verbalize understanding with joint protection techniques, pain reduction techniques, and potential A/E needs to reduce symptoms and increase independence    Time 6    Period Weeks    Status Achieved      OT LONG TERM GOAL #3   Title Pt to verbalize understanding with splint wear and care prn    Time 6    Period Weeks    Status Deferred      OT LONG TERM GOAL #4   Title Pt to improve Lt grip strength to 30 lbs    Baseline Lt grip = 26 lbs (Rt = 38 lbs)    Time 6    Period Weeks    Status Not Met   26-28 lbs     OT LONG TERM GOAL #5   Title Pt to improve  coordination Rt hand by 5 sec    Baseline Rt = 35.84 sec (Lt = 31.84 sec)    Time 6    Period Weeks    Status Achieved   29 sec                Plan - 09/01/20 1311    Clinical Impression Statement Pt has met all LTG's except grip strength goal for Lt hand    OT Occupational Profile and History Problem Focused Assessment - Including review of records relating to presenting problem    Occupational performance deficits (Please refer to evaluation for details): IADL's;Leisure    Body Structure / Function / Physical Skills Pain;Strength;Dexterity;UE functional use;Edema;Body mechanics;IADL;ROM;Sensation;Coordination;FMC    Rehab Potential Good    Comorbidities Affecting Occupational Performance: May have comorbidities impacting occupational performance    OT Frequency 1x / week    OT Duration 6 weeks    OT Treatment/Interventions Self-care/ADL training;Fluidtherapy;Moist Heat;DME and/or AE instruction;Splinting;Therapeutic activities;Contrast Bath;Therapeutic exercise;Coping strategies training;Passive range of motion;Manual Therapy;Paraffin;Patient/family  education    Plan D/C O.T.    Consulted and Agree with Plan of Care Patient           Patient will benefit from skilled therapeutic intervention in order to improve the following deficits and impairments:   Body Structure / Function / Physical Skills: Pain,Strength,Dexterity,UE functional use,Edema,Body mechanics,IADL,ROM,Sensation,Coordination,FMC       Visit Diagnosis: Stiffness of right hand, not elsewhere classified  Stiffness of left hand, not elsewhere classified  Muscle weakness (generalized)  Other lack of coordination    Problem List Patient Active Problem List   Diagnosis Date Noted  . Syncope 06/04/2019  . Scalp hematoma 06/04/2019  . Blood in stool 06/04/2019  . Status post trigger finger release 01/24/2019  . Carpal tunnel syndrome, right upper limb 12/27/2018  . Bilateral leg edema 02/12/2018  . Essential hypertension 02/12/2018  . Chronic pain of right knee 02/12/2018  . Hyponatremia 05/03/2017  . Abdominal pain 05/03/2017   OCCUPATIONAL THERAPY DISCHARGE SUMMARY  Visits from Start of Care: 4  Current functional level related to goals / functional outcomes: See above   Remaining deficits: Stiffness and numbness bilateral hands    Education / Equipment: Task modifications, joint protection techniques, A/E recommendation, nerve gliding and tendon gliding HEP, putty HEP, paraffin use for home  Plan: Patient agrees to discharge.  Patient goals were partially met. Patient is being discharged due to meeting the stated rehab goals.  And pt request?????        Carey Bullocks, OTR/L 09/01/2020, 1:13 PM  [facility] [address] [place], [place], [address] Phone: [phone]   Fax:  [phone]  Name: Tina Ware MRN: [id number] Date of Birth: 03-05-1941

## 2020-09-21 ENCOUNTER — Other Ambulatory Visit: Payer: Self-pay

## 2020-09-21 DIAGNOSIS — R202 Paresthesia of skin: Secondary | ICD-10-CM

## 2020-09-21 NOTE — Progress Notes (Signed)
em 

## 2020-09-22 ENCOUNTER — Ambulatory Visit: Payer: Medicare PPO | Admitting: Neurology

## 2020-09-22 ENCOUNTER — Other Ambulatory Visit: Payer: Self-pay

## 2020-09-22 ENCOUNTER — Encounter: Payer: Self-pay | Admitting: Neurology

## 2020-09-22 ENCOUNTER — Other Ambulatory Visit: Payer: Self-pay | Admitting: Cardiology

## 2020-09-22 DIAGNOSIS — G5603 Carpal tunnel syndrome, bilateral upper limbs: Secondary | ICD-10-CM | POA: Diagnosis not present

## 2020-09-22 DIAGNOSIS — I10 Essential (primary) hypertension: Secondary | ICD-10-CM

## 2020-09-22 NOTE — Progress Notes (Signed)
[facility] Clinic Note - Initial Visit   Date: 09/22/20  Tina Ware MRN: [id number] DOB: 18-Jan-1941   Dear Tina Sams, PA-C:  Thank you for your kind referral of Tina Ware for consultation of carpal tunnel syndrome. Although her history is well known to you, please allow [place] to reiterate it for the purpose of our medical record. The patient was accompanied to the clinic by self.  History of Present Illness: Tina Ware is a 80 y.o. right-handed female with RA,CAD, hypertension, and bilateral carpal tunnel syndrome presenting for evaluation of bilateral hand numbness.  For the past several years, she has numbness/tingling in the hands.  EDX from July 2020 shows very severe carpal tunnel syndrome on the right and severe on the left.  She underwent CTS release on the right, however, did not appreciate any improvement.  She continues to have numbness in the thumb, index, and middle finger on both hands.  Sensation in the last two digits are normal.  She uses a grip pad to open jars and bottles.  She was concerned she may have neuropathy so requested neurology evaluation.  She denies numbness/tingling of the feet, leg weakness, or imbalance.   No history of diabetes, alcohol use, family history of neuropathy, or exposure to chemotherapy.  Out-side paper records, electronic medical record, and images have been reviewed where available and summarized as:  NCS/EMG of the upper extremities 10/25/2018: The above electrodiagnostic study is ABNORMAL and reveals evidence of:   1. A very severe right median nerve entrapment at the wrist (carpal tunnel syndrome) affecting sensory and motor components. The lesion is characterized by sensory and motor demyelination with evidence of significant axonal injury.   2. A severe left median nerve entrapment at the wrist (carpal tunnel syndrome) affecting sensory and motor components.   Lab Results  Component  Value Date   HGBA1C 5.3 07/01/2019   Lab Results  Component Value Date   [id number] 742 06/18/2018   Lab Results  Component Value Date   TSH 1.459 06/04/2019   Lab Results  Component Value Date   ESRSEDRATE 2 12/17/2019    Past Medical History:  Diagnosis Date   Arthritis    RA   CAD (coronary artery disease)    Fibroid, uterine    Hypertension     Past Surgical History:  Procedure Laterality Date   CARPAL TUNNEL RELEASE Right 12/20/2018   CATARACT EXTRACTION, BILATERAL     CHOLECYSTECTOMY     TRIGGER FINGER RELEASE Right 12/20/2018     Medications:  Outpatient Encounter Medications as of 09/22/2020  Medication Sig   aspirin EC 81 MG tablet Take 81 mg by mouth daily.   Cholecalciferol (VITAMIN D) 50 MCG (2000 UT) tablet Take 2,000 Units by mouth daily.    furosemide (LASIX) 20 MG tablet Take 1 tablet (20 mg total) by mouth daily as needed for fluid or edema.   hydrocortisone (ANUSOL-HC) 25 MG suppository Place 1 suppository (25 mg total) rectally 2 (two) times daily. (Patient taking differently: Place 25 mg rectally as needed.)   labetalol (NORMODYNE) 100 MG tablet Take 1 tablet (100 mg total) by mouth 2 (two) times daily.   mometasone (NASONEX) 50 MCG/ACT nasal spray USE 2 SPRAYS IN EACH NOSTRIL DAILY AS NEEDED FOR ALLERGIES   olmesartan-hydrochlorothiazide (BENICAR HCT) 40-25 MG tablet TAKE 1 TABLET BY MOUTH EVERY DAY IN THE MORNING   No facility-administered encounter medications on file as of 09/22/2020.    Allergies: No  Known Allergies  Family History: Family History  Problem Relation Age of Onset   Heart disease Mother    Dementia Mother    Congestive Heart Failure Mother    Parkinson's disease Father    Pancreatic cancer Sister     Social History: Social History   Tobacco Use   Smoking status: Never   Smokeless tobacco: Never  Vaping Use   Vaping Use: Never used  Substance Use Topics   Alcohol use: No    Alcohol/week: 0.0 standard drinks    Drug use: No   Social History   Social History Narrative   Right handed   Lives in a one story home    Drinks tea     Vital Signs:  BP (!) 161/90   Pulse 81   Ht 5\' 2"  (1.575 m)   Wt 174 lb (78.9 kg)   SpO2 97%   BMI 31.83 kg/m     Neurological Exam: MENTAL STATUS including orientation to time, place, person, recent and remote memory, attention span and concentration, language, and fund of knowledge is normal.  Speech is not dysarthric.  CRANIAL NERVES: II:  No visual field defects. .   III-IV-VI: Pupils equal round and reactive to light.  Normal conjugate, extra-ocular eye movements in all directions of gaze.  No nystagmus.  No ptosis.   V:  Normal facial sensation.    VII:  Normal facial symmetry and movements.   VIII:  Normal hearing and vestibular function.   IX-X:  Normal palatal movement.   XI:  Normal shoulder shrug and head rotation.   XII:  Normal tongue strength and range of motion, no deviation or fasciculation.  MOTOR:  Mild bilateral R > L ABP atrophy, fasciculations or abnormal movements.  No pronator drift.   Upper Extremity:  Right  Left  Deltoid  5/5   5/5   Biceps  5/5   5/5   Triceps  5/5   5/5   Infraspinatus 5/5  5/5  Medial pectoralis 5/5  5/5  Wrist extensors  5/5   5/5   Wrist flexors  5/5   5/5   Finger extensors  5/5   5/5   Finger flexors  5/5   5/5   Dorsal interossei  5/5   5/5   Abductor pollicis  4/5   4+/5   Tone (Ashworth scale)  0  0   Lower Extremity:  Right  Left  Hip flexors  5/5   5/5   Hip extensors  5/5   5/5   Adductor 5/5  5/5  Abductor 5/5  5/5  Knee flexors  5/5   5/5   Knee extensors  5/5   5/5   Dorsiflexors  5/5   5/5   Plantarflexors  5/5   5/5   Toe extensors  5/5   5/5   Toe flexors  5/5   5/5   Tone (Ashworth scale)  0  0   MSRs:  Right        Left                  brachioradialis 1+  1+  biceps 1+  1+  triceps 1+  1+  patellar 1+  1+  ankle jerk 1+  1+  Hoffman no  no  plantar response down  down    SENSORY:  Normal and symmetric perception of light touch, pinprick, vibration, and proprioception.  Romberg's sign absent.   COORDINATION/GAIT: Normal finger-to- nose-finger and heel-to-shin.  Intact rapid alternating movements bilaterally.   Gait narrow based and stable, unassisted. Tandem and stressed gait intact.    IMPRESSION: Bilateral hand paresthesias are due to carpal tunnel syndrome.  Her electrodiagnostic testing showed severe findings with axonal injury making neural recovery even following surgery very guarded. Patient was reassured that she does not have neuropathy, which she was very pleased to hear. Unfortunately, there is not anything else that can be offered for CTS management and symptoms will be permanent.  Fortunately, she denies significant nerve pain.  She does have joint stiffness and pain from arthritis.    Thank you for allowing me to participate in patient's care.  If I can answer any additional questions, I would be pleased to do so.    Sincerely,    Kervin Bones K. Posey Pronto, DO

## 2020-09-24 DIAGNOSIS — I10 Essential (primary) hypertension: Secondary | ICD-10-CM | POA: Diagnosis not present

## 2020-10-30 DIAGNOSIS — I10 Essential (primary) hypertension: Secondary | ICD-10-CM | POA: Diagnosis not present

## 2020-11-30 DIAGNOSIS — I10 Essential (primary) hypertension: Secondary | ICD-10-CM | POA: Diagnosis not present

## 2020-12-01 NOTE — Progress Notes (Signed)
Office Visit Note  Patient: Tina Ware             Date of Birth: 1940-10-29           MRN: [id number]             PCP: Glendale Chard, MD Referring: Glendale Chard, MD Visit Date: 12/15/2020 Occupation: '@GUAROCC' @  Subjective:  Pain and tingling in both hands.   History of Present Illness: Tina Ware is a 80 y.o. female with a history of osteoarthritis.  She states she continues to have tingling in her bilateral hands.  She had a right carpal tunnel release in 2020 by Dr. Durward Fortes.  He did not notice any improvement after the surgery.  She does not want to have left carpal tunnel release.  She has some stiffness in her hands due to underlying osteoarthritis.  She continues to have right third trigger finger.  Her knee joints are not as bothersome.  She notices intermittent swelling in her hands.  Activities of Daily Living:  Patient reports morning stiffness for 5-10 minutes.   Patient Denies nocturnal pain.  Difficulty dressing/grooming: Denies Difficulty climbing stairs: Denies Difficulty getting out of chair: Denies Difficulty using hands for taps, buttons, cutlery, and/or writing: Denies  Review of Systems  Constitutional:  Negative for fatigue.  HENT:  Positive for mouth dryness and nose dryness. Negative for mouth sores.   Eyes:  Negative for pain, itching and dryness.  Respiratory:  Negative for shortness of breath and difficulty breathing.   Cardiovascular:  Positive for swelling in legs/feet. Negative for chest pain and palpitations.  Gastrointestinal:  Positive for constipation. Negative for blood in stool and diarrhea.  Endocrine: Negative for increased urination.  Genitourinary:  Negative for difficulty urinating.  Musculoskeletal:  Positive for morning stiffness. Negative for joint pain, joint pain, joint swelling, myalgias, muscle tenderness and myalgias.  Skin:  Negative for color change, rash and redness.  Allergic/Immunologic: Negative for  susceptible to infections.  Neurological:  Positive for numbness. Negative for dizziness, headaches, memory loss and weakness.  Hematological:  Negative for bruising/bleeding tendency.  Psychiatric/Behavioral:  Negative for confusion.    PMFS History:  Patient Active Problem List   Diagnosis Date Noted   Syncope 06/04/2019   Scalp hematoma 06/04/2019   Blood in stool 06/04/2019   Status post trigger finger release 01/24/2019   Carpal tunnel syndrome, right upper limb 12/27/2018   Bilateral leg edema 02/12/2018   Essential hypertension 02/12/2018   Chronic pain of right knee 02/12/2018   Hyponatremia 05/03/2017   Abdominal pain 05/03/2017    Past Medical History:  Diagnosis Date   Arthritis    RA   CAD (coronary artery disease)    Fibroid, uterine    Hypertension     Family History  Problem Relation Age of Onset   Heart disease Mother    Dementia Mother    Congestive Heart Failure Mother    Parkinson's disease Father    Pancreatic cancer Sister    Past Surgical History:  Procedure Laterality Date   CARPAL TUNNEL RELEASE Right 12/20/2018   CATARACT EXTRACTION, BILATERAL     CHOLECYSTECTOMY     TRIGGER FINGER RELEASE Right 12/20/2018   Social History   Social History Narrative   Right handed   Lives in a one story home    Drinks tea    Immunization History  Administered Date(s) Administered   Fluad Quad(high Dose 65+) 01/01/2020   Influenza, High Dose Seasonal PF 01/15/2018,  01/09/2019   Moderna SARS-COV2 Booster Vaccination 01/24/2020   Moderna Sars-Covid-2 Vaccination 04/21/2019, 05/19/2019   Pneumococcal Conjugate-13 12/13/2018   Tdap 07/06/2017   Zoster Recombinat (Shingrix) 11/09/2018, 02/28/2019, 03/26/2019     Objective: Vital Signs: BP (!) 166/99 (BP Location: Left Arm, Patient Position: Sitting, Cuff Size: Normal)   Pulse 86   Ht '5\' 2"'  (1.575 m)   Wt 174 lb 12.8 oz (79.3 kg)   BMI 31.97 kg/m    Physical Exam Vitals and nursing note reviewed.   Constitutional:      Appearance: She is well-developed.  HENT:     Head: Normocephalic and atraumatic.  Eyes:     Conjunctiva/sclera: Conjunctivae normal.  Cardiovascular:     Rate and Rhythm: Normal rate and regular rhythm.     Heart sounds: Normal heart sounds.  Pulmonary:     Effort: Pulmonary effort is normal.     Breath sounds: Normal breath sounds.  Abdominal:     General: Bowel sounds are normal.     Palpations: Abdomen is soft.  Musculoskeletal:     Cervical back: Normal range of motion.  Lymphadenopathy:     Cervical: No cervical adenopathy.  Skin:    General: Skin is warm and dry.     Capillary Refill: Capillary refill takes less than 2 seconds.  Neurological:     Mental Status: She is alert and oriented to person, place, and time.  Psychiatric:        Behavior: Behavior normal.     Musculoskeletal Exam: C-spine was in good range of motion.  Shoulder joints, elbow joints, wrist joints, MCPs PIPs and DIPs with good range of motion.  She is contracture of the right third PIP joint.  No tenosynovitis was noted.  Tinel's sign manual compression test was positive bilaterally.  Hip joints and knee joints in good range of motion.  No synovitis was noted.  There was no tenderness over ankles or MTPs.  CDAI Exam: CDAI Score: -- Patient Global: --; Provider Global: -- Swollen: --; Tender: -- Joint Exam 12/15/2020   No joint exam has been documented for this visit   There is currently no information documented on the homunculus. Go to the Rheumatology activity and complete the homunculus joint exam.  Investigation: No additional findings.  Imaging: No results found.  Recent Labs: Lab Results  Component Value Date   WBC 3.7 01/01/2020   HGB 13.6 01/01/2020   PLT 202 01/01/2020   NA 136 01/01/2020   K 4.2 01/01/2020   CL 99 01/01/2020   CO2 27 01/01/2020   GLUCOSE 94 01/01/2020   BUN 14 01/01/2020   CREATININE 0.89 01/01/2020   BILITOT 0.4 01/01/2020    ALKPHOS 63 01/01/2020   AST 22 01/01/2020   ALT 15 01/01/2020   PROT 5.5 (L) 01/01/2020   ALBUMIN 3.6 (L) 01/01/2020   CALCIUM 9.3 01/01/2020   GFRAA 72 01/01/2020    Speciality Comments: No specialty comments available.  Procedures:  No procedures performed Allergies: Patient has no known allergies.   Assessment / Plan:     Visit Diagnoses: Primary osteoarthritis of both hands-she has DIP and PIP thickening bilaterally.  She continues to have some stiffness and discomfort in her hands.  No warmth swelling or effusion was noted.  Joint protection was discussed.  Rheumatoid factor positive - Rheumatoid factor was 61.9 on 06/18/2018.  Anti-CCP was negative and ESR was within normal limits at that time.  She had no synovitis on examination.  Trigger finger,  right middle finger - Surgical release performed by Dr. Durward Fortes Sept 2020.  She is contracture in her right third finger but no active tenosynovitis.  Paresthesia of both hands - NCV with EMG on 10/25/2018 which revealed very severe right median nerve entrapment and severe left median nerve entrapment at the wrist.  S/P carpal tunnel release - Right-Performed by Dr. Durward Fortes Sept 2020.  She is still symptomatic.  Carpal tunnel syndrome, left upper limb - Severe evident on NCV with EMG performed on 10/25/18.  She had a left carpal tunnel cortisone injection performed on 12/20/2019 which did not improve her symptoms.  I had a detailed discussion about getting a repeat injection.  She states she would like to have repeat injection in December.  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  Primary osteoarthritis of right knee-she had no warmth swelling or effusion.  She good range of motion of her right knee joint.  Osteopenia of multiple sites - DEXA on 03/26/18: right femoral neck BMD 0.628 with T-score -2.3 with -9% change in BMD in left femoral neck.  She will be getting repeat DEXA scan by her PCP.  Essential  hypertension-her blood pressure was elevated today.  Advised her to monitor blood pressure closely and follow-up with her PCP.  Orders: No orders of the defined types were placed in this encounter.  No orders of the defined types were placed in this encounter.   Follow-Up Instructions: Return in about 5 months (around 05/17/2021) for Osteoarthritis.   Bo Merino, MD  Note - This record has been created using Editor, commissioning.  Chart creation errors have been sought, but may not always  have been located. Such creation errors do not reflect on  the standard of medical care.

## 2020-12-15 ENCOUNTER — Ambulatory Visit: Payer: Medicare PPO | Admitting: Rheumatology

## 2020-12-15 ENCOUNTER — Encounter: Payer: Self-pay | Admitting: Rheumatology

## 2020-12-15 ENCOUNTER — Other Ambulatory Visit: Payer: Self-pay

## 2020-12-15 VITALS — BP 166/99 | HR 86 | Ht 62.0 in | Wt 174.8 lb

## 2020-12-15 DIAGNOSIS — M19042 Primary osteoarthritis, left hand: Secondary | ICD-10-CM

## 2020-12-15 DIAGNOSIS — R768 Other specified abnormal immunological findings in serum: Secondary | ICD-10-CM

## 2020-12-15 DIAGNOSIS — M65331 Trigger finger, right middle finger: Secondary | ICD-10-CM

## 2020-12-15 DIAGNOSIS — Z9889 Other specified postprocedural states: Secondary | ICD-10-CM | POA: Diagnosis not present

## 2020-12-15 DIAGNOSIS — M8589 Other specified disorders of bone density and structure, multiple sites: Secondary | ICD-10-CM | POA: Diagnosis not present

## 2020-12-15 DIAGNOSIS — I10 Essential (primary) hypertension: Secondary | ICD-10-CM

## 2020-12-15 DIAGNOSIS — M1711 Unilateral primary osteoarthritis, right knee: Secondary | ICD-10-CM

## 2020-12-15 DIAGNOSIS — G5602 Carpal tunnel syndrome, left upper limb: Secondary | ICD-10-CM | POA: Diagnosis not present

## 2020-12-15 DIAGNOSIS — M19041 Primary osteoarthritis, right hand: Secondary | ICD-10-CM | POA: Diagnosis not present

## 2020-12-15 DIAGNOSIS — R202 Paresthesia of skin: Secondary | ICD-10-CM | POA: Diagnosis not present

## 2020-12-15 NOTE — Patient Instructions (Signed)
Hand Exercises Hand exercises can be helpful for almost anyone. These exercises can strengthen the hands, improve flexibility and movement, and increase blood flow to the hands. These results can make work and daily tasks easier. Hand exercises can be especially helpful for people who have joint pain from arthritis or have nerve damage from overuse (carpal tunnel syndrome). These exercises can also help people who have injured a hand. Exercises Most of these hand exercises are gentle stretching and motion exercises. It is usually safe to do them often throughout the day. Warming up your hands before exercise may help to reduce stiffness. You can do this with gentle massage or by placing your hands in warm water for 10-15 minutes. It is normal to feel some stretching, pulling, tightness, or mild discomfort as you begin new exercises. This will gradually improve. Stop an exercise right away if you feel sudden, severe pain or your pain gets worse. Ask your health care provider which exercises are best for you. Knuckle bend or "claw" fist  Stand or sit with your arm, hand, and all five fingers pointed straight up. Make sure to keep your wrist straight during the exercise. Gently bend your fingers down toward your palm until the tips of your fingers are touching the top of your palm. Keep your big knuckle straight and just bend the small knuckles in your fingers. Hold this position for __________ seconds. Straighten (extend) your fingers back to the starting position. Repeat this exercise 5-10 times with each hand. Full finger fist  Stand or sit with your arm, hand, and all five fingers pointed straight up. Make sure to keep your wrist straight during the exercise. Gently bend your fingers into your palm until the tips of your fingers are touching the middle of your palm. Hold this position for __________ seconds. Extend your fingers back to the starting position, stretching every joint fully. Repeat  this exercise 5-10 times with each hand. Straight fist Stand or sit with your arm, hand, and all five fingers pointed straight up. Make sure to keep your wrist straight during the exercise. Gently bend your fingers at the big knuckle, where your fingers meet your hand, and the middle knuckle. Keep the knuckle at the tips of your fingers straight and try to touch the bottom of your palm. Hold this position for __________ seconds. Extend your fingers back to the starting position, stretching every joint fully. Repeat this exercise 5-10 times with each hand. Tabletop  Stand or sit with your arm, hand, and all five fingers pointed straight up. Make sure to keep your wrist straight during the exercise. Gently bend your fingers at the big knuckle, where your fingers meet your hand, as far down as you can while keeping the small knuckles in your fingers straight. Think of forming a tabletop with your fingers. Hold this position for __________ seconds. Extend your fingers back to the starting position, stretching every joint fully. Repeat this exercise 5-10 times with each hand. Finger spread  Place your hand flat on a table with your palm facing down. Make sure your wrist stays straight as you do this exercise. Spread your fingers and thumb apart from each other as far as you can until you feel a gentle stretch. Hold this position for __________ seconds. Bring your fingers and thumb tight together again. Hold this position for __________ seconds. Repeat this exercise 5-10 times with each hand. Making circles  Stand or sit with your arm, hand, and all five fingers pointed   straight up. Make sure to keep your wrist straight during the exercise. Make a circle by touching the tip of your thumb to the tip of your index finger. Hold for __________ seconds. Then open your hand wide. Repeat this motion with your thumb and each finger on your hand. Repeat this exercise 5-10 times with each hand. Thumb  motion  Sit with your forearm resting on a table and your wrist straight. Your thumb should be facing up toward the ceiling. Keep your fingers relaxed as you move your thumb. Lift your thumb up as high as you can toward the ceiling. Hold for __________ seconds. Bend your thumb across your palm as far as you can, reaching the tip of your thumb for the small finger (pinkie) side of your palm. Hold for __________ seconds. Repeat this exercise 5-10 times with each hand. Grip strengthening  Hold a stress ball or other soft ball in the middle of your hand. Slowly increase the pressure, squeezing the ball as much as you can without causing pain. Think of bringing the tips of your fingers into the middle of your palm. All of your finger joints should bend when doing this exercise. Hold your squeeze for __________ seconds, then relax. Repeat this exercise 5-10 times with each hand. Contact a health care provider if: Your hand pain or discomfort gets much worse when you do an exercise. Your hand pain or discomfort does not improve within 2 hours after you exercise. If you have any of these problems, stop doing these exercises right away. Do not do them again unless your health care provider says that you can. Get help right away if: You develop sudden, severe hand pain or swelling. If this happens, stop doing these exercises right away. Do not do them again unless your health care provider says that you can. This information is not intended to replace advice given to you by your health care provider. Make sure you discuss any questions you have with your health care provider. Document Revised: 07/02/2020 Document Reviewed: 07/02/2020 [employer]vier Patient Education  2022 [employer]  

## 2020-12-20 ENCOUNTER — Other Ambulatory Visit: Payer: Self-pay | Admitting: Internal Medicine

## 2020-12-20 ENCOUNTER — Other Ambulatory Visit: Payer: Self-pay | Admitting: Cardiology

## 2020-12-20 DIAGNOSIS — I10 Essential (primary) hypertension: Secondary | ICD-10-CM

## 2020-12-20 DIAGNOSIS — E871 Hypo-osmolality and hyponatremia: Secondary | ICD-10-CM

## 2020-12-21 ENCOUNTER — Other Ambulatory Visit: Payer: Self-pay

## 2020-12-21 DIAGNOSIS — E871 Hypo-osmolality and hyponatremia: Secondary | ICD-10-CM

## 2020-12-21 MED ORDER — FUROSEMIDE 20 MG PO TABS: 20.0000 mg | ORAL_TABLET | Freq: Every day | ORAL | 1 refills | Status: DC

## 2020-12-30 DIAGNOSIS — I10 Essential (primary) hypertension: Secondary | ICD-10-CM | POA: Diagnosis not present

## 2021-01-06 ENCOUNTER — Ambulatory Visit: Payer: Medicare PPO

## 2021-01-06 ENCOUNTER — Encounter: Payer: Self-pay | Admitting: Internal Medicine

## 2021-01-06 ENCOUNTER — Ambulatory Visit (INDEPENDENT_AMBULATORY_CARE_PROVIDER_SITE_OTHER): Payer: Medicare PPO | Admitting: Internal Medicine

## 2021-01-06 ENCOUNTER — Other Ambulatory Visit: Payer: Self-pay

## 2021-01-06 VITALS — BP 138/82 | HR 84 | Temp 98.0°F | Ht 62.0 in | Wt 172.6 lb

## 2021-01-06 DIAGNOSIS — Z0000 Encounter for general adult medical examination without abnormal findings: Secondary | ICD-10-CM

## 2021-01-06 DIAGNOSIS — H6123 Impacted cerumen, bilateral: Secondary | ICD-10-CM | POA: Diagnosis not present

## 2021-01-06 DIAGNOSIS — Z23 Encounter for immunization: Secondary | ICD-10-CM

## 2021-01-06 DIAGNOSIS — R600 Localized edema: Secondary | ICD-10-CM

## 2021-01-06 DIAGNOSIS — R7309 Other abnormal glucose: Secondary | ICD-10-CM | POA: Diagnosis not present

## 2021-01-06 DIAGNOSIS — E6609 Other obesity due to excess calories: Secondary | ICD-10-CM

## 2021-01-06 DIAGNOSIS — I5032 Chronic diastolic (congestive) heart failure: Secondary | ICD-10-CM

## 2021-01-06 DIAGNOSIS — Z6831 Body mass index (BMI) 31.0-31.9, adult: Secondary | ICD-10-CM | POA: Diagnosis not present

## 2021-01-06 DIAGNOSIS — I10 Essential (primary) hypertension: Secondary | ICD-10-CM | POA: Diagnosis not present

## 2021-01-06 DIAGNOSIS — I110 Hypertensive heart disease with heart failure: Secondary | ICD-10-CM | POA: Diagnosis not present

## 2021-01-06 LAB — POCT URINALYSIS DIPSTICK
Blood, UA: NEGATIVE
Glucose, UA: NEGATIVE
Nitrite, UA: NEGATIVE
Protein, UA: POSITIVE — AB
Spec Grav, UA: 1.025 (ref 1.010–1.025)
Urobilinogen, UA: 1 E.U./dL
pH, UA: 7 (ref 5.0–8.0)

## 2021-01-06 LAB — POCT UA - MICROALBUMIN
Albumin/Creatinine Ratio, Urine, POC: <30
Creatinine, POC: 200 mg/dL
Microalbumin Ur, POC: 30 mg/L

## 2021-01-06 NOTE — Patient Instructions (Signed)
Health Maintenance, Female Adopting a healthy lifestyle and getting preventive care are important in promoting health and wellness. Ask your health care provider about: The right schedule for you to have regular tests and exams. Things you can do on your own to prevent diseases and keep yourself healthy. What should I know about diet, weight, and exercise? Eat a healthy diet  Eat a diet that includes plenty of vegetables, fruits, low-fat dairy products, and lean protein. Do not eat a lot of foods that are high in solid fats, added sugars, or sodium. Maintain a healthy weight Body mass index (BMI) is used to identify weight problems. It estimates body fat based on height and weight. Your health care provider can help determine your BMI and help you achieve or maintain a healthy weight. Get regular exercise Get regular exercise. This is one of the most important things you can do for your health. Most adults should: Exercise for at least 150 minutes each week. The exercise should increase your heart rate and make you sweat (moderate-intensity exercise). Do strengthening exercises at least twice a week. This is in addition to the moderate-intensity exercise. Spend less time sitting. Even light physical activity can be beneficial. Watch cholesterol and blood lipids Have your blood tested for lipids and cholesterol at 79 years of age, then have this test every 5 years. Have your cholesterol levels checked more often if: Your lipid or cholesterol levels are high. You are older than 79 years of age. You are at high risk for heart disease. What should I know about cancer screening? Depending on your health history and family history, you may need to have cancer screening at various ages. This may include screening for: Breast cancer. Cervical cancer. Colorectal cancer. Skin cancer. Lung cancer. What should I know about heart disease, diabetes, and high blood pressure? Blood pressure and heart  disease High blood pressure causes heart disease and increases the risk of stroke. This is more likely to develop in people who have high blood pressure readings, are of African descent, or are overweight. Have your blood pressure checked: Every 3-5 years if you are 18-39 years of age. Every year if you are 40 years old or older. Diabetes Have regular diabetes screenings. This checks your fasting blood sugar level. Have the screening done: Once every three years after age 40 if you are at a normal weight and have a low risk for diabetes. More often and at a younger age if you are overweight or have a high risk for diabetes. What should I know about preventing infection? Hepatitis B If you have a higher risk for hepatitis B, you should be screened for this virus. Talk with your health care provider to find out if you are at risk for hepatitis B infection. Hepatitis C Testing is recommended for: Everyone born from 1945 through 1965. Anyone with known risk factors for hepatitis C. Sexually transmitted infections (STIs) Get screened for STIs, including gonorrhea and chlamydia, if: You are sexually active and are younger than 79 years of age. You are older than 79 years of age and your health care provider tells you that you are at risk for this type of infection. Your sexual activity has changed since you were last screened, and you are at increased risk for chlamydia or gonorrhea. Ask your health care provider if you are at risk. Ask your health care provider about whether you are at high risk for HIV. Your health care provider may recommend a prescription medicine   to help prevent HIV infection. If you choose to take medicine to prevent HIV, you should first get tested for HIV. You should then be tested every 3 months for as long as you are taking the medicine. Pregnancy If you are about to stop having your period (premenopausal) and you may become pregnant, seek counseling before you get  pregnant. Take 400 to 800 micrograms (mcg) of folic acid every day if you become pregnant. Ask for birth control (contraception) if you want to prevent pregnancy. Osteoporosis and menopause Osteoporosis is a disease in which the bones lose minerals and strength with aging. This can result in bone fractures. If you are 65 years old or older, or if you are at risk for osteoporosis and fractures, ask your health care provider if you should: Be screened for bone loss. Take a calcium or vitamin D supplement to lower your risk of fractures. Be given hormone replacement therapy (HRT) to treat symptoms of menopause. Follow these instructions at home: Lifestyle Do not use any products that contain nicotine or tobacco, such as cigarettes, e-cigarettes, and chewing tobacco. If you need help quitting, ask your health care provider. Do not use street drugs. Do not share needles. Ask your health care provider for help if you need support or information about quitting drugs. Alcohol use Do not drink alcohol if: Your health care provider tells you not to drink. You are pregnant, may be pregnant, or are planning to become pregnant. If you drink alcohol: Limit how much you use to 0-1 drink a day. Limit intake if you are breastfeeding. Be aware of how much alcohol is in your drink. In the [place] one drink equals one 12 oz bottle of beer (355 mL), one 5 oz glass of wine (148 mL), or one 1 oz glass of hard liquor (44 mL). General instructions Schedule regular health, dental, and eye exams. Stay current with your vaccines. Tell your health care provider if: You often feel depressed. You have ever been abused or do not feel safe at home. Summary Adopting a healthy lifestyle and getting preventive care are important in promoting health and wellness. Follow your health care provider's instructions about healthy diet, exercising, and getting tested or screened for diseases. Follow your health care provider's  instructions on monitoring your cholesterol and blood pressure. This information is not intended to replace advice given to you by your health care provider. Make sure you discuss any questions you have with your health care provider. Document Revised: 05/22/2020 Document Reviewed: 03/07/2018 Elsevier Patient Education  2022 [employer] Inc[employer]  

## 2021-01-06 NOTE — Progress Notes (Deleted)
I,Quest Tavenner T Dmoni Fortson,acting as a scribe for [name], MD.,have documented all relevant documentation on the behalf of [name], MD,as directed by  [name], MD while in the presence of [name], MD.  This visit occurred during the SARS-CoV-2 public health emergency.  Safety protocols were in place, including screening questions prior to the visit, additional usage of staff PPE, and extensive cleaning of exam room while observing appropriate contact time as indicated for disinfecting solutions.  Subjective:     [name] , female    DOB: 02/17/1941 , 80 y.o.   MRN: [id number]   No chief complaint on file.   HPI  She is here today for a full physical examination.  She is no longer followed by GYN. She reports compliance with meds. Denies headaches, chest pain and shortness of breath.   Hypertension This is a chronic problem. The current episode started more than 1 year ago. The problem has been gradually improving since onset. The problem is controlled. Pertinent negatives include no blurred vision, chest pain, palpitations or shortness of breath. Risk factors for coronary artery disease include obesity, post-menopausal state and sedentary lifestyle. Past treatments include angiotensin blockers and diuretics. The current treatment provides moderate improvement. Compliance problems include exercise.     Past Medical History:  Diagnosis Date  . Arthritis    RA  . CAD (coronary artery disease)   . Fibroid, uterine   . Hypertension      Family History  Problem Relation Age of Onset  . Heart disease Mother   . Dementia Mother   . Congestive Heart Failure Mother   . Parkinson's disease Father   . Pancreatic cancer Sister      Current Outpatient Medications:  .  aspirin EC 81 MG tablet, Take 81 mg by mouth daily., Disp: , Rfl:  .  Cholecalciferol (VITAMIN D) 50 MCG (2000 UT) tablet, Take 2,000 Units by mouth daily. , Disp: , Rfl:  .   furosemide (LASIX) 20 MG tablet, Take 1 tablet (20 mg total) by mouth daily., Disp: 90 tablet, Rfl: 1 .  hydrocortisone (ANUSOL-HC) 25 MG suppository, Place 1 suppository (25 mg total) rectally 2 (two) times daily. (Patient taking differently: Place 25 mg rectally as needed.), Disp: 12 suppository, Rfl: 0 .  labetalol (NORMODYNE) 100 MG tablet, TAKE 1 TABLET BY MOUTH TWICE A DAY, Disp: 180 tablet, Rfl: 3 .  mometasone (NASONEX) 50 MCG/ACT nasal spray, USE 2 SPRAYS IN EACH NOSTRIL DAILY AS NEEDED FOR ALLERGIES, Disp: 51 each, Rfl: 1 .  olmesartan-hydrochlorothiazide (BENICAR HCT) 40-25 MG tablet, TAKE 1 TABLET BY MOUTH EVERY DAY IN THE MORNING, Disp: 90 tablet, Rfl: 1   No Known Allergies    The patient states she uses {contraceptive methods:5051} for birth control. Last LMP was No LMP recorded. Patient is postmenopausal.. {Dysmenorrhea-menorrhagia:21918}. Negative for: breast discharge, breast lump(s), breast pain and breast self exam. Associated symptoms include abnormal vaginal bleeding. Pertinent negatives include abnormal bleeding (hematology), anxiety, decreased libido, depression, difficulty falling sleep, dyspareunia, history of infertility, nocturia, sexual dysfunction, sleep disturbances, urinary incontinence, urinary urgency, vaginal discharge and vaginal itching. Diet regular.The patient states her exercise level is    . The patient's tobacco use is:  Social History   Tobacco Use  Smoking Status Never  Smokeless Tobacco Never  . She has been exposed to passive smoke. The patient's alcohol use is:  Social History   Substance and Sexual Activity  Alcohol Use No  .  Alcohol/week: 0.0 standard drinks  . Additional information: Last pap ***, next one scheduled for ***.    Review of Systems  Eyes:  Negative for blurred vision.  Respiratory:  Negative for shortness of breath.   Cardiovascular:  Negative for chest pain and palpitations.    There were no vitals filed for this  visit. There is no height or weight on file to calculate BMI.   Objective:  Physical Exam      Assessment And Plan:     There are no diagnoses linked to this encounter.    Patient was given opportunity to ask questions. Patient verbalized understanding of the plan and was able to repeat key elements of the plan. All questions were answered to their satisfaction.   Debbora Dus, CMA   I, Debbora Dus, CMA, have reviewed all documentation for this visit. The documentation on 01/06/21 for the exam, diagnosis, procedures, and orders are all accurate and complete.  THE PATIENT IS ENCOURAGED TO PRACTICE SOCIAL DISTANCING DUE TO THE COVID-19 PANDEMIC.

## 2021-01-06 NOTE — Progress Notes (Signed)
This visit occurred during the SARS-CoV-2 public health emergency.  Safety protocols were in place, including screening questions prior to the visit, additional usage of staff PPE, and extensive cleaning of exam room while observing appropriate contact time as indicated for disinfecting solutions.  Subjective:     [name] , female    DOB: 10/06/1940 , 80 y.o.   MRN: [id number]   Chief Complaint  Patient presents with   Annual Exam   Hypertension    HPI  She is here today for a full physical examination.  She is no longer followed by GYN. She reports compliance with meds. Denies headaches, chest pain and shortness of breath.   Hypertension This is a chronic problem. The current episode started more than 1 year ago. The problem has been gradually improving since onset. The problem is controlled. Pertinent negatives include no blurred vision, chest pain, palpitations or shortness of breath. Risk factors for coronary artery disease include obesity, post-menopausal state and sedentary lifestyle. Past treatments include angiotensin blockers and diuretics. The current treatment provides moderate improvement. Compliance problems include exercise.     Past Medical History:  Diagnosis Date   Arthritis    RA   CAD (coronary artery disease)    Fibroid, uterine    Hypertension      Family History  Problem Relation Age of Onset   Heart disease Mother    Dementia Mother    Congestive Heart Failure Mother    Parkinson's disease Father    Pancreatic cancer Sister      Current Outpatient Medications:    Cholecalciferol (VITAMIN D) 50 MCG (2000 UT) tablet, Take 2,000 Units by mouth daily. , Disp: , Rfl:    furosemide (LASIX) 20 MG tablet, Take 1 tablet (20 mg total) by mouth daily., Disp: 90 tablet, Rfl: 1   hydrocortisone (ANUSOL-HC) 25 MG suppository, Place 1 suppository (25 mg total) rectally 2 (two) times daily. (Patient taking differently: Place 25 mg rectally as  needed.), Disp: 12 suppository, Rfl: 0   labetalol (NORMODYNE) 100 MG tablet, TAKE 1 TABLET BY MOUTH TWICE A DAY, Disp: 180 tablet, Rfl: 3   mometasone (NASONEX) 50 MCG/ACT nasal spray, USE 2 SPRAYS IN EACH NOSTRIL DAILY AS NEEDED FOR ALLERGIES, Disp: 51 each, Rfl: 1   olmesartan-hydrochlorothiazide (BENICAR HCT) 40-25 MG tablet, TAKE 1 TABLET BY MOUTH EVERY DAY IN THE MORNING, Disp: 90 tablet, Rfl: 1   aspirin EC 81 MG tablet, Take 81 mg by mouth daily., Disp: , Rfl:    No Known Allergies   The patient states she uses post menopausal status for birth control. Last LMP was No LMP recorded. Patient is postmenopausal.. Negative for Dysmenorrhea. Negative for: breast discharge, breast lump(s), breast pain and breast self exam. Associated symptoms include abnormal vaginal bleeding. Pertinent negatives include abnormal bleeding (hematology), anxiety, decreased libido, depression, difficulty falling sleep, dyspareunia, history of infertility, nocturia, sexual dysfunction, sleep disturbances, urinary incontinence, urinary urgency, vaginal discharge and vaginal itching. Diet regular.The patient states her exercise level is  moderate.  . The patient's tobacco use is:  Social History   Tobacco Use  Smoking Status Never  Smokeless Tobacco Never  . She has been exposed to passive smoke. The patient's alcohol use is:  Social History   Substance and Sexual Activity  Alcohol Use No   Alcohol/week: 0.0 standard drinks   Review of Systems  Constitutional: Negative.   HENT: Negative.    Eyes: Negative.  Negative for blurred vision.  Respiratory:  Negative.  Negative for shortness of breath.   Cardiovascular: Negative.  Negative for chest pain and palpitations.  Gastrointestinal: Negative.   Endocrine: Negative.   Genitourinary: Negative.   Musculoskeletal: Negative.   Skin: Negative.   Allergic/Immunologic: Negative.   Neurological: Negative.   Hematological: Negative.   Psychiatric/Behavioral:  Negative.      Today's Vitals   01/06/21 1106  BP: 138/82  Pulse: 84  Temp: 98 F (36.7 C)  Weight: 172 lb 9.6 oz (78.3 kg)  Height: '5\' 2"'  (1.575 m)  PainSc: 0-No pain   Body mass index is 31.57 kg/m.  Wt Readings from Last 3 Encounters:  01/14/21 174 lb 3.2 oz (79 kg)  01/06/21 172 lb 9.6 oz (78.3 kg)  12/15/20 174 lb 12.8 oz (79.3 kg)    Objective:  Physical Exam Constitutional:      General: She is not in acute distress.    Appearance: Normal appearance. She is well-developed. She is obese.  HENT:     Head: Normocephalic and atraumatic.     Right Ear: Hearing, ear canal and external ear normal. There is impacted cerumen.     Left Ear: Hearing, ear canal and external ear normal. There is impacted cerumen.     Nose:     Comments: Deferred - masked    Mouth/Throat:     Comments: Deferred - masked Eyes:     General: Lids are normal.     Extraocular Movements: Extraocular movements intact.     Conjunctiva/sclera: Conjunctivae normal.     Pupils: Pupils are equal, round, and reactive to light.     Funduscopic exam:    Right eye: No papilledema.        Left eye: No papilledema.  Neck:     Thyroid: No thyroid mass.     Vascular: No carotid bruit.  Cardiovascular:     Rate and Rhythm: Normal rate and regular rhythm.     Pulses: Normal pulses.     Heart sounds: Normal heart sounds. No murmur heard. Pulmonary:     Effort: Pulmonary effort is normal.     Breath sounds: Normal breath sounds.  Chest:     Chest wall: No mass.  Breasts:    Tanner Score is 5.     Right: Normal. No mass or tenderness.     Left: Normal. No mass or tenderness.  Abdominal:     General: Abdomen is flat. Bowel sounds are normal. There is no distension.     Palpations: Abdomen is soft.     Tenderness: There is no abdominal tenderness.  Genitourinary:    Rectum: Guaiac result negative.  Musculoskeletal:        General: No swelling. Normal range of motion.     Cervical back: Full passive  range of motion without pain, normal range of motion and neck supple.     Right lower leg: Edema present.     Left lower leg: Edema present.  Lymphadenopathy:     Upper Body:     Right upper body: No supraclavicular, axillary or pectoral adenopathy.     Left upper body: No supraclavicular, axillary or pectoral adenopathy.  Skin:    General: Skin is warm and dry.     Capillary Refill: Capillary refill takes less than 2 seconds.  Neurological:     General: No focal deficit present.     Mental Status: She is alert and oriented to person, place, and time.     Cranial Nerves: No cranial nerve  deficit.     Sensory: No sensory deficit.  Psychiatric:        Mood and Affect: Mood normal.        Behavior: Behavior normal.        Thought Content: Thought content normal.        Judgment: Judgment normal.        Assessment And Plan:     1. Routine general medical examination at a health care facility Comments: A full exam was performed. Importance of monthly self breast exams was discussed with the patient. PATIENT IS ADVISED TO GET 30-45 MINUTES REGULAR EXERCISE NO LESS THAN FOUR TO FIVE DAYS PER WEEK - BOTH WEIGHTBEARING EXERCISES AND AEROBIC ARE RECOMMENDED.  PATIENT IS ADVISED TO FOLLOW A HEALTHY DIET WITH AT LEAST SIX FRUITS/VEGGIES PER DAY, DECREASE INTAKE OF RED MEAT, AND TO INCREASE FISH INTAKE TO TWO DAYS PER WEEK.  MEATS/FISH SHOULD NOT BE FRIED, BAKED OR BROILED IS PREFERABLE.  IT IS ALSO IMPORTANT TO CUT BACK ON YOUR SUGAR INTAKE. PLEASE AVOID ANYTHING WITH ADDED SUGAR, CORN SYRUP OR OTHER SWEETENERS. IF YOU MUST USE A SWEETENER, YOU CAN TRY STEVIA. IT IS ALSO IMPORTANT TO AVOID ARTIFICIALLY SWEETENERS AND DIET BEVERAGES. LASTLY, I SUGGEST WEARING SPF 50 SUNSCREEN ON EXPOSED PARTS AND ESPECIALLY WHEN IN THE DIRECT SUNLIGHT FOR AN EXTENDED PERIOD OF TIME.  PLEASE AVOID FAST FOOD RESTAURANTS AND INCREASE YOUR WATER INTAKE.  2. Hypertensive heart disease with chronic diastolic congestive  heart failure (HCC) Comments: Chronic, fair control. Goal BP is less than 130/80. Encouraged to follow low sodium diet. Importance of med/dietary compliance was stressed to the patient. EKG performed, NSR w/o acute changes. Does not appear to be volume overloaded at this time. She will f/u in 4-6 months for re-evaluation.  - CMP14+EGFR - CBC - Lipid panel - POCT Urinalysis Dipstick (81002) - POCT UA - Microalbumin - EKG 12-Lead  3. Bilateral impacted cerumen Comments: Unfortunately, her ears were not flushed prior to her departure. Will contact her to return for a nurse visit.   4. Other abnormal glucose HER A1C HAS BEEN ELEVATED IN THE PAST. I WILL CHECK AN A1C, BMET TODAY. SHE WAS ENCOURAGED TO AVOID SUGARY BEVERAGES AND PROCESSED FOODS INCLUDING BREADS, RICE AND PASTA. - Hemoglobin A1c  5. Bilateral leg edema Comments: Chronic, encouraged to wear support hose daily and elevate legs when seated. This has actually improved since her last visit.   6. Class 1 obesity due to excess calories without serious comorbidity with body mass index (BMI) of 31.0 to 31.9 in adult Comments: She is encouraged to strive for BMI </= 29 to decrease cardiac risk. Advised to aim for at least 150 minutes of exercise per week.   7. Immunization due Comments: She was given high dose flu vaccine.  - Flu Vaccine QUAD High Dose(Fluad)  Patient was given opportunity to ask questions. Patient verbalized understanding of the plan and was able to repeat key elements of the plan. All questions were answered to their satisfaction.   I, [name], MD, have reviewed all documentation for this visit. The documentation on 01/16/21 for the exam, diagnosis, procedures, and orders are all accurate and complete.   THE PATIENT IS ENCOURAGED TO PRACTICE SOCIAL DISTANCING DUE TO THE COVID-19 PANDEMIC.

## 2021-01-07 LAB — CBC
Hematocrit: 42.7 % (ref 34.0–46.6)
Hemoglobin: 14.7 g/dL (ref 11.1–15.9)
MCH: 29.1 pg (ref 26.6–33.0)
MCHC: 34.4 g/dL (ref 31.5–35.7)
MCV: 85 fL (ref 79–97)
Platelets: 200 10*3/uL (ref 150–450)
RBC: 5.05 x10E6/uL (ref 3.77–5.28)
RDW: 13.2 % (ref 11.7–15.4)
WBC: 4.1 10*3/uL (ref 3.4–10.8)

## 2021-01-07 LAB — CMP14+EGFR
ALT: 12 IU/L (ref 0–32)
AST: 17 IU/L (ref 0–40)
Albumin/Globulin Ratio: 2 (ref 1.2–2.2)
Albumin: 3.6 g/dL — ABNORMAL LOW (ref 3.7–4.7)
Alkaline Phosphatase: 76 IU/L (ref 44–121)
BUN/Creatinine Ratio: 15 (ref 12–28)
BUN: 11 mg/dL (ref 8–27)
Bilirubin Total: 0.3 mg/dL (ref 0.0–1.2)
CO2: 27 mmol/L (ref 20–29)
Calcium: 9.6 mg/dL (ref 8.7–10.3)
Chloride: 97 mmol/L (ref 96–106)
Creatinine, Ser: 0.75 mg/dL (ref 0.57–1.00)
Globulin, Total: 1.8 g/dL (ref 1.5–4.5)
Glucose: 101 mg/dL — ABNORMAL HIGH (ref 70–99)
Potassium: 4 mmol/L (ref 3.5–5.2)
Sodium: 137 mmol/L (ref 134–144)
Total Protein: 5.4 g/dL — ABNORMAL LOW (ref 6.0–8.5)
eGFR: 81 mL/min/{1.73_m2} (ref >59)

## 2021-01-07 LAB — LIPID PANEL
Chol/HDL Ratio: 2.5 ratio (ref 0.0–4.4)
Cholesterol, Total: 179 mg/dL (ref 100–199)
HDL: 72 mg/dL (ref >39)
LDL Chol Calc (NIH): 94 mg/dL (ref 0–99)
Triglycerides: 69 mg/dL (ref 0–149)
VLDL Cholesterol Cal: 13 mg/dL (ref 5–40)

## 2021-01-07 LAB — HEMOGLOBIN A1C
Est. average glucose Bld gHb Est-mCnc: 117 mg/dL
Hgb A1c MFr Bld: 5.7 % — ABNORMAL HIGH (ref 4.8–5.6)

## 2021-01-08 ENCOUNTER — Telehealth: Payer: Self-pay

## 2021-01-08 NOTE — Telephone Encounter (Signed)
Lvm. Pt needs to come to have her ears cleaned.

## 2021-01-14 ENCOUNTER — Ambulatory Visit (INDEPENDENT_AMBULATORY_CARE_PROVIDER_SITE_OTHER): Payer: Medicare PPO

## 2021-01-14 VITALS — BP 126/78 | HR 78 | Temp 98.3°F | Ht 62.4 in | Wt 174.2 lb

## 2021-01-14 DIAGNOSIS — Z0000 Encounter for general adult medical examination without abnormal findings: Secondary | ICD-10-CM

## 2021-01-14 NOTE — Patient Instructions (Signed)
Tina Ware , Thank you for taking time to come for your Medicare Wellness Visit. I appreciate your ongoing commitment to your health goals. Please review the following plan we discussed and let me know if I can assist you in the future.   Screening recommendations/referrals: Colonoscopy: not required Mammogram: completed 04/02/2020 Bone Density: completed 03/26/2018 Recommended yearly ophthalmology/optometry visit for glaucoma screening and checkup Recommended yearly dental visit for hygiene and checkup  Vaccinations: Influenza vaccine: completed 01/06/2021 Pneumococcal vaccine: declines at this time Tdap vaccine: completed 07/06/2017, due 07/07/2027 Shingles vaccine: completed   Covid-19: 08/25/2020, 01/24/2020, 05/19/2019, 04/21/2019  Advanced directives: Advance directive discussed with you today. I have provided a copy for you to complete at home and have notarized. Once this is complete please bring a copy in to our office so we can scan it into your chart.  Conditions/risks identified: none  Next appointment: Follow up in one year for your annual wellness visit    Preventive Care 65 Years and Older, Female Preventive care refers to lifestyle choices and visits with your health care provider that can promote health and wellness. What does preventive care include? A yearly physical exam. This is also called an annual well check. Dental exams once or twice a year. Routine eye exams. Ask your health care provider how often you should have your eyes checked. Personal lifestyle choices, including: Daily care of your teeth and gums. Regular physical activity. Eating a healthy diet. Avoiding tobacco and drug use. Limiting alcohol use. Practicing safe sex. Taking low-dose aspirin every day. Taking vitamin and mineral supplements as recommended by your health care provider. What happens during an annual well check? The services and screenings done by your health care provider during  your annual well check will depend on your age, overall health, lifestyle risk factors, and family history of disease. Counseling  Your health care provider may ask you questions about your: Alcohol use. Tobacco use. Drug use. Emotional well-being. Home and relationship well-being. Sexual activity. Eating habits. History of falls. Memory and ability to understand (cognition). Work and work Statistician. Reproductive health. Screening  You may have the following tests or measurements: Height, weight, and BMI. Blood pressure. Lipid and cholesterol levels. These may be checked every 5 years, or more frequently if you are over 35 years old. Skin check. Lung cancer screening. You may have this screening every year starting at age 33 if you have a 30-pack-year history of smoking and currently smoke or have quit within the past 15 years. Fecal occult blood test (FOBT) of the stool. You may have this test every year starting at age 54. Flexible sigmoidoscopy or colonoscopy. You may have a sigmoidoscopy every 5 years or a colonoscopy every 10 years starting at age 74. Hepatitis C blood test. Hepatitis B blood test. Sexually transmitted disease (STD) testing. Diabetes screening. This is done by checking your blood sugar (glucose) after you have not eaten for a while (fasting). You may have this done every 1-3 years. Bone density scan. This is done to screen for osteoporosis. You may have this done starting at age 54. Mammogram. This may be done every 1-2 years. Talk to your health care provider about how often you should have regular mammograms. Talk with your health care provider about your test results, treatment options, and if necessary, the need for more tests. Vaccines  Your health care provider may recommend certain vaccines, such as: Influenza vaccine. This is recommended every year. Tetanus, diphtheria, and acellular pertussis (Tdap, Td) vaccine.  You may need a Td booster every 10  years. Zoster vaccine. You may need this after age 64. Pneumococcal 13-valent conjugate (PCV13) vaccine. One dose is recommended after age 79. Pneumococcal polysaccharide (PPSV23) vaccine. One dose is recommended after age 30. Talk to your health care provider about which screenings and vaccines you need and how often you need them. This information is not intended to replace advice given to you by your health care provider. Make sure you discuss any questions you have with your health care provider. Document Released: 04/10/2015 Document Revised: 12/02/2015 Document Reviewed: 01/13/2015 Elsevier Interactive Patient Education  2017 [place] Prevention in the Home Falls can cause injuries. They can happen to people of all ages. There are many things you can do to make your home safe and to help prevent falls. What can I do on the outside of my home? Regularly fix the edges of walkways and driveways and fix any cracks. Remove anything that might make you trip as you walk through a door, such as a raised step or threshold. Trim any bushes or trees on the path to your home. Use bright outdoor lighting. Clear any walking paths of anything that might make someone trip, such as rocks or tools. Regularly check to see if handrails are loose or broken. Make sure that both sides of any steps have handrails. Any raised decks and porches should have guardrails on the edges. Have any leaves, snow, or ice cleared regularly. Use sand or salt on walking paths during winter. Clean up any spills in your garage right away. This includes oil or grease spills. What can I do in the bathroom? Use night lights. Install grab bars by the toilet and in the tub and shower. Do not use towel bars as grab bars. Use non-skid mats or decals in the tub or shower. If you need to sit down in the shower, use a plastic, non-slip stool. Keep the floor dry. Clean up any water that spills on the floor as soon as it  happens. Remove soap buildup in the tub or shower regularly. Attach bath mats securely with double-sided non-slip rug tape. Do not have throw rugs and other things on the floor that can make you trip. What can I do in the bedroom? Use night lights. Make sure that you have a light by your bed that is easy to reach. Do not use any sheets or blankets that are too big for your bed. They should not hang down onto the floor. Have a firm chair that has side arms. You can use this for support while you get dressed. Do not have throw rugs and other things on the floor that can make you trip. What can I do in the kitchen? Clean up any spills right away. Avoid walking on wet floors. Keep items that you use a lot in easy-to-reach places. If you need to reach something above you, use a strong step stool that has a grab bar. Keep electrical cords out of the way. Do not use floor polish or wax that makes floors slippery. If you must use wax, use non-skid floor wax. Do not have throw rugs and other things on the floor that can make you trip. What can I do with my stairs? Do not leave any items on the stairs. Make sure that there are handrails on both sides of the stairs and use them. Fix handrails that are broken or loose. Make sure that handrails are as long as  the stairways. Check any carpeting to make sure that it is firmly attached to the stairs. Fix any carpet that is loose or worn. Avoid having throw rugs at the top or bottom of the stairs. If you do have throw rugs, attach them to the floor with carpet tape. Make sure that you have a light switch at the top of the stairs and the bottom of the stairs. If you do not have them, ask someone to add them for you. What else can I do to help prevent falls? Wear shoes that: Do not have high heels. Have rubber bottoms. Are comfortable and fit you well. Are closed at the toe. Do not wear sandals. If you use a stepladder: Make sure that it is fully opened.  Do not climb a closed stepladder. Make sure that both sides of the stepladder are locked into place. Ask someone to hold it for you, if possible. Clearly mark and make sure that you can see: Any grab bars or handrails. First and last steps. Where the edge of each step is. Use tools that help you move around (mobility aids) if they are needed. These include: Canes. Walkers. Scooters. Crutches. Turn on the lights when you go into a dark area. Replace any light bulbs as soon as they burn out. Set up your furniture so you have a clear path. Avoid moving your furniture around. If any of your floors are uneven, fix them. If there are any pets around you, be aware of where they are. Review your medicines with your doctor. Some medicines can make you feel dizzy. This can increase your chance of falling. Ask your doctor what other things that you can do to help prevent falls. This information is not intended to replace advice given to you by your health care provider. Make sure you discuss any questions you have with your health care provider. Document Released: 01/08/2009 Document Revised: 08/20/2015 Document Reviewed: 04/18/2014 Elsevier Interactive Patient Education  2017 [employer].

## 2021-01-14 NOTE — Progress Notes (Signed)
This visit occurred during the SARS-CoV-2 public health emergency.  Safety protocols were in place, including screening questions prior to the visit, additional usage of staff PPE, and extensive cleaning of exam room while observing appropriate contact time as indicated for disinfecting solutions.  Subjective:   Tina Ware is a 80 y.o. female who presents for Medicare Annual (Subsequent) preventive examination.  Review of Systems     Cardiac Risk Factors include: advanced age (>54men, >58 women);hypertension;obesity (BMI >30kg/m2);sedentary lifestyle     Objective:    Today's Vitals   01/14/21 1127  BP: 126/78  Pulse: 78  Temp: 98.3 F (36.8 C)  TempSrc: Oral  SpO2: 97%  Weight: 174 lb 3.2 oz (79 kg)  Height: 5' 2.4" (1.585 m)   Body mass index is 31.45 kg/m.  Advanced Directives 01/14/2021 09/22/2020 01/01/2020 06/04/2019 01/10/2019 12/25/2018 03/14/2018  Does Patient Have a Medical Advance Directive? No No No No No No No  Would patient like information on creating a medical advance directive? Yes (MAU/Ambulatory/Procedural Areas - Information given) - Yes (MAU/Ambulatory/Procedural Areas - Information given) No - Patient declined No - Patient declined - Yes (MAU/Ambulatory/Procedural Areas - Information given)    Current Medications (verified) Outpatient Encounter Medications as of 01/14/2021  Medication Sig   aspirin EC 81 MG tablet Take 81 mg by mouth daily.   Cholecalciferol (VITAMIN D) 50 MCG (2000 UT) tablet Take 2,000 Units by mouth daily.    furosemide (LASIX) 20 MG tablet Take 1 tablet (20 mg total) by mouth daily.   hydrocortisone (ANUSOL-HC) 25 MG suppository Place 1 suppository (25 mg total) rectally 2 (two) times daily. (Patient taking differently: Place 25 mg rectally as needed.)   labetalol (NORMODYNE) 100 MG tablet TAKE 1 TABLET BY MOUTH TWICE A DAY   mometasone (NASONEX) 50 MCG/ACT nasal spray USE 2 SPRAYS IN EACH NOSTRIL DAILY AS NEEDED FOR ALLERGIES    olmesartan-hydrochlorothiazide (BENICAR HCT) 40-25 MG tablet TAKE 1 TABLET BY MOUTH EVERY DAY IN THE MORNING   No facility-administered encounter medications on file as of 01/14/2021.    Allergies (verified) Patient has no known allergies.   History: Past Medical History:  Diagnosis Date   Arthritis    RA   CAD (coronary artery disease)    Fibroid, uterine    Hypertension    Past Surgical History:  Procedure Laterality Date   CARPAL TUNNEL RELEASE Right 12/20/2018   CATARACT EXTRACTION, BILATERAL     CHOLECYSTECTOMY     TRIGGER FINGER RELEASE Right 12/20/2018   Family History  Problem Relation Age of Onset   Heart disease Mother    Dementia Mother    Congestive Heart Failure Mother    Parkinson's disease Father    Pancreatic cancer Sister    Social History   Socioeconomic History   Marital status: Single    Spouse name: Not on file   Number of children: 1   Years of education: Not on file   Highest education level: Not on file  Occupational History   Not on file  Tobacco Use   Smoking status: Never   Smokeless tobacco: Never  Vaping Use   Vaping Use: Never used  Substance and Sexual Activity   Alcohol use: No    Alcohol/week: 0.0 standard drinks   Drug use: No   Sexual activity: Not Currently  Other Topics Concern   Not on file  Social History Narrative   Right handed   Lives in a one story home    Drinks  tea    Social Determinants of Radio broadcast assistant Strain: Low Risk    Difficulty of Paying Living Expenses: Not hard at all  Food Insecurity: No Food Insecurity   Worried About Charity fundraiser in the Last Year: Never true   Ran Out of Food in the Last Year: Never true  Transportation Needs: No Transportation Needs   Lack of Transportation (Medical): No   Lack of Transportation (Non-Medical): No  Physical Activity: Inactive   Days of Exercise per Week: 0 days   Minutes of Exercise per Session: 0 min  Stress: No Stress Concern Present    Feeling of Stress : Not at all  Social Connections: Not on file    Tobacco Counseling Counseling given: Not Answered   Clinical Intake:  Pre-visit preparation completed: Yes  Pain : No/denies pain     Nutritional Status: BMI > 30  Obese Nutritional Risks: None Diabetes: No  How often do you need to have someone help you when you read instructions, pamphlets, or other written materials from your doctor or pharmacy?: 1 - Never What is the last grade level you completed in school?: some post grad  Diabetic? no  Interpreter Needed?: No  Information entered by :: NAllen LPN   Activities of Daily Living In your present state of health, do you have any difficulty performing the following activities: 01/14/2021 01/06/2021  Hearing? N N  Vision? N N  Difficulty concentrating or making decisions? N N  Walking or climbing stairs? N N  Dressing or bathing? N N  Doing errands, shopping? N N  Preparing Food and eating ? N -  Using the Toilet? N -  In the past six months, have you accidently leaked urine? N -  Do you have problems with loss of bowel control? N -  Managing your Medications? N -  Managing your Finances? N -  Housekeeping or managing your Housekeeping? N -  Some recent data might be hidden    Patient Care Team: Glendale Chard, MD as PCP - General (Internal Medicine)  Indicate any recent Medical Services you may have received from other than [facility] providers in the past year (date may be approximate).     Assessment:   This is a routine wellness examination for [name].  Hearing/Vision screen Vision Screening - Comments:: Regular eye exams, Dr. Herschel [place]  Dietary issues and exercise activities discussed: Current Exercise Habits: The patient does not participate in regular exercise at present   Goals Addressed             This Visit's Progress    Patient Stated       01/14/2021, continue to lose weight       Depression Screen PHQ 2/9 Scores  01/14/2021 01/06/2021 01/01/2020 07/01/2019 06/12/2019 02/05/2019 01/23/2019  PHQ - 2 Score 0 0 0 0 0 0 0  PHQ- 9 Score - - - - - - -    Fall Risk Fall Risk  01/14/2021 09/22/2020 01/01/2020 07/01/2019 06/12/2019  Falls in the past year? 0 0 1 0 1  Comment - - fainted - -  Number falls in past yr: - 0 0 - 0  Injury with Fall? - 0 1 - 1  Comment - - cut forehead - -  Risk for fall due to : Medication side effect - Medication side effect - -  Follow up Falls evaluation completed;Education provided;Falls prevention discussed - Falls evaluation completed;Education provided;Falls prevention discussed - -    FALL  RISK PREVENTION PERTAINING TO THE HOME:  Any stairs in or around the home? Yes  If so, are there any without handrails? No  Home free of loose throw rugs in walkways, pet beds, electrical cords, etc? Yes  Adequate lighting in your home to reduce risk of falls? Yes   ASSISTIVE DEVICES UTILIZED TO PREVENT FALLS:  Life alert? No  Use of a cane, walker or w/c? No  Grab bars in the bathroom? Yes  Shower chair or bench in shower? No  Elevated toilet seat or a handicapped toilet? No   TIMED UP AND GO:  Was the test performed? No .  .   Gait steady and fast without use of assistive device  Cognitive Function:     6CIT Screen 01/14/2021 01/01/2020 12/25/2018 03/14/2018  What Year? 0 points 0 points 0 points 0 points  What month? 0 points 0 points 0 points 0 points  What time? 0 points 0 points 0 points 0 points  Count back from 20 0 points 0 points 0 points 0 points  Months in reverse 0 points 0 points 0 points 0 points  Repeat phrase 4 points 4 points 0 points 0 points  Total Score 4 4 0 0    Immunizations Immunization History  Administered Date(s) Administered   Fluad Quad(high Dose 65+) 01/01/2020, 01/06/2021   Influenza, High Dose Seasonal PF 01/15/2018, 01/09/2019   Moderna SARS-COV2 Booster Vaccination 01/24/2020, 08/25/2020   Moderna Sars-Covid-2 Vaccination 04/21/2019,  05/19/2019   Pneumococcal Conjugate-13 12/13/2018   Tdap 07/06/2017   Zoster Recombinat (Shingrix) 11/09/2018, 02/28/2019, 03/26/2019    TDAP status: Up to date  Flu Vaccine status: Up to date  Pneumococcal vaccine status: Declined,  Education has been provided regarding the importance of this vaccine but patient still declined. Advised may receive this vaccine at local pharmacy or Health Dept. Aware to provide a copy of the vaccination record if obtained from local pharmacy or Health Dept. Verbalized acceptance and understanding.   Covid-19 vaccine status: Completed vaccines  Qualifies for Shingles Vaccine? Yes   Zostavax completed No   Shingrix Completed?: No.    Education has been provided regarding the importance of this vaccine. Patient has been advised to call insurance company to determine out of pocket expense if they have not yet received this vaccine. Advised may also receive vaccine at local pharmacy or Health Dept. Verbalized acceptance and understanding.  Screening Tests Health Maintenance  Topic Date Due   Pneumonia Vaccine 68+ Years old (2 - PPSV23 if available, else PCV20) 12/13/2019   COVID-19 Vaccine (5 - Booster for Moderna series) 10/20/2020   MAMMOGRAM  04/02/2021   TETANUS/TDAP  07/07/2027   INFLUENZA VACCINE  Completed   DEXA SCAN  Completed   Hepatitis C Screening  Completed   Zoster Vaccines- Shingrix  Completed   HPV VACCINES  Aged Out    Health Maintenance  Health Maintenance Due  Topic Date Due   Pneumonia Vaccine 74+ Years old (2 - PPSV23 if available, else PCV20) 12/13/2019   COVID-19 Vaccine (5 - Booster for Moderna series) 10/20/2020    Colorectal cancer screening: No longer required.   Mammogram status: Completed 04/02/2020. Repeat every year  Bone Density status: Completed 03/26/2018.   Lung Cancer Screening: (Low Dose CT Chest recommended if Age 78-80 years, 30 pack-year currently smoking OR have quit w/in 15years.) does not qualify.    Lung Cancer Screening Referral: no  Additional Screening:  Hepatitis C Screening: does qualify; Completed 01/01/2020  Vision Screening:  Recommended annual ophthalmology exams for early detection of glaucoma and other disorders of the eye. Is the patient up to date with their annual eye exam?  Yes  Who is the provider or what is the name of the office in which the patient attends annual eye exams? Dr. Herschel Senegal If pt is not established with a provider, would they like to be referred to a provider to establish care? No .   Dental Screening: Recommended annual dental exams for proper oral hygiene  Community Resource Referral / Chronic Care Management: CRR required this visit?  No   CCM required this visit?  No      Plan:     I have personally reviewed and noted the following in the patient's chart:   Medical and social history Use of alcohol, tobacco or illicit drugs  Current medications and supplements including opioid prescriptions.  Functional ability and status Nutritional status Physical activity Advanced directives List of other physicians Hospitalizations, surgeries, and ER visits in previous 12 months Vitals Screenings to include cognitive, depression, and falls Referrals and appointments  In addition, I have reviewed and discussed with patient certain preventive protocols, quality metrics, and best practice recommendations. A written personalized care plan for preventive services as well as general preventive health recommendations were provided to patient.     Kellie Simmering, LPN   [id number]   Nurse Notes:

## 2021-01-16 DIAGNOSIS — Z6831 Body mass index (BMI) 31.0-31.9, adult: Secondary | ICD-10-CM | POA: Insufficient documentation

## 2021-01-16 DIAGNOSIS — H6123 Impacted cerumen, bilateral: Secondary | ICD-10-CM | POA: Insufficient documentation

## 2021-01-16 DIAGNOSIS — I110 Hypertensive heart disease with heart failure: Secondary | ICD-10-CM | POA: Insufficient documentation

## 2021-01-16 DIAGNOSIS — I5032 Chronic diastolic (congestive) heart failure: Secondary | ICD-10-CM | POA: Insufficient documentation

## 2021-01-16 DIAGNOSIS — E6609 Other obesity due to excess calories: Secondary | ICD-10-CM | POA: Insufficient documentation

## 2021-01-16 DIAGNOSIS — R7309 Other abnormal glucose: Secondary | ICD-10-CM | POA: Insufficient documentation

## 2021-01-25 ENCOUNTER — Encounter: Payer: Self-pay | Admitting: Pharmacist

## 2021-01-25 NOTE — Progress Notes (Signed)
CARE PLAN ENTRY  01/25/2021 Name: Tina Ware MRN: [id number] DOB: 1940-06-21  Tina Ware is enrolled in Remote Patient Monitoring/Principle Care Monitoring.  Date of Enrollment: 05/29/19 Supervising physician: Adrian Prows Indication: HTN  Remote Readings: Compliant and Avg BP: 139/83, HR:86  Next scheduled OV: 03/12/21  Pharmacist Clinical Goal(s):  Over the next 90 days, patient will demonstrate Improved medication adherence as evidenced by medication fill history Over the next 90 days, patient will demonstrate improved understanding of prescribed medications and rationale for usage as evidenced by patient teach back Over the next 90 days, patient will experience decrease in ED visits. ED visits in last 6 months = 0 Over the next 90 days, patient will not experience hospital admission. Hospital Admissions in last 6 months = 0  Interventions: Provider and Inter-disciplinary care team collaboration (see longitudinal plan of care) Comprehensive medication review performed. Discussed plans with patient for ongoing care management follow up and provided patient with direct contact information for care management team Collaboration with provider re: medication management  Patient Self Care Activities:  Self administers medications as prescribed Attends all scheduled provider appointments Performs ADL's independently Performs IADL's independently  No Known Allergies Outpatient Encounter Medications as of 01/25/2021  Medication Sig   aspirin EC 81 MG tablet Take 81 mg by mouth daily.   Cholecalciferol (VITAMIN D) 50 MCG (2000 UT) tablet Take 2,000 Units by mouth daily.    furosemide (LASIX) 20 MG tablet Take 1 tablet (20 mg total) by mouth daily.   hydrocortisone (ANUSOL-HC) 25 MG suppository Place 1 suppository (25 mg total) rectally 2 (two) times daily. (Patient taking differently: Place 25 mg rectally as needed.)   labetalol (NORMODYNE) 100 MG tablet TAKE 1 TABLET BY  MOUTH TWICE A DAY   mometasone (NASONEX) 50 MCG/ACT nasal spray USE 2 SPRAYS IN EACH NOSTRIL DAILY AS NEEDED FOR ALLERGIES   olmesartan-hydrochlorothiazide (BENICAR HCT) 40-25 MG tablet TAKE 1 TABLET BY MOUTH EVERY DAY IN THE MORNING   No facility-administered encounter medications on file as of 01/25/2021.    Hypertension   BP goal is:  <130/80  Office blood pressures are  BP Readings from Last 3 Encounters:  01/14/21 126/78  01/06/21 138/82  12/15/20 (!) 166/99    Patient is currently controlled on the following medications: lasix 20 mg, olmesartan-HCTZ 40/25 mg, labetalol 100 mg BID,  Patient checks BP at home daily  Patient home BP readings are ranging: 113-172/66-103  We discussed diet and exercise extensively  Plan  Continue current medications and control with diet and exercise   ______________ Visit Information SDOH (Social Determinants of Health) assessments performed: Yes.  Tina Ware was given information about Principle Care Management/Remote Patient Monitoring services today including:  RPM/PCM service includes personalized support from designated clinical staff supervised by her physician, including individualized plan of care and coordination with other care providers 24/7 contact phone numbers for assistance for urgent and routine care needs. Standard insurance, coinsurance, copays and deductibles apply for principle care management only during months in which we provide at least 30 minutes of these services. Most insurances cover these services at 100%, however patients may be responsible for any copay, coinsurance and/or deductible if applicable. This service may help you avoid the need for more expensive face-to-face services. Only one practitioner may furnish and bill the service in a calendar month. The patient may stop PCM/RPM services at any time (effective at the end of the month) by phone call to the office staff.  Patient  agreed to services and  verbal consent obtained.   Manuela Schwartz, Pharm.D. [facility] [phone] Ext: 120

## 2021-01-30 DIAGNOSIS — I10 Essential (primary) hypertension: Secondary | ICD-10-CM | POA: Diagnosis not present

## 2021-02-08 ENCOUNTER — Other Ambulatory Visit (HOSPITAL_BASED_OUTPATIENT_CLINIC_OR_DEPARTMENT_OTHER): Payer: Self-pay

## 2021-02-08 ENCOUNTER — Other Ambulatory Visit: Payer: Self-pay

## 2021-02-08 ENCOUNTER — Ambulatory Visit: Payer: Medicare PPO | Attending: Internal Medicine

## 2021-02-08 DIAGNOSIS — Z23 Encounter for immunization: Secondary | ICD-10-CM

## 2021-02-08 MED ORDER — MODERNA COVID-19 BIVAL BOOSTER 50 MCG/0.5ML IM SUSP
INTRAMUSCULAR | 0 refills | Status: DC
  Filled 2021-02-08: qty 0.5, 1d supply, fill #0

## 2021-02-08 NOTE — Progress Notes (Signed)
   Covid-19 Vaccination Clinic  Name:  BRITTINEE RISK    MRN: [id number] DOB: 1940-07-12  02/08/2021  Ms. Elwell was observed post Covid-19 immunization for 15 minutes without incident. She was provided with Vaccine Information Sheet and instruction to access the V-Safe system.   Ms. Brauner was instructed to call 911 with any severe reactions post vaccine: Difficulty breathing  Swelling of face and throat  A fast heartbeat  A bad rash all over body  Dizziness and weakness   Immunizations Administered     Name Date Dose VIS Date Route   Moderna Covid-19 vaccine Bivalent Booster 02/08/2021 10:54 AM 0.5 mL 11/07/2020 Intramuscular   Manufacturer: Moderna   Lot: [id number]   [place]: [id number]

## 2021-02-16 ENCOUNTER — Other Ambulatory Visit: Payer: Self-pay

## 2021-02-16 ENCOUNTER — Ambulatory Visit: Payer: Medicare PPO

## 2021-02-16 VITALS — BP 142/88 | HR 70 | Temp 98.1°F | Ht 62.0 in | Wt 178.2 lb

## 2021-02-16 DIAGNOSIS — R7309 Other abnormal glucose: Secondary | ICD-10-CM

## 2021-02-16 LAB — POCT URINALYSIS DIPSTICK
Bilirubin, UA: NEGATIVE
Glucose, UA: NEGATIVE
Ketones, UA: NEGATIVE
Leukocytes, UA: NEGATIVE
Nitrite, UA: NEGATIVE
Protein, UA: NEGATIVE
Spec Grav, UA: 1.02 (ref 1.010–1.025)
Urobilinogen, UA: 1 E.U./dL
pH, UA: 7 (ref 5.0–8.0)

## 2021-02-16 LAB — POCT UA - MICROALBUMIN

## 2021-02-16 NOTE — Progress Notes (Signed)
Pt presents today for ear flushing & to repeat urine.

## 2021-03-01 DIAGNOSIS — I10 Essential (primary) hypertension: Secondary | ICD-10-CM | POA: Diagnosis not present

## 2021-03-12 ENCOUNTER — Other Ambulatory Visit: Payer: Self-pay

## 2021-03-12 ENCOUNTER — Ambulatory Visit: Payer: Medicare PPO | Admitting: Cardiology

## 2021-03-12 ENCOUNTER — Encounter: Payer: Self-pay | Admitting: Cardiology

## 2021-03-12 VITALS — BP 148/69 | HR 81 | Temp 98.4°F | Resp 16 | Ht 62.0 in | Wt 176.2 lb

## 2021-03-12 DIAGNOSIS — R0609 Other forms of dyspnea: Secondary | ICD-10-CM | POA: Diagnosis not present

## 2021-03-12 DIAGNOSIS — R600 Localized edema: Secondary | ICD-10-CM | POA: Diagnosis not present

## 2021-03-12 DIAGNOSIS — I10 Essential (primary) hypertension: Secondary | ICD-10-CM | POA: Diagnosis not present

## 2021-03-12 MED ORDER — SPIRONOLACTONE 25 MG PO TABS: 25.0000 mg | ORAL_TABLET | ORAL | 2 refills | Status: DC

## 2021-03-12 NOTE — Progress Notes (Signed)
Primary Physician/Referring:  Glendale Chard, MD  [name], female    DOB: Aug 12, 1940, 80 y.o.   MRN: [id number]  Chief Complaint  Patient presents with   Hypertension   Shortness of Breath   Edema   HPI:    Tina Ware  is a 80 y.o.  female with PMH significant for hypertension, prediabetes, hypercholesterolemia, rheumatoid arthritis, difficult to control hypertension and leg edema presents here for follow-up.    Overall she is feeling well and has noticed blood pressure is controlled and brings home monitoring.   Edema is stable, dyspnea on exertion is also stable.  No PND or orthopnea.  Past Medical History:  Diagnosis Date   Arthritis    RA   CAD (coronary artery disease)    Fibroid, uterine    Hypertension    Past Surgical History:  Procedure Laterality Date   CARPAL TUNNEL RELEASE Right 12/20/2018   CATARACT EXTRACTION, BILATERAL     CHOLECYSTECTOMY     TRIGGER FINGER RELEASE Right 12/20/2018   Family History  Problem Relation Age of Onset   Heart disease Mother    Dementia Mother    Congestive Heart Failure Mother    Parkinson's disease Father    Pancreatic cancer Sister     Social History   Tobacco Use   Smoking status: Never   Smokeless tobacco: Never  Substance Use Topics   Alcohol use: No    Alcohol/week: 0.0 standard drinks   Marital Status: Single ROS  Review of Systems  Cardiovascular:  Positive for dyspnea on exertion and leg swelling (right worse than left). Negative for chest pain.  Gastrointestinal:  Negative for melena.  Objective  Blood pressure (!) 148/69, pulse 81, temperature 98.4 F (36.9 C), temperature source Temporal, resp. rate 16, height 5\' 2"  (1.575 m), weight 176 lb 3.2 oz (79.9 kg), SpO2 93 %.  Vitals with BMI 03/12/2021 03/12/2021 02/16/2021  Height - 5\' 2"  5\' 2"   Weight - 176 lbs 3 oz 178 lbs 3 oz  BMI - 28.78 67.67  Systolic 209 470 962  Diastolic 69 86 88  Pulse 81 83 70     Physical  Exam Constitutional:      Comments: Well built and mildly obese and appears younger than stated age  Cardiovascular:     Rate and Rhythm: Normal rate and regular rhythm.     Pulses: Intact distal pulses.          Dorsalis pedis pulses are 2+ on the right side and 2+ on the left side.       Posterior tibial pulses are 1+ on the right side and 1+ on the left side.     Heart sounds: Normal heart sounds. No murmur heard.   No gallop.     Comments: 2+ below right knee leg edema, 1 + left and pitting.  large adipose tissue present. No JVD. Pulmonary:     Effort: Pulmonary effort is normal.     Breath sounds: Normal breath sounds.  Abdominal:     General: Bowel sounds are normal.     Palpations: Abdomen is soft.     Comments: obese   Laboratory examination:   Recent Labs    01/06/21 1206  NA 137  K 4.0  CL 97  CO2 27  GLUCOSE 101*  BUN 11  CREATININE 0.75  CALCIUM 9.6   CrCl cannot be calculated (Patient's most recent lab result is older than the maximum 21 days allowed.).  CMP Latest Ref Rng & Units 01/06/2021 01/01/2020 08/21/2019  Glucose 70 - 99 mg/dL 101(H) 94 80  BUN 8 - 27 mg/dL 11 14 8   Creatinine 0.57 - 1.00 mg/dL 0.75 0.89 0.77  Sodium 134 - 144 mmol/L 137 136 136  Potassium 3.5 - 5.2 mmol/L 4.0 4.2 4.0  Chloride 96 - 106 mmol/L 97 99 97  CO2 20 - 29 mmol/L 27 27 25   Calcium 8.7 - 10.3 mg/dL 9.6 9.3 9.4  Total Protein 6.0 - 8.5 g/dL 5.4(L) 5.5(L) -  Total Bilirubin 0.0 - 1.2 mg/dL 0.3 0.4 -  Alkaline Phos 44 - 121 IU/L 76 63 -  AST 0 - 40 IU/L 17 22 -  ALT 0 - 32 IU/L 12 15 -   CBC Latest Ref Rng & Units 01/06/2021 01/01/2020 06/07/2019  WBC 3.4 - 10.8 x10E3/uL 4.1 3.7 4.0  Hemoglobin 11.1 - 15.9 g/dL 14.7 13.6 13.1  Hematocrit 34.0 - 46.6 % 42.7 39.8 37.2  Platelets 150 - 450 x10E3/uL 200 202 177   Lipid Panel     Component Value Date/Time   CHOL 179 01/06/2021 1206   TRIG 69 01/06/2021 1206   HDL 72 01/06/2021 1206   CHOLHDL 2.5 01/06/2021 1206    LDLCALC 94 01/06/2021 1206  NHDL         116  HEMOGLOBIN A1C Lab Results  Component Value Date   HGBA1C 5.7 (H) 01/06/2021   TSH No results for input(s): TSH in the last 8760 hours.  BNP (last 3 results) No results for input(s): BNP in the last 8760 hours.  External Labs:  06/18/2019: H/H 13/37, PLT 226, MCV 86. Rest of CBC normal.  Ast 20, ALT 13.    Medications and allergies   Allergies  Allergen Reactions   Spironolactone Other (See Comments)    Hyponatremia     Current Outpatient Medications on File Prior to Visit  Medication Sig Dispense Refill   aspirin EC 81 MG tablet Take 81 mg by mouth daily.     Cholecalciferol (VITAMIN D) 50 MCG (2000 UT) tablet Take 2,000 Units by mouth daily.      furosemide (LASIX) 20 MG tablet Take 1 tablet (20 mg total) by mouth daily. (Patient taking differently: Take 20 mg by mouth daily as needed for edema.) 90 tablet 1   hydrocortisone (ANUSOL-HC) 25 MG suppository Place 1 suppository (25 mg total) rectally 2 (two) times daily. (Patient taking differently: Place 25 mg rectally as needed.) 12 suppository 0   labetalol (NORMODYNE) 100 MG tablet TAKE 1 TABLET BY MOUTH TWICE A DAY 180 tablet 3   mometasone (NASONEX) 50 MCG/ACT nasal spray USE 2 SPRAYS IN EACH NOSTRIL DAILY AS NEEDED FOR ALLERGIES 51 each 1   olmesartan-hydrochlorothiazide (BENICAR HCT) 40-25 MG tablet TAKE 1 TABLET BY MOUTH EVERY DAY IN THE MORNING 90 tablet 1   No current facility-administered medications on file prior to visit.     Radiology:   Lower Extremity Venous Duplex  10/16/2019: 1. Negative for acute or chronic lower extremity DVT. 2. Negative for bilateral lower extremity saphenous venous valvular incompetence, reflux, or significant varicose vein disease.  Cardiac Studies:   Treadmill Stress 12/16/2013: Indications: Shortness of breath. Conclusions: Negative for ischemia.  The patient exercised according to the [facility]   protocol, Total time recorded    4     Min.    10    sec. achieving a max heart rate of 137  which was  92% of MPHR for age  and  4.9 METS of work. The baseline ECG showed NSR,low voltage. During exercise there was no ST-T changes of ischemia. Symptoms: Dyspnea. Achieved 91% MPHR. Arrhythmia: None.  Baseline NIBP was 132/82. Peak NIBP was 132/82 MaxSysp was: 174 MaxDiasp was: 90. Mild decrease in functional capacity. Continue primary prevention.  Echocardiogram 06/04/2019:  1. Left ventricular ejection fraction, by estimation, is 60 to 65%. The left ventricle has normal function. The left ventricle has no regional  wall motion abnormalities. Left ventricular diastolic parameters are consistent with Grade II diastolic  dysfunction (pseudonormalization).  2. Right ventricular systolic function is normal. The right ventricular size is normal. There is normal pulmonary artery systolic pressure.  3. Left atrial size was mildly dilated.  4. Anterior fat pad noted in the pericardium.  5. The aortic valve is normal in structure. Aortic valve regurgitation is not visualized. Mild aortic valve sclerosis is present, with no evidence of aortic valve stenosis.   EKG:  EKG 03/12/2021: Normal sinus rhythm at rate of 82 bpm, left axis deviation, left anterior fascicular block.  Poor R wave progression, probably normal variant.  Early R in V2 probably lead placement.  No significant change from 03/12/2020.  Assessment     ICD-10-CM   1. Primary hypertension  I10 EKG [id number]    Basic metabolic panel    DISCONTINUED: spironolactone (ALDACTONE) 25 MG tablet    CANCELED: Basic metabolic panel    2. Dyspnea on exertion  R06.09     3. Bilateral leg edema  R60.0 DISCONTINUED: spironolactone (ALDACTONE) 25 MG tablet       Meds ordered this encounter  Medications   DISCONTD: spironolactone (ALDACTONE) 25 MG tablet    Sig: Take 1 tablet (25 mg total) by mouth every morning.    Dispense:  30 tablet    Refill:  2    Medications Discontinued During  This Encounter  Medication Reason   COVID-19 mRNA bivalent vaccine, Moderna, (MODERNA COVID-19 BIVAL BOOSTER) 50 MCG/0.5ML injection    spironolactone (ALDACTONE) 25 MG tablet Side effect (s)    Recommendations:   Betrice W Ninh  is a 80 y.o. AA female with PMH significant for hypertension, prediabetes, hypercholesterolemia, rheumatoid arthritis, hypertension and chronic leg edema presents here for follow-up of hypertension, bilateral leg edema and dyspnea.    Presently doing well, no clinical evidence of heart failure.  Suspect of chronic leg edema could be still venous disease related, she has complete resolution of leg edema when she wakes up with recurrence during daytime. She has been compliant with wearing support stockings.  She is using furosemide on a as needed basis.   Blood pressure is now very well controlled at home and home monitoring.  I reviewed her external labs, sodium levels have normalized since discontinuing spironolactone.  Non-HDL cholesterol is at goal.  No changes in the medications were done today.  I will see her back in a year.  She has been enrolled in  Remote Patient Monitoring and Principal Care Management. I will see her back in 1 year.      Adrian Prows, MD, [facility] 03/12/2021, 9:34 AM Office: [phone] Pager: [phone]

## 2021-03-19 ENCOUNTER — Ambulatory Visit: Payer: Medicare PPO | Admitting: Rheumatology

## 2021-04-01 DIAGNOSIS — I10 Essential (primary) hypertension: Secondary | ICD-10-CM | POA: Diagnosis not present

## 2021-04-12 ENCOUNTER — Other Ambulatory Visit: Payer: Self-pay | Admitting: Internal Medicine

## 2021-04-20 DIAGNOSIS — Z1231 Encounter for screening mammogram for malignant neoplasm of breast: Secondary | ICD-10-CM | POA: Diagnosis not present

## 2021-04-20 LAB — HM MAMMOGRAPHY

## 2021-04-22 ENCOUNTER — Encounter: Payer: Self-pay | Admitting: Internal Medicine

## 2021-05-02 DIAGNOSIS — I10 Essential (primary) hypertension: Secondary | ICD-10-CM | POA: Diagnosis not present

## 2021-05-04 NOTE — Progress Notes (Signed)
Office Visit Note  Patient: Tina Ware             Date of Birth: 07-22-1940           MRN: [id number]             PCP: Glendale Chard, MD Referring: Glendale Chard, MD Visit Date: 05/18/2021 Occupation: '@GUAROCC' @  Subjective:  Pain in both hands  History of Present Illness: Tina Ware is a 81 y.o. female with a history of osteoarthritis.  She states she continues to have pain and stiffness in her bilateral hands.  She is having difficulty gripping objects.  She still have difficulty with her right middle finger where she had trigger finger release.  She continues to have some discomfort in her right knee joint.  She had a recent DEXA scan and the results are pending.  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  Activities of Daily Living:  Patient reports morning stiffness for 5 minutes.   Patient Reports nocturnal pain.  Difficulty dressing/grooming: Denies Difficulty climbing stairs: Denies Difficulty getting out of chair: Denies Difficulty using hands for taps, buttons, cutlery, and/or writing: Denies  Review of Systems  Constitutional:  Positive for fatigue.  HENT:  Positive for mouth dryness. Negative for mouth sores and nose dryness.   Eyes:  Negative for pain, itching and dryness.  Respiratory:  Negative for shortness of breath and difficulty breathing.   Cardiovascular:  Negative for chest pain and palpitations.  Gastrointestinal:  Positive for constipation. Negative for blood in stool and diarrhea.  Endocrine: Negative for increased urination.  Genitourinary:  Negative for difficulty urinating.  Musculoskeletal:  Positive for joint pain, joint pain, joint swelling and morning stiffness. Negative for myalgias, muscle tenderness and myalgias.  Skin:  Negative for color change, rash and redness.  Allergic/Immunologic: Negative for susceptible to infections.  Neurological:  Positive for numbness, parasthesias and weakness. Negative for dizziness, headaches and  memory loss.  Hematological:  Negative for bruising/bleeding tendency.  Psychiatric/Behavioral:  Negative for confusion.    PMFS History:  Patient Active Problem List   Diagnosis Date Noted   Hypertensive heart disease with chronic diastolic congestive heart failure ([place]) 01/16/2021   Bilateral impacted cerumen 01/16/2021   Other abnormal glucose 01/16/2021   Class 1 obesity due to excess calories without serious comorbidity with body mass index (BMI) of 31.0 to 31.9 in adult 01/16/2021   Chronic diastolic (congestive) heart failure ([place]) 01/06/2021   Syncope 06/04/2019   Scalp hematoma 06/04/2019   Blood in stool 06/04/2019   Status post trigger finger release 01/24/2019   Carpal tunnel syndrome, right upper limb 12/27/2018   Bilateral leg edema 02/12/2018   Essential hypertension 02/12/2018   Chronic pain of right knee 02/12/2018   Hyponatremia 05/03/2017   Abdominal pain 05/03/2017    Past Medical History:  Diagnosis Date   Arthritis    RA   CAD (coronary artery disease)    Fibroid, uterine    Hypertension     Family History  Problem Relation Age of Onset   Heart disease Mother    Dementia Mother    Congestive Heart Failure Mother    Parkinson's disease Father    Pancreatic cancer Sister    Past Surgical History:  Procedure Laterality Date   CARPAL TUNNEL RELEASE Right 12/20/2018   CATARACT EXTRACTION, BILATERAL     CHOLECYSTECTOMY     TRIGGER FINGER RELEASE Right 12/20/2018   Social History   Social History Narrative  Right handed   Lives in a one story home    Drinks tea    Immunization History  Administered Date(s) Administered   Fluad Quad(high Dose 65+) 01/01/2020, 01/06/2021   Influenza, High Dose Seasonal PF 01/15/2018, 01/09/2019   Moderna Covid-19 Vaccine Bivalent Booster 71yr & up 02/08/2021   Moderna SARS-COV2 Booster Vaccination 01/24/2020, 08/25/2020   Moderna Sars-Covid-2 Vaccination 04/21/2019, 05/19/2019, 02/08/2021   Pneumococcal  Conjugate-13 12/13/2018   Tdap 07/06/2017   Zoster Recombinat (Shingrix) 11/09/2018, 02/28/2019, 03/26/2019     Objective: Vital Signs: BP (!) 157/79 (BP Location: Left Arm, Patient Position: Sitting, Cuff Size: Normal)    Pulse 81    Ht '5\' 2"'  (1.575 m)    Wt 179 lb 6.4 oz (81.4 kg)    BMI 32.81 kg/m    Physical Exam Vitals and nursing note reviewed.  Constitutional:      Appearance: She is well-developed.  HENT:     Head: Normocephalic and atraumatic.  Eyes:     Conjunctiva/sclera: Conjunctivae normal.  Cardiovascular:     Rate and Rhythm: Normal rate and regular rhythm.     Heart sounds: Normal heart sounds.  Pulmonary:     Effort: Pulmonary effort is normal.     Breath sounds: Normal breath sounds.  Abdominal:     General: Bowel sounds are normal.     Palpations: Abdomen is soft.  Musculoskeletal:     Cervical back: Normal range of motion.  Lymphadenopathy:     Cervical: No cervical adenopathy.  Skin:    General: Skin is warm and dry.     Capillary Refill: Capillary refill takes less than 2 seconds.  Neurological:     Mental Status: She is alert and oriented to person, place, and time.  Psychiatric:        Behavior: Behavior normal.     Musculoskeletal Exam: C-spine was in good range of motion.  Shoulder joints and elbow joints in good range of motion.  She had bilateral PIP and DIP thickening and limited range of motion.  No synovitis was noted.  She had incomplete fist formation.  Hip joints in good range of motion.  Knee joints with good range of motion.  There was no tenderness over ankles or MTPs.  CDAI Exam: CDAI Score: -- Patient Global: --; Provider Global: -- Swollen: --; Tender: -- Joint Exam 05/18/2021   No joint exam has been documented for this visit   There is currently no information documented on the homunculus. Go to the Rheumatology activity and complete the homunculus joint exam.  Investigation: No additional findings.  Imaging: No results  found.  Recent Labs: Lab Results  Component Value Date   WBC 4.1 01/06/2021   HGB 14.7 01/06/2021   PLT 200 01/06/2021   NA 137 01/06/2021   K 4.0 01/06/2021   CL 97 01/06/2021   CO2 27 01/06/2021   GLUCOSE 101 (H) 01/06/2021   BUN 11 01/06/2021   CREATININE 0.75 01/06/2021   BILITOT 0.3 01/06/2021   ALKPHOS 76 01/06/2021   AST 17 01/06/2021   ALT 12 01/06/2021   PROT 5.4 (L) 01/06/2021   ALBUMIN 3.6 (L) 01/06/2021   CALCIUM 9.6 01/06/2021   GFRAA 72 01/01/2020    Speciality Comments: No specialty comments available.  Procedures:  No procedures performed Allergies: Spironolactone   Assessment / Plan:     Visit Diagnoses: Primary osteoarthritis of both hands-she has severe osteoarthritis in her bilateral hands with DIP and PIP thickening and incomplete fist formation.  She is having difficulty gripping objects.  She is very active and still teaches.  She has difficulty with the typing.  I will refer her to PT and OT.  Rheumatoid factor positive - Rheumatoid factor was 61.9 on 06/18/2018.  Anti-CCP was negative and ESR was within normal limits at that time.  She had no synovitis on examination.  Paresthesia of both hands - NCV with EMG on 10/25/2018 which revealed very severe right median nerve entrapment and severe left median nerve entrapment at the wrist.  She continues to have intermittent carpal tunnel syndrome symptoms after being on computer.  She had right carpal tunnel release in the past.  Trigger finger, right middle finger - Surgical release performed by Dr. Durward Fortes Sept 2020.  She is contracture in her right third finger but no active tenosynovitis.  S/P carpal tunnel release - Right-Performed by Dr. Durward Fortes Sept 2020.   Carpal tunnel syndrome, left upper limb - Severe evident on NCV with EMG performed on 10/25/18.  She had a left carpal tunnel cortisone injection performed on 12/20/2019   Primary osteoarthritis of right knee-she has off-and-on discomfort.  She  joint protection muscle strengthening was discussed.  Osteopenia of multiple sites - DEXA on 03/26/18: right femoral neck BMD 0.628 with T-score -2.3 with -9% change in BMD in left femoral neck.  She had repeat DEXA scan recently and the results are pending.  Essential hypertension-her blood pressure was mildly elevated.  She was advised to monitor blood pressure closely.  Orders: Orders Placed This Encounter  Procedures   Ambulatory referral to Physical Therapy   No orders of the defined types were placed in this encounter.    Follow-Up Instructions: Return in about 6 months (around 11/15/2021) for Osteoarthritis.   Bo Merino, MD  Note - This record has been created using Editor, commissioning.  Chart creation errors have been sought, but may not always  have been located. Such creation errors do not reflect on  the standard of medical care.

## 2021-05-05 DIAGNOSIS — M8589 Other specified disorders of bone density and structure, multiple sites: Secondary | ICD-10-CM | POA: Diagnosis not present

## 2021-05-05 DIAGNOSIS — Z780 Asymptomatic menopausal state: Secondary | ICD-10-CM | POA: Diagnosis not present

## 2021-05-05 LAB — HM DEXA SCAN

## 2021-05-06 ENCOUNTER — Encounter: Payer: Self-pay | Admitting: Internal Medicine

## 2021-05-18 ENCOUNTER — Other Ambulatory Visit: Payer: Self-pay

## 2021-05-18 ENCOUNTER — Ambulatory Visit: Payer: Medicare PPO | Admitting: Rheumatology

## 2021-05-18 ENCOUNTER — Encounter: Payer: Self-pay | Admitting: Rheumatology

## 2021-05-18 VITALS — BP 157/79 | HR 81 | Ht 62.0 in | Wt 179.4 lb

## 2021-05-18 DIAGNOSIS — R768 Other specified abnormal immunological findings in serum: Secondary | ICD-10-CM | POA: Diagnosis not present

## 2021-05-18 DIAGNOSIS — R202 Paresthesia of skin: Secondary | ICD-10-CM

## 2021-05-18 DIAGNOSIS — M1711 Unilateral primary osteoarthritis, right knee: Secondary | ICD-10-CM | POA: Diagnosis not present

## 2021-05-18 DIAGNOSIS — M65331 Trigger finger, right middle finger: Secondary | ICD-10-CM

## 2021-05-18 DIAGNOSIS — G5602 Carpal tunnel syndrome, left upper limb: Secondary | ICD-10-CM | POA: Diagnosis not present

## 2021-05-18 DIAGNOSIS — I10 Essential (primary) hypertension: Secondary | ICD-10-CM

## 2021-05-18 DIAGNOSIS — Z9889 Other specified postprocedural states: Secondary | ICD-10-CM | POA: Diagnosis not present

## 2021-05-18 DIAGNOSIS — M19042 Primary osteoarthritis, left hand: Secondary | ICD-10-CM

## 2021-05-18 DIAGNOSIS — M19041 Primary osteoarthritis, right hand: Secondary | ICD-10-CM | POA: Diagnosis not present

## 2021-05-18 DIAGNOSIS — M8589 Other specified disorders of bone density and structure, multiple sites: Secondary | ICD-10-CM | POA: Diagnosis not present

## 2021-05-18 NOTE — Patient Instructions (Signed)
Hand Exercises Hand exercises can be helpful for almost anyone. These exercises can strengthen the hands, improve flexibility and movement, and increase blood flow to the hands. These results can make work and daily tasks easier. Hand exercises can be especially helpful for people who have joint pain from arthritis or have nerve damage from overuse (carpal tunnel syndrome). These exercises can also help people who have injured a hand. Exercises Most of these hand exercises are gentle stretching and motion exercises. It is usually safe to do them often throughout the day. Warming up your hands before exercise may help to reduce stiffness. You can do this with gentle massage or by placing your hands in warm water for 10-15 minutes. It is normal to feel some stretching, pulling, tightness, or mild discomfort as you begin new exercises. This will gradually improve. Stop an exercise right away if you feel sudden, severe pain or your pain gets worse. Ask your health care provider which exercises are best for you. Knuckle bend or "claw" fist  Stand or sit with your arm, hand, and all five fingers pointed straight up. Make sure to keep your wrist straight during the exercise. Gently bend your fingers down toward your palm until the tips of your fingers are touching the top of your palm. Keep your big knuckle straight and just bend the small knuckles in your fingers. Hold this position for __________ seconds. Straighten (extend) your fingers back to the starting position. Repeat this exercise 5-10 times with each hand. Full finger fist  Stand or sit with your arm, hand, and all five fingers pointed straight up. Make sure to keep your wrist straight during the exercise. Gently bend your fingers into your palm until the tips of your fingers are touching the middle of your palm. Hold this position for __________ seconds. Extend your fingers back to the starting position, stretching every joint fully. Repeat  this exercise 5-10 times with each hand. Straight fist Stand or sit with your arm, hand, and all five fingers pointed straight up. Make sure to keep your wrist straight during the exercise. Gently bend your fingers at the big knuckle, where your fingers meet your hand, and the middle knuckle. Keep the knuckle at the tips of your fingers straight and try to touch the bottom of your palm. Hold this position for __________ seconds. Extend your fingers back to the starting position, stretching every joint fully. Repeat this exercise 5-10 times with each hand. Tabletop  Stand or sit with your arm, hand, and all five fingers pointed straight up. Make sure to keep your wrist straight during the exercise. Gently bend your fingers at the big knuckle, where your fingers meet your hand, as far down as you can while keeping the small knuckles in your fingers straight. Think of forming a tabletop with your fingers. Hold this position for __________ seconds. Extend your fingers back to the starting position, stretching every joint fully. Repeat this exercise 5-10 times with each hand. Finger spread  Place your hand flat on a table with your palm facing down. Make sure your wrist stays straight as you do this exercise. Spread your fingers and thumb apart from each other as far as you can until you feel a gentle stretch. Hold this position for __________ seconds. Bring your fingers and thumb tight together again. Hold this position for __________ seconds. Repeat this exercise 5-10 times with each hand. Making circles  Stand or sit with your arm, hand, and all five fingers pointed   straight up. Make sure to keep your wrist straight during the exercise. Make a circle by touching the tip of your thumb to the tip of your index finger. Hold for __________ seconds. Then open your hand wide. Repeat this motion with your thumb and each finger on your hand. Repeat this exercise 5-10 times with each hand. Thumb  motion  Sit with your forearm resting on a table and your wrist straight. Your thumb should be facing up toward the ceiling. Keep your fingers relaxed as you move your thumb. Lift your thumb up as high as you can toward the ceiling. Hold for __________ seconds. Bend your thumb across your palm as far as you can, reaching the tip of your thumb for the small finger (pinkie) side of your palm. Hold for __________ seconds. Repeat this exercise 5-10 times with each hand. Grip strengthening  Hold a stress ball or other soft ball in the middle of your hand. Slowly increase the pressure, squeezing the ball as much as you can without causing pain. Think of bringing the tips of your fingers into the middle of your palm. All of your finger joints should bend when doing this exercise. Hold your squeeze for __________ seconds, then relax. Repeat this exercise 5-10 times with each hand. Contact a health care provider if: Your hand pain or discomfort gets much worse when you do an exercise. Your hand pain or discomfort does not improve within 2 hours after you exercise. If you have any of these problems, stop doing these exercises right away. Do not do them again unless your health care provider says that you can. Get help right away if: You develop sudden, severe hand pain or swelling. If this happens, stop doing these exercises right away. Do not do them again unless your health care provider says that you can. This information is not intended to replace advice given to you by your health care provider. Make sure you discuss any questions you have with your health care provider. Document Revised: 07/02/2020 Document Reviewed: 07/02/2020 [employer]vier Patient Education  2022 [employer]  

## 2021-05-26 ENCOUNTER — Other Ambulatory Visit: Payer: Self-pay

## 2021-05-26 DIAGNOSIS — M19041 Primary osteoarthritis, right hand: Secondary | ICD-10-CM

## 2021-05-26 DIAGNOSIS — M19042 Primary osteoarthritis, left hand: Secondary | ICD-10-CM

## 2021-06-01 DIAGNOSIS — I10 Essential (primary) hypertension: Secondary | ICD-10-CM | POA: Diagnosis not present

## 2021-06-07 ENCOUNTER — Encounter: Payer: Self-pay | Admitting: Occupational Therapy

## 2021-06-09 ENCOUNTER — Ambulatory Visit: Payer: Medicare PPO | Attending: Rheumatology | Admitting: Occupational Therapy

## 2021-06-09 ENCOUNTER — Other Ambulatory Visit: Payer: Self-pay

## 2021-06-09 DIAGNOSIS — M25642 Stiffness of left hand, not elsewhere classified: Secondary | ICD-10-CM | POA: Insufficient documentation

## 2021-06-09 DIAGNOSIS — M25641 Stiffness of right hand, not elsewhere classified: Secondary | ICD-10-CM | POA: Diagnosis not present

## 2021-06-09 DIAGNOSIS — M19041 Primary osteoarthritis, right hand: Secondary | ICD-10-CM | POA: Insufficient documentation

## 2021-06-09 DIAGNOSIS — M79641 Pain in right hand: Secondary | ICD-10-CM | POA: Insufficient documentation

## 2021-06-09 DIAGNOSIS — M19042 Primary osteoarthritis, left hand: Secondary | ICD-10-CM | POA: Insufficient documentation

## 2021-06-09 DIAGNOSIS — R278 Other lack of coordination: Secondary | ICD-10-CM | POA: Insufficient documentation

## 2021-06-09 DIAGNOSIS — M79642 Pain in left hand: Secondary | ICD-10-CM | POA: Diagnosis not present

## 2021-06-09 NOTE — Therapy (Signed)
?[address] [address] [place] ?[place], [place], [address] ?Phone: [phone]   Fax:  [phone] ? ?Occupational Therapy Evaluation and Treatment ? ?Patient Details  ?Name: Tina Ware ?MRN: [id number] ?Date of Birth: 1940-10-02 ?Referring Provider (OT): Dr. Estanislado Pandy ? ? ?Encounter Date: 06/09/2021 ? ? OT End of Session - 06/09/21 0917   ? ? Visit Number 1   ? Number of Visits 1   ? Authorization Type [employer] MCR   ? OT Start Time 0800   ? OT Stop Time 0930   ? OT Time Calculation (min) 90 min   ? Activity Tolerance Patient tolerated treatment well   ? Behavior During Therapy [facility] for tasks assessed/performed   ? ?  ?  ? ?  ? ? ?Past Medical History:  ?Diagnosis Date  ? Arthritis   ? RA  ? CAD (coronary artery disease)   ? Fibroid, uterine   ? Hypertension   ? ? ?Past Surgical History:  ?Procedure Laterality Date  ? CARPAL TUNNEL RELEASE Right 12/20/2018  ? CATARACT EXTRACTION, BILATERAL    ? CHOLECYSTECTOMY    ? TRIGGER FINGER RELEASE Right 12/20/2018  ? ? ?There were no vitals filed for this visit. ? ? Subjective Assessment - 06/09/21 0803   ? ? Pertinent History OA both hands, bilateral CTS, Rt CTR Sept 2020, Rt long finger trigger release Sept 2020   ? Limitations none reported   ? Patient Stated Goals compare to last time I was here   ? Currently in Pain? Yes   ? Pain Score 7    ? Pain Location Hand   ? Pain Orientation Right;Left   ? Pain Descriptors / Indicators Numbness;Sharp   ? Pain Type Chronic pain   ? Pain Onset More than a month ago   ? Pain Frequency Intermittent   ? Aggravating Factors  first thing in the am, cold weather   ? Pain Relieving Factors topical rub, heat   ? ?  ?  ? ?  ? ? ? ? [facility] OT Assessment - 06/09/21 0001   ? ?  ? Assessment  ? Medical Diagnosis OA bilateral hands   severe CTS bilaterally (Rt CTR 11/2018)  ? Referring Provider (OT) Dr. Estanislado Pandy   ? Onset Date/Surgical Date 05/26/21   (referral date, however pt has had  symptoms for years and was last seen in clinic 09/01/20)  ? Hand Dominance Right   ? Prior Therapy OPOT - last seen 09/01/20   ?  ? Precautions  ? Precautions None   per pt report  ?  ? Restrictions  ? Weight Bearing Restrictions No   ?  ? Balance Screen  ? Has the patient fallen in the past 6 months No   ? Has the patient had a decrease in activity level because of a fear of falling?  No   ? Is the patient reluctant to leave their home because of a fear of falling?  No   ?  ? Home  Environment  ? Bathroom Shower/Tub Tub/Shower unit;Curtain   ? Home Equipment None   ? Additional Comments Pt lives in 1 story townhouse w/ 3 steps to enter   ? Lives With Son   ?  ? Prior Function  ? Level of Independence Independent   ? Vocation Part time employment;Retired   ? Vocation Requirements substitute teacher   ? Leisure travel   ?  ? ADL  ? ADL comments Mod I with all  BADLS - difficulty with smaller buttons and needs extra time   ?  ? IADL  ? Shopping Shops independently for [employer]   ? Light Housekeeping Performs light daily tasks such as dishwashing, bed making   and vacuums, sweeps, mops  ? Meal Prep Plans, prepares and serves adequate meals independently   ? Community Mobility Drives own vehicle   ? Medication Management Is responsible for taking medication in correct dosages at correct time   ?  ? Mobility  ? Mobility Status Independent   ?  ? Written Expression  ? Dominant Hand Right   ? Handwriting 100% legible   print, slightly decreased w/ cursive  ?  ? Vision - History  ? Baseline Vision Wears glasses only for reading   ? Visual History --   cataract surgery  ?  ? Observation/Other Assessments  ? Observations Rt long finger and small finger PIP joint stiffness and swelling > than other fingers. Pt has actually increased grip strength from last admission, but has lost some coordination bilateral hands   ?  ? Sensation  ? Additional Comments pt reports fairly constant numbness entire volar hands   ?  ?  Coordination  ? 9 Hole Peg Test Right;Left   ? Right 9 Hole Peg Test 36.13 sec   ? Left 9 Hole Peg Test 35.34 sec   ?  ? Edema  ? Edema moderate in finger joints Rt hand > Lt   ?  ? ROM / Strength  ? AROM / PROM / Strength AROM   ?  ? AROM  ? Overall AROM Comments BUE AROM WFL's except hands w/ pain in bilateral shoulders mostly with IR. Rt thumb opposition to 4th, Lt thumb opposition to 5th digit. Full composite flex Rt hand approx 85%, Lt hand approx 75%. Pt with hand extension WFL's except for PIP joints Rt long and small fingers   ?  ? Hand Function  ? Right Hand Grip (lbs) 51.1 lbs   ? Right Hand Lateral Pinch 15 lbs   ? Right Hand 3 Point Pinch 12 lbs   ? Left Hand Grip (lbs) 29.5 lbs   ? Left Hand Lateral Pinch 13 lbs   ? Left 3 point pinch 8 lbs   ? ?  ?  ? ?  ? ? ?Pt issued coordination HEP - see pt instructions for details. Pt also encouraged to continue putty HEP.  ? ?Reviewed task modifications and A/E recommendations. Pt tried writing w/ various style pens and did best with Pen Again. Pt issued handout on this.  ? ?Discussed paraffin use for home and pt used paraffin in clinic today as review for pain management and joint stiffness. Pt instructed on where to purchase for home use ? ? ? ? ? ? ? ? ? ? ? ? ? ? ? ? OT Education - 06/09/21 0847   ? ? Education Details coordination HEP, paraffin bath instructions, A/E for pen and review of some task modifications and A/E recommendations from last admission   ? Person(s) Educated Patient   ? Methods Explanation;Demonstration;Handout   ? Comprehension Verbalized understanding;Returned demonstration   ? ?  ?  ? ?  ? ? ? OT Short Term Goals - 06/09/21 0941   ? ?  ? OT SHORT TERM GOAL #1  ? Title STG's = LTG's   ? ?  ?  ? ?  ? ? ? ? OT Long Term Goals - 06/09/21 0940   ? ?  ?  OT LONG TERM GOAL #1  ? Title Independent with coordination HEP   ? Status Achieved   ?  ? OT LONG TERM GOAL #2  ? Title Pt will verbalize understanding of paraffin bath for home use   ?  Status Achieved   ?  ? OT LONG TERM GOAL #3  ? Title Pt will verbalize understanding with task modifications and A/E recommendations   ? Status Achieved   ? ?  ?  ? ?  ? ? ? ? ? ? ? ? Plan - 06/09/21 0918   ? ? Clinical Impression Statement Pt is a 81 y.o. female who presents to [facility] for 2nd admission for bilateral hand OA and CTS. Pt was last seen in June 2022. Pt was seen today for evaluation which demo increased bilateral grip strength from last admission, however decreased bilateral coordination. Pt was also issued today: coordination HEP, trained in paraffin for home use, A/E recommendations for pen, and reviewed task modifications and A/E recommendations from last admission. Pt also encouraged to continue putty HEP and all recommendations from last admission. Pt reports she still has all handouts from last admission. Pt was seen for one time visit today.   ? OT Occupational Profile and History Problem Focused Assessment - Including review of records relating to presenting problem   ? Body Structure / Function / Physical Skills Coordination;Pain;Strength   ? Rehab Potential Excellent   ? OT Frequency One time visit   ? OT Treatment/Interventions Paraffin;Patient/family education;Therapeutic activities   ? Plan One time visit - pt does not need further O.T. at this time   ? Consulted and Agree with Plan of Care Patient   ? ?  ?  ? ?  ? ? ?Patient will benefit from skilled therapeutic intervention in order to improve the following deficits and impairments:   ?Body Structure / Function / Physical Skills: Coordination, Pain, Strength ?  ?  ? ? ?Visit Diagnosis: ?Stiffness of right hand, not elsewhere classified ? ?Stiffness of left hand, not elsewhere classified ? ?Other lack of coordination ? ? ? ?Problem List ?Patient Active Problem List  ? Diagnosis Date Noted  ? Hypertensive heart disease with chronic diastolic congestive heart failure ([place]) 01/16/2021  ? Bilateral impacted cerumen 01/16/2021  ? Other abnormal  glucose 01/16/2021  ? Class 1 obesity due to excess calories without serious comorbidity with body mass index (BMI) of 31.0 to 31.9 in adult 01/16/2021  ? Chronic diastolic (congestive) heart failure ([place]) 01/06/2021

## 2021-06-09 NOTE — Patient Instructions (Addendum)
?  Coordination Activities ? ?Perform the following activities for 10 minutes 1-2 times per day with both hand(s). ? ?Rotate ball in fingertips - walk fingers towards thumb. ?Flip cards 1 at a time as fast as you can. ?Deal cards with your thumb (Hold deck in hand and push card off top with thumb). ?Rotate one card in hand (clockwise and counter-clockwise). ?Pick up coins one at a time until you get 5 in your hand, then move coins from palm to fingertips to stack one at a time. ? ?Paraffin bath:  ? ?Dip hand 5 times in paraffin wax and wrap immediately in plastic bag followed by towel. Keep on for approx 10 min. Then peel off ?Do NOT do more than twice a day.  ?Helpful to do in the mornings, especially on cold and/or rainy days ?

## 2021-06-18 ENCOUNTER — Other Ambulatory Visit: Payer: Self-pay | Admitting: Internal Medicine

## 2021-06-18 DIAGNOSIS — E871 Hypo-osmolality and hyponatremia: Secondary | ICD-10-CM

## 2021-06-28 ENCOUNTER — Other Ambulatory Visit: Payer: Self-pay | Admitting: Cardiology

## 2021-06-28 DIAGNOSIS — I10 Essential (primary) hypertension: Secondary | ICD-10-CM

## 2021-07-02 DIAGNOSIS — I10 Essential (primary) hypertension: Secondary | ICD-10-CM | POA: Diagnosis not present

## 2021-07-07 ENCOUNTER — Encounter: Payer: Medicare PPO | Admitting: Internal Medicine

## 2021-07-07 NOTE — Progress Notes (Deleted)
?[name],acting as a Education administrator for [name], MD.,have documented all relevant documentation on the behalf of [name], MD,as directed by  [name], MD while in the presence of [name], MD.  ?This visit occurred during the SARS-CoV-2 public health emergency.  Safety protocols were in place, including screening questions prior to the visit, additional usage of staff PPE, and extensive cleaning of exam room while observing appropriate contact time as indicated for disinfecting solutions. ? ?Subjective:  ?  ? [name] , female    DOB: Oct 11, 1940 , 81 y.o.   MRN: [id number] ? ? ?Chief Complaint  ?Patient presents with  ? Hypertension  ? ? ?HPI ? ?She presents today for HTN f/u. She reports compliance with meds. She denies headaches, chest pain and shortness of breath.  ? ?Hypertension ?This is a chronic problem. The current episode started more than 1 year ago. The problem has been gradually improving since onset. The problem is controlled. Pertinent negatives include no blurred vision, palpitations or shortness of breath. Risk factors for coronary artery disease include sedentary lifestyle, post-menopausal state and obesity. The current treatment provides moderate improvement. Compliance problems include exercise.    ? ?Past Medical History:  ?Diagnosis Date  ? Arthritis   ? RA  ? CAD (coronary artery disease)   ? Fibroid, uterine   ? Hypertension   ?  ? ?Family History  ?Problem Relation Age of Onset  ? Heart disease Mother   ? Dementia Mother   ? Congestive Heart Failure Mother   ? Parkinson's disease Father   ? Pancreatic cancer Sister   ? ? ? ?Current Outpatient Medications:  ?  aspirin EC 81 MG tablet, Take 81 mg by mouth daily., Disp: , Rfl:  ?  Cholecalciferol (VITAMIN D) 50 MCG (2000 UT) tablet, Take 2,000 Units by mouth daily. , Disp: , Rfl:  ?  furosemide (LASIX) 20 MG tablet, TAKE 1 TABLET BY MOUTH EVERY DAY, Disp: 90 tablet, Rfl: 1 ?  hydrocortisone  (ANUSOL-HC) 25 MG suppository, Place 1 suppository (25 mg total) rectally 2 (two) times daily. (Patient taking differently: Place 25 mg rectally as needed.), Disp: 12 suppository, Rfl: 0 ?  labetalol (NORMODYNE) 100 MG tablet, TAKE 1 TABLET BY MOUTH TWICE A DAY, Disp: 180 tablet, Rfl: 3 ?  mometasone (NASONEX) 50 MCG/ACT nasal spray, USE 2 SPRAYS IN EACH NOSTRIL DAILY AS NEEDED FOR ALLERGIES, Disp: 51 each, Rfl: 1 ?  olmesartan-hydrochlorothiazide (BENICAR HCT) 40-25 MG tablet, TAKE 1 TABLET BY MOUTH EVERY DAY IN THE MORNING, Disp: 90 tablet, Rfl: 1  ? ?Allergies  ?Allergen Reactions  ? Spironolactone Other (See Comments)  ?  Hyponatremia  ?  ? ?Review of Systems  ?Constitutional: Negative.   ?Eyes:  Negative for blurred vision.  ?Respiratory: Negative.  Negative for shortness of breath.   ?Cardiovascular: Negative.  Negative for palpitations.  ?Neurological: Negative.   ?Psychiatric/Behavioral: Negative.     ? ?There were no vitals filed for this visit. ?There is no height or weight on file to calculate BMI.  ? ?Objective:  ?Physical Exam  ? ?   ?Assessment And Plan:  ?   ?1. Hypertensive heart disease with chronic diastolic congestive heart failure ([place]) ? ?2. Immunization due ?  ? ? ?Patient was given opportunity to ask questions. Patient verbalized understanding of the plan and was able to repeat key elements of the plan. All questions were answered to their satisfaction.  ?Sheppard Evens Llittleton,  CMA  ? ?I, [place], CMA, have reviewed all documentation for this visit. The documentation on 07/07/21 for the exam, diagnosis, procedures, and orders are all accurate and complete.  ? ?IF YOU HAVE BEEN REFERRED TO A SPECIALIST, IT MAY TAKE 1-2 WEEKS TO SCHEDULE/PROCESS THE REFERRAL. IF YOU HAVE NOT HEARD FROM US/SPECIALIST IN TWO WEEKS, PLEASE GIVE [place] A CALL AT [phone].  ? ?THE PATIENT IS ENCOURAGED TO PRACTICE SOCIAL DISTANCING DUE TO THE COVID-19 PANDEMIC.   ?

## 2021-07-13 ENCOUNTER — Ambulatory Visit: Payer: Medicare PPO | Admitting: Internal Medicine

## 2021-07-17 NOTE — Progress Notes (Signed)
Appt rescheduled

## 2021-07-21 ENCOUNTER — Ambulatory Visit: Payer: Medicare PPO | Admitting: Internal Medicine

## 2021-07-21 ENCOUNTER — Encounter: Payer: Self-pay | Admitting: Internal Medicine

## 2021-07-21 VITALS — BP 142/80 | HR 72 | Temp 98.7°F | Ht 62.6 in | Wt 178.6 lb

## 2021-07-21 DIAGNOSIS — E6609 Other obesity due to excess calories: Secondary | ICD-10-CM

## 2021-07-21 DIAGNOSIS — Z6832 Body mass index (BMI) 32.0-32.9, adult: Secondary | ICD-10-CM

## 2021-07-21 DIAGNOSIS — I110 Hypertensive heart disease with heart failure: Secondary | ICD-10-CM

## 2021-07-21 DIAGNOSIS — I5032 Chronic diastolic (congestive) heart failure: Secondary | ICD-10-CM | POA: Diagnosis not present

## 2021-07-21 DIAGNOSIS — R7309 Other abnormal glucose: Secondary | ICD-10-CM

## 2021-07-21 DIAGNOSIS — Z23 Encounter for immunization: Secondary | ICD-10-CM

## 2021-07-21 LAB — CMP14+EGFR
ALT: 16 IU/L (ref 0–32)
AST: 25 IU/L (ref 0–40)
Albumin/Globulin Ratio: 1.6 (ref 1.2–2.2)
Albumin: 3.4 g/dL — ABNORMAL LOW (ref 3.7–4.7)
Alkaline Phosphatase: 63 IU/L (ref 44–121)
BUN/Creatinine Ratio: 10 — ABNORMAL LOW (ref 12–28)
BUN: 9 mg/dL (ref 8–27)
Bilirubin Total: 0.5 mg/dL (ref 0.0–1.2)
CO2: 27 mmol/L (ref 20–29)
Calcium: 9.3 mg/dL (ref 8.7–10.3)
Chloride: 101 mmol/L (ref 96–106)
Creatinine, Ser: 0.91 mg/dL (ref 0.57–1.00)
Globulin, Total: 2.1 g/dL (ref 1.5–4.5)
Glucose: 98 mg/dL (ref 70–99)
Potassium: 4.7 mmol/L (ref 3.5–5.2)
Sodium: 139 mmol/L (ref 134–144)
Total Protein: 5.5 g/dL — ABNORMAL LOW (ref 6.0–8.5)
eGFR: 64 mL/min/{1.73_m2} (ref >59)

## 2021-07-21 LAB — HEMOGLOBIN A1C
Est. average glucose Bld gHb Est-mCnc: 117 mg/dL
Hgb A1c MFr Bld: 5.7 % — ABNORMAL HIGH (ref 4.8–5.6)

## 2021-07-21 NOTE — Progress Notes (Signed)
?Tina Ware,acting as a Education administrator for Tina Greenland, MD.,have documented all relevant documentation on the behalf of Tina Greenland, MD,as directed by  Tina Greenland, MD while in the presence of Tina Greenland, MD.  ?This visit occurred during the SARS-CoV-2 public health emergency.  Safety protocols were in place, including screening questions prior to the visit, additional usage of staff PPE, and extensive cleaning of exam room while observing appropriate contact time as indicated for disinfecting solutions. ? ?Subjective:  ?  ? [name] , female    DOB: [birth year] , 81 y.o.   MRN: [id number] ? ? ?Chief Complaint  ?Patient presents with  ? Hypertension  ? ? ?HPI ? ?She presents today for HTN f/u. She reports compliance with meds. She denies headaches, chest pain and shortness of breath. She has no specific concerns or complaints at this time.  ? ?Hypertension ?This is a chronic problem. The current episode started more than 1 year ago. The problem has been gradually improving since onset. The problem is controlled. Pertinent negatives include no blurred vision, palpitations or shortness of breath. Risk factors for coronary artery disease include sedentary lifestyle, post-menopausal state and obesity. The current treatment provides moderate improvement. Compliance problems include exercise.    ? ?Past Medical History:  ?Diagnosis Date  ? Arthritis   ? RA  ? CAD (coronary artery disease)   ? Fibroid, uterine   ? Hypertension   ?  ? ?Family History  ?Problem Relation Age of Onset  ? Heart disease Mother   ? Dementia Mother   ? Congestive Heart Failure Mother   ? Parkinson's disease Father   ? Pancreatic cancer Sister   ? ? ? ?Current Outpatient Medications:  ?  aspirin EC 81 MG tablet, Take 81 mg by mouth daily., Disp: , Rfl:  ?  Cholecalciferol (VITAMIN D) 50 MCG (2000 UT) tablet, Take 2,000 Units by mouth daily. , Disp: , Rfl:  ?  furosemide (LASIX) 20 MG tablet, TAKE 1 TABLET BY  MOUTH EVERY DAY, Disp: 90 tablet, Rfl: 1 ?  hydrocortisone (ANUSOL-HC) 25 MG suppository, Place 1 suppository (25 mg total) rectally 2 (two) times daily. (Patient taking differently: Place 25 mg rectally as needed.), Disp: 12 suppository, Rfl: 0 ?  labetalol (NORMODYNE) 100 MG tablet, TAKE 1 TABLET BY MOUTH TWICE A DAY, Disp: 180 tablet, Rfl: 3 ?  mometasone (NASONEX) 50 MCG/ACT nasal spray, USE 2 SPRAYS IN EACH NOSTRIL DAILY AS NEEDED FOR ALLERGIES, Disp: 51 each, Rfl: 1 ?  olmesartan-hydrochlorothiazide (BENICAR HCT) 40-25 MG tablet, TAKE 1 TABLET BY MOUTH EVERY DAY IN THE MORNING, Disp: 90 tablet, Rfl: 1  ? ?Allergies  ?Allergen Reactions  ? Spironolactone Other (See Comments)  ?  Hyponatremia  ?  ? ?Review of Systems  ?Constitutional: Negative.   ?Eyes:  Negative for blurred vision.  ?Respiratory: Negative.  Negative for shortness of breath.   ?Cardiovascular: Negative.  Negative for palpitations.  ?Neurological: Negative.   ?Psychiatric/Behavioral: Negative.     ? ?Today's Vitals  ? 07/21/21 0904  ?BP: (!) 142/80  ?Pulse: 72  ?Temp: 98.7 ?F (37.1 ?C)  ?Weight: 178 lb 9.6 oz (81 kg)  ?Height: 5' 2.6" (1.59 m)  ?PainSc: 0-No pain  ? ?Body mass index is 32.04 kg/m?.  ?Wt Readings from Last 3 Encounters:  ?07/21/21 178 lb 9.6 oz (81 kg)  ?05/18/21 179 lb 6.4 oz (81.4 kg)  ?03/12/21 176 lb 3.2 oz (79.9 kg)  ?  ? ?  Objective:  ?Physical Exam ?Vitals and nursing note reviewed.  ?Constitutional:   ?   Appearance: Normal appearance.  ?HENT:  ?   Head: Normocephalic and atraumatic.  ?Eyes:  ?   Extraocular Movements: Extraocular movements intact.  ?Cardiovascular:  ?   Rate and Rhythm: Normal rate and regular rhythm.  ?   Heart sounds: Normal heart sounds.  ?Pulmonary:  ?   Effort: Pulmonary effort is normal.  ?   Breath sounds: Normal breath sounds.  ?Musculoskeletal:  ?   Cervical back: Normal range of motion.  ?   Right lower leg: Edema present.  ?   Left lower leg: Edema present.  ?Skin: ?   General: Skin is warm.   ?Neurological:  ?   General: No focal deficit present.  ?   Mental Status: She is alert.  ?Psychiatric:     ?   Mood and Affect: Mood normal.     ?   Behavior: Behavior normal.  ?  ? ?   ?Assessment And Plan:  ?   ?1. Hypertensive heart disease with chronic diastolic congestive heart failure ([place]) ?Comments: Chronic, uncontrolled. Pt currently taking furosemide prn. Advised to take at least once weekly. Importance of sodium restricted diet was stressed to her.  ?- CMP14+EGFR ? ?2. Other abnormal glucose ?Comments: Her a1c has been elevated in the past. I will recheck this today. She is encouraged to limit her intake of sweetened beverages, including diet drinks.  ?- CMP14+EGFR ?- Hemoglobin A1c ? ?3. Class 1 obesity due to excess calories with serious comorbidity and body mass index (BMI) of 32.0 to 32.9 in adult ?Comments: She is encouraged to aim for at least 150 minutes of exercise per week, while striving for BMI<30 to decrease cardiac risk.  ? ?4. Immunization due ?- Pneumococcal polysaccharide vaccine 23-valent greater than or equal to 2yo subcutaneous/IM ?  ? ? ?Patient was given opportunity to ask questions. Patient verbalized understanding of the plan and was able to repeat key elements of the plan. All questions were answered to their satisfaction.  ? ? ?I, Tina Greenland, MD, have reviewed all documentation for this visit. The documentation on 07/21/21 for the exam, diagnosis, procedures, and orders are all accurate and complete.  ? ?IF YOU HAVE BEEN REFERRED TO A SPECIALIST, IT MAY TAKE 1-2 WEEKS TO SCHEDULE/PROCESS THE REFERRAL. IF YOU HAVE NOT HEARD FROM US/SPECIALIST IN TWO WEEKS, PLEASE GIVE [place] A CALL AT [phone] X [phone].  ? ?THE PATIENT IS ENCOURAGED TO PRACTICE SOCIAL DISTANCING DUE TO THE COVID-19 PANDEMIC.   ?

## 2021-07-21 NOTE — Patient Instructions (Signed)
Hypertension, Adult ?Hypertension is another name for high blood pressure. High blood pressure forces your heart to work harder to pump blood. This can cause problems over time. ?There are two numbers in a blood pressure reading. There is a top number (systolic) over a bottom number (diastolic). It is best to have a blood pressure that is below 120/80. ?What are the causes? ?The cause of this condition is not known. Some other conditions can lead to high blood pressure. ?What increases the risk? ?Some lifestyle factors can make you more likely to develop high blood pressure: ?Smoking. ?Not getting enough exercise or physical activity. ?Being overweight. ?Having too much fat, sugar, calories, or salt (sodium) in your diet. ?Drinking too much alcohol. ?Other risk factors include: ?Having any of these conditions: ?Heart disease. ?Diabetes. ?High cholesterol. ?Kidney disease. ?Obstructive sleep apnea. ?Having a family history of high blood pressure and high cholesterol. ?Age. The risk increases with age. ?Stress. ?What are the signs or symptoms? ?High blood pressure may not cause symptoms. Very high blood pressure (hypertensive crisis) may cause: ?Headache. ?Fast or uneven heartbeats (palpitations). ?Shortness of breath. ?Nosebleed. ?Vomiting or feeling like you may vomit (nauseous). ?Changes in how you see. ?Very bad chest pain. ?Feeling dizzy. ?Seizures. ?How is this treated? ?This condition is treated by making healthy lifestyle changes, such as: ?Eating healthy foods. ?Exercising more. ?Drinking less alcohol. ?Your doctor may prescribe medicine if lifestyle changes do not help enough and if: ?Your top number is above 130. ?Your bottom number is above 80. ?Your personal target blood pressure may vary. ?Follow these instructions at home: ?Eating and drinking ? ?If told, follow the DASH eating plan. To follow this plan: ?Fill one half of your plate at each meal with fruits and vegetables. ?Fill one fourth of your plate  at each meal with whole grains. Whole grains include whole-wheat pasta, brown rice, and whole-grain bread. ?Eat or drink low-fat dairy products, such as skim milk or low-fat yogurt. ?Fill one fourth of your plate at each meal with low-fat (lean) proteins. Low-fat proteins include fish, chicken without skin, eggs, beans, and tofu. ?Avoid fatty meat, cured and processed meat, or chicken with skin. ?Avoid pre-made or processed food. ?Limit the amount of salt in your diet to less than 1,500 mg each day. ?Do not drink alcohol if: ?Your doctor tells you not to drink. ?You are pregnant, may be pregnant, or are planning to become pregnant. ?If you drink alcohol: ?Limit how much you have to: ?0-1 drink a day for women. ?0-2 drinks a day for men. ?Know how much alcohol is in your drink. In the [place] one drink equals one 12 oz bottle of beer (355 mL), one 5 oz glass of wine (148 mL), or one 1? oz glass of hard liquor (44 mL). ?Lifestyle ? ?Work with your doctor to stay at a healthy weight or to lose weight. Ask your doctor what the best weight is for you. ?Get at least 30 minutes of exercise that causes your heart to beat faster (aerobic exercise) most days of the week. This may include walking, swimming, or biking. ?Get at least 30 minutes of exercise that strengthens your muscles (resistance exercise) at least 3 days a week. This may include lifting weights or doing Pilates. ?Do not smoke or use any products that contain nicotine or tobacco. If you need help quitting, ask your doctor. ?Check your blood pressure at home as told by your doctor. ?Keep all follow-up visits. ?Medicines ?Take over-the-counter and prescription medicines   only as told by your doctor. Follow directions carefully. ?Do not skip doses of blood pressure medicine. The medicine does not work as well if you skip doses. Skipping doses also puts you at risk for problems. ?Ask your doctor about side effects or reactions to medicines that you should watch  for. ?Contact a doctor if: ?You think you are having a reaction to the medicine you are taking. ?You have headaches that keep coming back. ?You feel dizzy. ?You have swelling in your ankles. ?You have trouble with your vision. ?Get help right away if: ?You get a very bad headache. ?You start to feel mixed up (confused). ?You feel weak or numb. ?You feel faint. ?You have very bad pain in your: ?Chest. ?Belly (abdomen). ?You vomit more than once. ?You have trouble breathing. ?These symptoms may be an emergency. Get help right away. Call 911. ?Do not wait to see if the symptoms will go away. ?Do not drive yourself to the hospital. ?Summary ?Hypertension is another name for high blood pressure. ?High blood pressure forces your heart to work harder to pump blood. ?For most people, a normal blood pressure is less than 120/80. ?Making healthy choices can help lower blood pressure. If your blood pressure does not get lower with healthy choices, you may need to take medicine. ?This information is not intended to replace advice given to you by your health care provider. Make sure you discuss any questions you have with your health care provider. ?Document Revised: 12/31/2020 Document Reviewed: 12/31/2020 ?Elsevier Patient Education ? 2023 [employer] ? ?

## 2021-09-01 DIAGNOSIS — I10 Essential (primary) hypertension: Secondary | ICD-10-CM | POA: Diagnosis not present

## 2021-09-14 ENCOUNTER — Other Ambulatory Visit: Payer: Self-pay | Admitting: Cardiology

## 2021-09-14 DIAGNOSIS — I10 Essential (primary) hypertension: Secondary | ICD-10-CM

## 2021-10-01 DIAGNOSIS — I10 Essential (primary) hypertension: Secondary | ICD-10-CM | POA: Diagnosis not present

## 2021-11-01 DIAGNOSIS — I10 Essential (primary) hypertension: Secondary | ICD-10-CM | POA: Diagnosis not present

## 2021-11-01 NOTE — Progress Notes (Unsigned)
Office Visit Note  Patient: Tina Ware             Date of Birth: February 01, 1941           MRN: [id number]             PCP: Glendale Chard, MD Referring: Glendale Chard, MD Visit Date: 11/15/2021 Occupation: '@[email or web address]' @  Subjective:  Pain in both hands  History of Present Illness: Tina Ware is a 81 y.o. female with history of osteoarthritis and rheumatoid factor positive.  Patient presents today with increased pain, stiffness, and intermittent inflammation in both hands and both wrist joints.  She states that her symptoms have progressively been worsening over the past 6 months.  She has tried using arthritis compression gloves for symptomatic relief.  She tries to avoid the use of over-the-counter products for pain relief.  She denies any other joint pain or joint swelling at this time.  Activities of Daily Living:  Patient reports morning stiffness for 3 minutes.   Patient Denies nocturnal pain.  Difficulty dressing/grooming: Denies Difficulty climbing stairs: Denies Difficulty getting out of chair: Denies Difficulty using hands for taps, buttons, cutlery, and/or writing: Reports  Review of Systems  Constitutional:  Positive for fatigue.  HENT:  Positive for mouth dryness. Negative for mouth sores.   Eyes:  Positive for dryness.  Respiratory:  Negative for shortness of breath.   Cardiovascular:  Positive for swelling in legs/feet. Negative for chest pain and palpitations.  Gastrointestinal:  Positive for blood in stool and constipation. Negative for diarrhea.  Endocrine: Positive for increased urination.  Genitourinary:  Negative for involuntary urination.  Musculoskeletal:  Positive for joint pain, joint pain, joint swelling and morning stiffness. Negative for gait problem, myalgias, muscle weakness, muscle tenderness and myalgias.  Skin:  Negative for color change, rash, hair loss and sensitivity to sunlight.  Allergic/Immunologic: Negative for susceptible to  infections.  Neurological:  Positive for numbness. Negative for dizziness and headaches.  Hematological:  Negative for swollen glands.  Psychiatric/Behavioral:  Negative for depressed mood and sleep disturbance. The patient is not nervous/anxious.     PMFS History:  Patient Active Problem List   Diagnosis Date Noted   Hypertensive heart disease with chronic diastolic congestive heart failure ([place]) 01/16/2021   Bilateral impacted cerumen 01/16/2021   Other abnormal glucose 01/16/2021   Class 1 obesity due to excess calories without serious comorbidity with body mass index (BMI) of 31.0 to 31.9 in adult 01/16/2021   Chronic diastolic (congestive) heart failure ([place]) 01/06/2021   Syncope 06/04/2019   Scalp hematoma 06/04/2019   Blood in stool 06/04/2019   Status post trigger finger release 01/24/2019   Carpal tunnel syndrome, right upper limb 12/27/2018   Bilateral leg edema 02/12/2018   Essential hypertension 02/12/2018   Chronic pain of right knee 02/12/2018   Hyponatremia 05/03/2017   Abdominal pain 05/03/2017    Past Medical History:  Diagnosis Date   Arthritis    RA   CAD (coronary artery disease)    Fibroid, uterine    Hypertension     Family History  Problem Relation Age of Onset   Heart disease Mother    Dementia Mother    Congestive Heart Failure Mother    Parkinson's disease Father    Pancreatic cancer Sister    Past Surgical History:  Procedure Laterality Date   CARPAL TUNNEL RELEASE Right 12/20/2018   CATARACT EXTRACTION, BILATERAL     CHOLECYSTECTOMY     TRIGGER FINGER RELEASE  Right 12/20/2018   Social History   Social History Narrative   Right handed   Lives in a one story home    Drinks tea    Immunization History  Administered Date(s) Administered   Fluad Quad(high Dose 65+) 01/01/2020, 01/06/2021   Influenza, High Dose Seasonal PF 01/15/2018, 01/09/2019   Moderna Covid-19 Vaccine Bivalent Booster 21yr & up 02/08/2021   Moderna SARS-COV2  Booster Vaccination 01/24/2020, 08/25/2020   Moderna Sars-Covid-2 Vaccination 04/21/2019, 05/19/2019, 02/08/2021   Pneumococcal Conjugate-13 12/13/2018   Pneumococcal Polysaccharide-23 07/21/2021   Tdap 07/06/2017   Zoster Recombinat (Shingrix) 11/09/2018, 02/28/2019, 03/26/2019     Objective: Vital Signs: BP 128/75 (BP Location: Left Arm, Patient Position: Sitting, Cuff Size: Normal)   Pulse 84   Resp 15   Ht '5\' 3"'  (1.6 m)   Wt 180 lb (81.6 kg)   BMI 31.89 kg/m    Physical Exam Vitals and nursing note reviewed.  Constitutional:      Appearance: She is well-developed.  HENT:     Head: Normocephalic and atraumatic.  Eyes:     Conjunctiva/sclera: Conjunctivae normal.  Cardiovascular:     Rate and Rhythm: Normal rate and regular rhythm.     Heart sounds: Normal heart sounds.  Pulmonary:     Effort: Pulmonary effort is normal.     Breath sounds: Normal breath sounds.  Abdominal:     General: Bowel sounds are normal.     Palpations: Abdomen is soft.  Musculoskeletal:     Cervical back: Normal range of motion.  Skin:    General: Skin is warm and dry.     Capillary Refill: Capillary refill takes less than 2 seconds.  Neurological:     Mental Status: She is alert and oriented to person, place, and time.  Psychiatric:        Behavior: Behavior normal.      Musculoskeletal Exam: C-spine, thoracic spine, lumbar spine have good range of motion.  No midline spinal tenderness.  Shoulder joints and elbow joints have good range of motion.  Tenderness over both wrist joints in all MCP joints.  No obvious synovitis was noted.  Complete fist formation bilaterally.  Hip joints have good range of motion with no groin pain.  Knee joints have good range of motion with no warmth or effusion.  Ankle joints have good range of motion with no tenderness or joint swelling.  CDAI Exam: CDAI Score: -- Patient Global: --; Provider Global: -- Swollen: 0 ; Tender: 12  Joint Exam 11/15/2021       Right  Left  Wrist   Tender   Tender  MCP 1   Tender   Tender  MCP 2   Tender   Tender  MCP 3   Tender   Tender  MCP 4   Tender   Tender  MCP 5   Tender   Tender     Investigation: No additional findings.  Imaging: XR Hand 2 View Left  Result Date: 11/15/2021 CMC, PIP, DIP narrowing was noted.  No MCP, intercarpal or radiocarpal joint space narrowing was noted.  No erosive changes were noted. Impression: These findings are consistent with osteoarthritis of the hand.  XR Hand 2 View Right  Result Date: 11/15/2021 CMC, PIP, DIP narrowing was noted.  No MCP, intercarpal or radiocarpal joint space narrowing was noted.  No erosive changes were noted. Impression: These findings are consistent with osteoarthritis of the hand.   Recent Labs: Lab Results  Component Value Date  WBC 4.1 01/06/2021   HGB 14.7 01/06/2021   PLT 200 01/06/2021   NA 139 07/21/2021   K 4.7 07/21/2021   CL 101 07/21/2021   CO2 27 07/21/2021   GLUCOSE 98 07/21/2021   BUN 9 07/21/2021   CREATININE 0.91 07/21/2021   BILITOT 0.5 07/21/2021   ALKPHOS 63 07/21/2021   AST 25 07/21/2021   ALT 16 07/21/2021   PROT 5.5 (L) 07/21/2021   ALBUMIN 3.4 (L) 07/21/2021   CALCIUM 9.3 07/21/2021   GFRAA 72 01/01/2020    Speciality Comments: No specialty comments available.  Procedures:  No procedures performed Allergies: Spironolactone   Assessment / Plan:     Visit Diagnoses: Primary osteoarthritis of both hands: She has PIP and DIP thickening consistent with osteoarthritis of both hands.  No tenderness over CMC joints on examination today.  Complete fist formation noted bilaterally.  She has been experiencing increased pain and stiffness in both hands over the past 6 months which is progressively been worsening.  On examination she has tenderness over both wrist joints in all MCP joints but no obvious synovitis was noted.  X-rays of both hands were updated today for further evaluation along with the following lab  work.  She will also be scheduled for an ultrasound of both hands to assess for synovitis. She was given a list of natural anti-inflammatories to start taking.  Pain in both hands - She presents today with increased pain in both hands for the past 6 months.  Her symptoms have progressively been worsening.  She has been experiencing increased stiffness in the morning and pain throughout the day.  She has tried using arthritis compression gloves with minimal relief.  On examination she has tenderness over both wrist joints and all MCP joints but no obvious synovitis was noted.  X-rays of both hands were obtained today which were consistent with osteoarthritic changes.  The following lab work will be obtained today for further evaluation.  She will be scheduled for an ultrasound of both hands to assess for synovitis.  Plan: XR Hand 2 View Right, XR Hand 2 View Left  Rheumatoid factor positive - Rheumatoid factor was 61.9 on 06/18/2018.  Anti-CCP was negative and ESR was within normal limits at that time.  The following lab work will be updated today given the patient's history of a positive rheumatoid factor as well as increased pain and stiffness in both hands.  She will also be scheduled for an ultrasound of both hands to assess for synovitis.- Plan: 14-3-3 eta Protein, Rheumatoid factor, Cyclic citrul peptide antibody, IgG, Sedimentation rate, C-reactive protein  Paresthesia of both hands - NCV with EMG on 10/25/2018 which revealed very severe right median nerve entrapment and severe left median nerve entrapment at the wrist.    Trigger finger, right middle finger - Surgical release performed by Dr. Durward Fortes Sept 2020. Resolved.   S/P carpal tunnel release - Right-Performed by Dr. Durward Fortes Sept 2020. Doing well.  Asymptomatic at this time.   Carpal tunnel syndrome, left upper limb - Severe evident on NCV with EMG performed on 10/25/18.  She had a left carpal tunnel cortisone injection performed on  12/20/2019.  She has intermittent paresthesias but no muscle weakness.   Primary osteoarthritis of right knee: She has good ROM of the right knee joint with no discomfort.  No warmth or effusion of the right knee joint.    Other medical conditions are listed as follows:   Osteopenia of multiple sites - DEXA on  03/26/18: right femoral neck BMD 0.628 with T-score -2.3 with -9% change in BMD in left femoral neck.  DEXA updated on 05/05/2021: Right femoral neck BMD 0.614 with T score -2.1.  Essential hypertension: BP was 128/75 today in the office.   Other fatigue - Plan: CBC with Differential/Platelet, COMPLETE METABOLIC PANEL WITH GFR  Orders: Orders Placed This Encounter  Procedures   XR Hand 2 View Right   XR Hand 2 View Left   14-3-3 eta Protein   Rheumatoid factor   Cyclic citrul peptide antibody, IgG   Sedimentation rate   C-reactive protein   CBC with Differential/Platelet   COMPLETE METABOLIC PANEL WITH GFR   No orders of the defined types were placed in this encounter.     Follow-Up Instructions: Return in 6 months (on 05/18/2022) for Osteoarthritis.   Ofilia Neas, PA-C  Note - This record has been created using Dragon software.  Chart creation errors have been sought, but may not always  have been located. Such creation errors do not reflect on  the standard of medical care.

## 2021-11-15 ENCOUNTER — Ambulatory Visit (INDEPENDENT_AMBULATORY_CARE_PROVIDER_SITE_OTHER): Payer: Medicare PPO

## 2021-11-15 ENCOUNTER — Encounter: Payer: Self-pay | Admitting: Physician Assistant

## 2021-11-15 ENCOUNTER — Ambulatory Visit: Payer: Medicare PPO | Attending: Physician Assistant | Admitting: Physician Assistant

## 2021-11-15 VITALS — BP 128/75 | HR 84 | Resp 15 | Ht 63.0 in | Wt 180.0 lb

## 2021-11-15 DIAGNOSIS — M79642 Pain in left hand: Secondary | ICD-10-CM

## 2021-11-15 DIAGNOSIS — G5602 Carpal tunnel syndrome, left upper limb: Secondary | ICD-10-CM | POA: Diagnosis not present

## 2021-11-15 DIAGNOSIS — M19041 Primary osteoarthritis, right hand: Secondary | ICD-10-CM | POA: Diagnosis not present

## 2021-11-15 DIAGNOSIS — M79641 Pain in right hand: Secondary | ICD-10-CM | POA: Diagnosis not present

## 2021-11-15 DIAGNOSIS — Z9889 Other specified postprocedural states: Secondary | ICD-10-CM | POA: Diagnosis not present

## 2021-11-15 DIAGNOSIS — M8589 Other specified disorders of bone density and structure, multiple sites: Secondary | ICD-10-CM

## 2021-11-15 DIAGNOSIS — R202 Paresthesia of skin: Secondary | ICD-10-CM

## 2021-11-15 DIAGNOSIS — M1711 Unilateral primary osteoarthritis, right knee: Secondary | ICD-10-CM | POA: Diagnosis not present

## 2021-11-15 DIAGNOSIS — M65331 Trigger finger, right middle finger: Secondary | ICD-10-CM | POA: Diagnosis not present

## 2021-11-15 DIAGNOSIS — R768 Other specified abnormal immunological findings in serum: Secondary | ICD-10-CM | POA: Diagnosis not present

## 2021-11-15 DIAGNOSIS — R5383 Other fatigue: Secondary | ICD-10-CM

## 2021-11-15 DIAGNOSIS — I10 Essential (primary) hypertension: Secondary | ICD-10-CM | POA: Diagnosis not present

## 2021-11-15 DIAGNOSIS — M19042 Primary osteoarthritis, left hand: Secondary | ICD-10-CM

## 2021-11-17 ENCOUNTER — Telehealth: Payer: Self-pay | Admitting: *Deleted

## 2021-11-17 NOTE — Telephone Encounter (Signed)
Attempted to contact the patient and left message for patient to call the office. Patient needs an ultrasound appointment for bilateral hands. Will offer 11/24/2021 at 3:00 pm or 11/25/2021 at 3:20 pm, whichever is till available when patient calls back.

## 2021-11-17 NOTE — Telephone Encounter (Signed)
Patient returned call to the office and states neither of those appointment dates and times work for her. Patient advised we will have to continue to monitor the schedule and call her back with other options for dates and time.

## 2021-11-21 LAB — CBC WITH DIFFERENTIAL/PLATELET
Absolute Monocytes: 378 cells/uL (ref 200–950)
Basophils Absolute: 20 cells/uL (ref 0–200)
Basophils Relative: 0.5 %
Eosinophils Absolute: 449 cells/uL (ref 15–500)
Eosinophils Relative: 11.5 %
HCT: 43.8 % (ref 35.0–45.0)
Hemoglobin: 15.1 g/dL (ref 11.7–15.5)
Lymphs Abs: 1318 cells/uL (ref 850–3900)
MCH: 29.5 pg (ref 27.0–33.0)
MCHC: 34.5 g/dL (ref 32.0–36.0)
MCV: 85.7 fL (ref 80.0–100.0)
MPV: 10.2 fL (ref 7.5–12.5)
Monocytes Relative: 9.7 %
Neutro Abs: 1736 cells/uL (ref 1500–7800)
Neutrophils Relative %: 44.5 %
Platelets: 186 10*3/uL (ref 140–400)
RBC: 5.11 10*6/uL — ABNORMAL HIGH (ref 3.80–5.10)
RDW: 13.8 % (ref 11.0–15.0)
Total Lymphocyte: 33.8 %
WBC: 3.9 10*3/uL (ref 3.8–10.8)

## 2021-11-21 LAB — COMPLETE METABOLIC PANEL WITH GFR
AG Ratio: 1.8 (calc) (ref 1.0–2.5)
ALT: 13 U/L (ref 6–29)
AST: 20 U/L (ref 10–35)
Albumin: 3.4 g/dL — ABNORMAL LOW (ref 3.6–5.1)
Alkaline phosphatase (APISO): 55 U/L (ref 37–153)
BUN: 10 mg/dL (ref 7–25)
CO2: 29 mmol/L (ref 20–32)
Calcium: 9.6 mg/dL (ref 8.6–10.4)
Chloride: 102 mmol/L (ref 98–110)
Creat: 0.87 mg/dL (ref 0.60–0.95)
Globulin: 1.9 g/dL (calc) (ref 1.9–3.7)
Glucose, Bld: 96 mg/dL (ref 65–99)
Potassium: 4 mmol/L (ref 3.5–5.3)
Sodium: 137 mmol/L (ref 135–146)
Total Bilirubin: 0.4 mg/dL (ref 0.2–1.2)
Total Protein: 5.3 g/dL — ABNORMAL LOW (ref 6.1–8.1)
eGFR: 67 mL/min/{1.73_m2} (ref >=60)

## 2021-11-21 LAB — 14-3-3 ETA PROTEIN: 14-3-3 eta Protein: <0.2 ng/mL (ref <0.2)

## 2021-11-21 LAB — CYCLIC CITRUL PEPTIDE ANTIBODY, IGG: Cyclic Citrullin Peptide Ab: <16 UNITS

## 2021-11-21 LAB — C-REACTIVE PROTEIN: CRP: 1.3 mg/L (ref <8.0)

## 2021-11-21 LAB — SEDIMENTATION RATE: Sed Rate: 2 mm/h (ref 0–30)

## 2021-11-21 LAB — RHEUMATOID FACTOR: Rheumatoid fact SerPl-aCnc: 19 IU/mL — ABNORMAL HIGH (ref <14)

## 2021-11-22 NOTE — Progress Notes (Signed)
Rheumatoid factor is  positive at a lower titer, CBC is normal, CMP is normal ,anti-CCP negative, '14 3 3 '$ eta negative, sed rate normal ,C-reactive protein is normal.  No change in treatment advised.

## 2021-12-01 DIAGNOSIS — I10 Essential (primary) hypertension: Secondary | ICD-10-CM | POA: Diagnosis not present

## 2021-12-14 ENCOUNTER — Other Ambulatory Visit: Payer: Self-pay | Admitting: Internal Medicine

## 2021-12-14 DIAGNOSIS — E871 Hypo-osmolality and hyponatremia: Secondary | ICD-10-CM

## 2021-12-24 ENCOUNTER — Other Ambulatory Visit: Payer: Self-pay | Admitting: Cardiology

## 2021-12-24 DIAGNOSIS — I1A0 Resistant hypertension: Secondary | ICD-10-CM

## 2021-12-31 DIAGNOSIS — I10 Essential (primary) hypertension: Secondary | ICD-10-CM | POA: Diagnosis not present

## 2022-01-19 ENCOUNTER — Ambulatory Visit (INDEPENDENT_AMBULATORY_CARE_PROVIDER_SITE_OTHER): Payer: Medicare PPO

## 2022-01-19 VITALS — BP 140/70 | HR 78 | Temp 98.2°F | Ht 63.0 in | Wt 175.2 lb

## 2022-01-19 DIAGNOSIS — Z0000 Encounter for general adult medical examination without abnormal findings: Secondary | ICD-10-CM | POA: Diagnosis not present

## 2022-01-19 NOTE — Patient Instructions (Signed)
Tina Ware , Thank you for taking time to come for your Medicare Wellness Visit. I appreciate your ongoing commitment to your health goals. Please review the following plan we discussed and let me know if I can assist you in the future.   Screening recommendations/referrals: Colonoscopy: not required Mammogram: completed 04/20/2021, due 04/21/2022 Bone Density: completed 05/05/2021 Recommended yearly ophthalmology/optometry visit for glaucoma screening and checkup Recommended yearly dental visit for hygiene and checkup  Vaccinations: Influenza vaccine: completed 12/25/2021 Pneumococcal vaccine: completed 07/21/2021 Tdap vaccine:  completed 07/06/2017, due 07/07/2027 Shingles vaccine: completed   Covid-19: 12/25/2021, 02/08/2021, 08/25/2020, 01/24/2020, 05/19/2019, 04/21/2019  Advanced directives: Advance directive discussed with you today. Even though you declined this today please call our office should you change your mind and we can give you the proper paperwork for you to fill out.  Conditions/risks identified: none  Next appointment: Follow up in one year for your annual wellness visit    Preventive Care 65 Years and Older, Female Preventive care refers to lifestyle choices and visits with your health care provider that can promote health and wellness. What does preventive care include? A yearly physical exam. This is also called an annual well check. Dental exams once or twice a year. Routine eye exams. Ask your health care provider how often you should have your eyes checked. Personal lifestyle choices, including: Daily care of your teeth and gums. Regular physical activity. Eating a healthy diet. Avoiding tobacco and drug use. Limiting alcohol use. Practicing safe sex. Taking low-dose aspirin every day. Taking vitamin and mineral supplements as recommended by your health care provider. What happens during an annual well check? The services and screenings done by your health care  provider during your annual well check will depend on your age, overall health, lifestyle risk factors, and family history of disease. Counseling  Your health care provider may ask you questions about your: Alcohol use. Tobacco use. Drug use. Emotional well-being. Home and relationship well-being. Sexual activity. Eating habits. History of falls. Memory and ability to understand (cognition). Work and work Statistician. Reproductive health. Screening  You may have the following tests or measurements: Height, weight, and BMI. Blood pressure. Lipid and cholesterol levels. These may be checked every 5 years, or more frequently if you are over 53 years old. Skin check. Lung cancer screening. You may have this screening every year starting at age 47 if you have a 30-pack-year history of smoking and currently smoke or have quit within the past 15 years. Fecal occult blood test (FOBT) of the stool. You may have this test every year starting at age 14. Flexible sigmoidoscopy or colonoscopy. You may have a sigmoidoscopy every 5 years or a colonoscopy every 10 years starting at age 7. Hepatitis C blood test. Hepatitis B blood test. Sexually transmitted disease (STD) testing. Diabetes screening. This is done by checking your blood sugar (glucose) after you have not eaten for a while (fasting). You may have this done every 1-3 years. Bone density scan. This is done to screen for osteoporosis. You may have this done starting at age 34. Mammogram. This may be done every 1-2 years. Talk to your health care provider about how often you should have regular mammograms. Talk with your health care provider about your test results, treatment options, and if necessary, the need for more tests. Vaccines  Your health care provider may recommend certain vaccines, such as: Influenza vaccine. This is recommended every year. Tetanus, diphtheria, and acellular pertussis (Tdap, Td) vaccine. You may need  a Td  booster every 10 years. Zoster vaccine. You may need this after age 58. Pneumococcal 13-valent conjugate (PCV13) vaccine. One dose is recommended after age 62. Pneumococcal polysaccharide (PPSV23) vaccine. One dose is recommended after age 66. Talk to your health care provider about which screenings and vaccines you need and how often you need them. This information is not intended to replace advice given to you by your health care provider. Make sure you discuss any questions you have with your health care provider. Document Released: 04/10/2015 Document Revised: 12/02/2015 Document Reviewed: 01/13/2015 Elsevier Interactive Patient Education  2017 [place] Prevention in the Home Falls can cause injuries. They can happen to people of all ages. There are many things you can do to make your home safe and to help prevent falls. What can I do on the outside of my home? Regularly fix the edges of walkways and driveways and fix any cracks. Remove anything that might make you trip as you walk through a door, such as a raised step or threshold. Trim any bushes or trees on the path to your home. Use bright outdoor lighting. Clear any walking paths of anything that might make someone trip, such as rocks or tools. Regularly check to see if handrails are loose or broken. Make sure that both sides of any steps have handrails. Any raised decks and porches should have guardrails on the edges. Have any leaves, snow, or ice cleared regularly. Use sand or salt on walking paths during winter. Clean up any spills in your garage right away. This includes oil or grease spills. What can I do in the bathroom? Use night lights. Install grab bars by the toilet and in the tub and shower. Do not use towel bars as grab bars. Use non-skid mats or decals in the tub or shower. If you need to sit down in the shower, use a plastic, non-slip stool. Keep the floor dry. Clean up any water that spills on the floor  as soon as it happens. Remove soap buildup in the tub or shower regularly. Attach bath mats securely with double-sided non-slip rug tape. Do not have throw rugs and other things on the floor that can make you trip. What can I do in the bedroom? Use night lights. Make sure that you have a light by your bed that is easy to reach. Do not use any sheets or blankets that are too big for your bed. They should not hang down onto the floor. Have a firm chair that has side arms. You can use this for support while you get dressed. Do not have throw rugs and other things on the floor that can make you trip. What can I do in the kitchen? Clean up any spills right away. Avoid walking on wet floors. Keep items that you use a lot in easy-to-reach places. If you need to reach something above you, use a strong step stool that has a grab bar. Keep electrical cords out of the way. Do not use floor polish or wax that makes floors slippery. If you must use wax, use non-skid floor wax. Do not have throw rugs and other things on the floor that can make you trip. What can I do with my stairs? Do not leave any items on the stairs. Make sure that there are handrails on both sides of the stairs and use them. Fix handrails that are broken or loose. Make sure that handrails are as long as the stairways. Check  any carpeting to make sure that it is firmly attached to the stairs. Fix any carpet that is loose or worn. Avoid having throw rugs at the top or bottom of the stairs. If you do have throw rugs, attach them to the floor with carpet tape. Make sure that you have a light switch at the top of the stairs and the bottom of the stairs. If you do not have them, ask someone to add them for you. What else can I do to help prevent falls? Wear shoes that: Do not have high heels. Have rubber bottoms. Are comfortable and fit you well. Are closed at the toe. Do not wear sandals. If you use a stepladder: Make sure that it is  fully opened. Do not climb a closed stepladder. Make sure that both sides of the stepladder are locked into place. Ask someone to hold it for you, if possible. Clearly mark and make sure that you can see: Any grab bars or handrails. First and last steps. Where the edge of each step is. Use tools that help you move around (mobility aids) if they are needed. These include: Canes. Walkers. Scooters. Crutches. Turn on the lights when you go into a dark area. Replace any light bulbs as soon as they burn out. Set up your furniture so you have a clear path. Avoid moving your furniture around. If any of your floors are uneven, fix them. If there are any pets around you, be aware of where they are. Review your medicines with your doctor. Some medicines can make you feel dizzy. This can increase your chance of falling. Ask your doctor what other things that you can do to help prevent falls. This information is not intended to replace advice given to you by your health care provider. Make sure you discuss any questions you have with your health care provider. Document Released: 01/08/2009 Document Revised: 08/20/2015 Document Reviewed: 04/18/2014 Elsevier Interactive Patient Education  2017 [employer].

## 2022-01-19 NOTE — Progress Notes (Signed)
Subjective:   Tina Ware is a 81 y.o. female who presents for Medicare Annual (Subsequent) preventive examination.  Review of Systems     Cardiac Risk Factors include: advanced age (>43mn, >>56women);hypertension;obesity (BMI >30kg/m2)     Objective:    Today's Vitals   01/19/22 0817 01/19/22 0835  BP: (!) 160/80 (!) 140/70  Pulse: 78   Temp: 98.2 F (36.8 C)   TempSrc: Oral   SpO2: 99%   Weight: 175 lb 3.2 oz (79.5 kg)   Height: '5\' 3"'$  (1.6 m)    Body mass index is 31.04 kg/m.     01/19/2022    8:27 AM 01/14/2021   11:35 AM 09/22/2020    7:49 AM 01/01/2020    3:17 PM 06/04/2019    3:24 PM 01/10/2019    1:38 PM 12/25/2018    3:01 PM  Advanced Directives  Does Patient Have a Medical Advance Directive? No No No No No No No  Would patient like information on creating a medical advance directive? No - Patient declined Yes (MAU/Ambulatory/Procedural Areas - Information given)  Yes (MAU/Ambulatory/Procedural Areas - Information given) No - Patient declined No - Patient declined     Current Medications (verified) Outpatient Encounter Medications as of 01/19/2022  Medication Sig   aspirin EC 81 MG tablet Take 81 mg by mouth daily.   Cholecalciferol (VITAMIN D) 50 MCG (2000 UT) tablet Take 2,000 Units by mouth daily.    furosemide (LASIX) 20 MG tablet TAKE 1 TABLET BY MOUTH EVERY DAY   hydrocortisone (ANUSOL-HC) 25 MG suppository Place 1 suppository (25 mg total) rectally 2 (two) times daily. (Patient taking differently: Place 25 mg rectally as needed.)   labetalol (NORMODYNE) 100 MG tablet TAKE 1 TABLET BY MOUTH TWICE A DAY   mometasone (NASONEX) 50 MCG/ACT nasal spray USE 2 SPRAYS IN EACH NOSTRIL DAILY AS NEEDED FOR ALLERGIES   olmesartan-hydrochlorothiazide (BENICAR HCT) 40-25 MG tablet TAKE 1 TABLET BY MOUTH EVERY DAY IN THE MORNING   No facility-administered encounter medications on file as of 01/19/2022.    Allergies (verified) Spironolactone    History: Past Medical History:  Diagnosis Date   Arthritis    RA   CAD (coronary artery disease)    Fibroid, uterine    Hypertension    Past Surgical History:  Procedure Laterality Date   CARPAL TUNNEL RELEASE Right 12/20/2018   CATARACT EXTRACTION, BILATERAL     CHOLECYSTECTOMY     TRIGGER FINGER RELEASE Right 12/20/2018   Family History  Problem Relation Age of Onset   Heart disease Mother    Dementia Mother    Congestive Heart Failure Mother    Parkinson's disease Father    Pancreatic cancer Sister    Social History   Socioeconomic History   Marital status: Single    Spouse name: Not on file   Number of children: 1   Years of education: Not on file   Highest education level: Not on file  Occupational History   Not on file  Tobacco Use   Smoking status: Never    Passive exposure: Past   Smokeless tobacco: Never  Vaping Use   Vaping Use: Never used  Substance and Sexual Activity   Alcohol use: No    Alcohol/week: 0.0 standard drinks of alcohol   Drug use: No   Sexual activity: Not Currently  Other Topics Concern   Not on file  Social History Narrative   Right handed   Lives in a one story  home    Drinks tea    Social Determinants of Health   Financial Resource Strain: Low Risk  (01/19/2022)   Overall Financial Resource Strain (CARDIA)    Difficulty of Paying Living Expenses: Not hard at all  Food Insecurity: No Food Insecurity (01/19/2022)   Hunger Vital Sign    Worried About Running Out of Food in the Last Year: Never true    Ran Out of Food in the Last Year: Never true  Transportation Needs: No Transportation Needs (01/19/2022)   PRAPARE - Hydrologist (Medical): No    Lack of Transportation (Non-Medical): No  Physical Activity: Insufficiently Active (01/19/2022)   Exercise Vital Sign    Days of Exercise per Week: 2 days    Minutes of Exercise per Session: 10 min  Stress: No Stress Concern Present (01/19/2022)    [place]    Feeling of Stress : Not at all  Social Connections: Not on file    Tobacco Counseling Counseling given: Not Answered   Clinical Intake:  Pre-visit preparation completed: Yes  Pain : No/denies pain     Nutritional Status: BMI > 30  Obese Nutritional Risks: None Diabetes: No  How often do you need to have someone help you when you read instructions, pamphlets, or other written materials from your doctor or pharmacy?: 1 - Never What is the last grade level you completed in school?: college  Diabetic? no  Interpreter Needed?: No  Information entered by :: NAllen LPN   Activities of Daily Living    01/19/2022    8:27 AM 01/17/2022    2:22 PM  In your present state of health, do you have any difficulty performing the following activities:  Hearing? 0 0  Vision? 0 0  Difficulty concentrating or making decisions? 0 0  Walking or climbing stairs? 0 0  Dressing or bathing? 0 0  Doing errands, shopping? 0 0  Preparing Food and eating ? N N  Using the Toilet? N N  In the past six months, have you accidently leaked urine? N N  Do you have problems with loss of bowel control? N N  Managing your Medications? N N  Managing your Finances? N N  Housekeeping or managing your Housekeeping? N N    Patient Care Team: Glendale Chard, MD as PCP - General (Internal Medicine)  Indicate any recent Medical Services you may have received from other than [facility] providers in the past year (date may be approximate).     Assessment:   This is a routine wellness examination for [name].  Hearing/Vision screen Vision Screening - Comments:: Regular eye exams, Dr. Herschel [place]  Dietary issues and exercise activities discussed: Current Exercise Habits: Home exercise routine, Type of exercise: walking, Time (Minutes): 10, Frequency (Times/Week): 2, Weekly Exercise (Minutes/Week): 20   Goals Addressed              This Visit's Progress    Patient Stated       01/19/2022, wants to lose weight       Depression Screen    01/19/2022    8:27 AM 01/14/2021   11:37 AM 01/06/2021   11:07 AM 01/01/2020    3:19 PM 07/01/2019    3:07 PM 06/12/2019    4:07 PM 02/05/2019    4:31 PM  PHQ 2/9 Scores  PHQ - 2 Score 0 0 0 0 0 0 0    Fall Risk  01/19/2022    8:27 AM 01/17/2022    2:22 PM 01/14/2021   11:37 AM 09/22/2020    7:48 AM 01/01/2020    3:18 PM  Fall Risk   Falls in the past year? 0 0 0 0 1  Comment     fainted  Number falls in past yr: 0   0 0  Injury with Fall? 0   0 1  Comment     cut forehead  Risk for fall due to : Medication side effect  Medication side effect  Medication side effect  Follow up Falls prevention discussed;Education provided;Falls evaluation completed  Falls evaluation completed;Education provided;Falls prevention discussed  Falls evaluation completed;Education provided;Falls prevention discussed    FALL RISK PREVENTION PERTAINING TO THE HOME:  Any stairs in or around the home? Yes  If so, are there any without handrails? No  Home free of loose throw rugs in walkways, pet beds, electrical cords, etc? Yes  Adequate lighting in your home to reduce risk of falls? Yes   ASSISTIVE DEVICES UTILIZED TO PREVENT FALLS:  Life alert? No  Use of a cane, walker or w/c? No  Grab bars in the bathroom? Yes  Shower chair or bench in shower? No  Elevated toilet seat or a handicapped toilet? No   TIMED UP AND GO:  Was the test performed? Yes .  Length of time to ambulate 10 feet: 5 sec.   Gait steady and fast without use of assistive device  Cognitive Function:        01/19/2022    8:28 AM 01/14/2021   11:38 AM 01/01/2020    3:20 PM 12/25/2018    3:06 PM 03/14/2018    2:46 PM  6CIT Screen  What Year? 0 points 0 points 0 points 0 points 0 points  What month? 0 points 0 points 0 points 0 points 0 points  What time? 0 points 0 points 0 points 0 points 0 points  Count  back from 20 0 points 0 points 0 points 0 points 0 points  Months in reverse 0 points 0 points 0 points 0 points 0 points  Repeat phrase 2 points 4 points 4 points 0 points 0 points  Total Score 2 points 4 points 4 points 0 points 0 points    Immunizations Immunization History  Administered Date(s) Administered   Fluad Quad(high Dose 65+) 01/01/2020, 01/06/2021, 12/25/2021   Influenza, High Dose Seasonal PF 01/15/2018, 01/09/2019   Moderna Covid-19 Vaccine Bivalent Booster 55yr & up 02/08/2021   Moderna SARS-COV2 Booster Vaccination 01/24/2020, 08/25/2020   Moderna Sars-Covid-2 Vaccination 04/21/2019, 05/19/2019, 02/08/2021   Pneumococcal Conjugate-13 12/13/2018   Pneumococcal Polysaccharide-23 07/21/2021   Tdap 07/06/2017   Unspecified SARS-COV-2 Vaccination 12/25/2021   Zoster Recombinat (Shingrix) 11/09/2018, 02/28/2019, 03/26/2019    TDAP status: Up to date  Flu Vaccine status: Up to date  Pneumococcal vaccine status: Up to date  Covid-19 vaccine status: Completed vaccines  Qualifies for Shingles Vaccine? Yes   Zostavax completed Yes   Shingrix Completed?: Yes  Screening Tests Health Maintenance  Topic Date Due   Medicare Annual Wellness (AWV)  02/13/2022   MAMMOGRAM  04/20/2022   DEXA SCAN  05/06/2023   TETANUS/TDAP  07/07/2027   Pneumonia Vaccine 80 Years old  Completed   INFLUENZA VACCINE  Completed   COVID-19 Vaccine  Completed   Zoster Vaccines- Shingrix  Completed   HPV VACCINES  Aged Out    Health Maintenance  Health Maintenance Due  Topic Date Due  Medicare Annual Wellness (AWV)  02/13/2022    Colorectal cancer screening: No longer required.   Mammogram status: Completed 04/20/2021. Repeat every year  Bone Density status: Completed 05/05/2021.   Lung Cancer Screening: (Low Dose CT Chest recommended if Age 62-80 years, 30 pack-year currently smoking OR have quit w/in 15years.) does not qualify.   Lung Cancer Screening Referral: no  Additional  Screening:  Hepatitis C Screening: does not qualify;   Vision Screening: Recommended annual ophthalmology exams for early detection of glaucoma and other disorders of the eye. Is the patient up to date with their annual eye exam?  Yes  Who is the provider or what is the name of the office in which the patient attends annual eye exams? Dr. Herschel [place] If pt is not established with a provider, would they like to be referred to a provider to establish care? No .   Dental Screening: Recommended annual dental exams for proper oral hygiene  Community Resource Referral / Chronic Care Management: CRR required this visit?  No   CCM required this visit?  No      Plan:     I have personally reviewed and noted the following in the patient's chart:   Medical and social history Use of alcohol, tobacco or illicit drugs  Current medications and supplements including opioid prescriptions. Patient is not currently taking opioid prescriptions. Functional ability and status Nutritional status Physical activity Advanced directives List of other physicians Hospitalizations, surgeries, and ER visits in previous 12 months Vitals Screenings to include cognitive, depression, and falls Referrals and appointments  In addition, I have reviewed and discussed with patient certain preventive protocols, quality metrics, and best practice recommendations. A written personalized care plan for preventive services as well as general preventive health recommendations were provided to patient.     Kellie Simmering, LPN   [id number]   Nurse Notes: none

## 2022-01-20 ENCOUNTER — Encounter: Payer: Self-pay | Admitting: Internal Medicine

## 2022-01-20 ENCOUNTER — Ambulatory Visit (INDEPENDENT_AMBULATORY_CARE_PROVIDER_SITE_OTHER): Payer: Medicare PPO | Admitting: Internal Medicine

## 2022-01-20 VITALS — BP 132/82 | HR 65 | Temp 98.6°F | Ht 63.0 in | Wt 174.6 lb

## 2022-01-20 DIAGNOSIS — I110 Hypertensive heart disease with heart failure: Secondary | ICD-10-CM

## 2022-01-20 DIAGNOSIS — R7309 Other abnormal glucose: Secondary | ICD-10-CM

## 2022-01-20 DIAGNOSIS — I5032 Chronic diastolic (congestive) heart failure: Secondary | ICD-10-CM | POA: Diagnosis not present

## 2022-01-20 DIAGNOSIS — Z6830 Body mass index (BMI) 30.0-30.9, adult: Secondary | ICD-10-CM | POA: Diagnosis not present

## 2022-01-20 DIAGNOSIS — H6123 Impacted cerumen, bilateral: Secondary | ICD-10-CM | POA: Diagnosis not present

## 2022-01-20 DIAGNOSIS — Z0000 Encounter for general adult medical examination without abnormal findings: Secondary | ICD-10-CM

## 2022-01-20 DIAGNOSIS — E6609 Other obesity due to excess calories: Secondary | ICD-10-CM | POA: Diagnosis not present

## 2022-01-20 DIAGNOSIS — Z79899 Other long term (current) drug therapy: Secondary | ICD-10-CM | POA: Diagnosis not present

## 2022-01-20 LAB — POCT URINALYSIS DIPSTICK
Bilirubin, UA: NEGATIVE
Blood, UA: NEGATIVE
Glucose, UA: NEGATIVE
Ketones, UA: NEGATIVE
Leukocytes, UA: NEGATIVE
Nitrite, UA: NEGATIVE
Protein, UA: NEGATIVE
Spec Grav, UA: >=1.03 — AB (ref 1.010–1.025)
Urobilinogen, UA: 0.2 E.U./dL
pH, UA: 6.5 (ref 5.0–8.0)

## 2022-01-20 NOTE — Progress Notes (Signed)
Tina Ware,acting as a Education administrator for Tina Greenland, MD.,have documented all relevant documentation on the behalf of Tina Greenland, MD,as directed by  Tina Greenland, MD while in the presence of Tina Greenland, MD.   Subjective:     [name] , female    DOB: 07/22/1940 , 81 y.o.   MRN: [id number]   Chief Complaint  Patient presents with   Annual Exam   Hypertension    HPI  She is here today for a full physical examination.  She is no longer followed by GYN. She reports compliance with meds. Denies headaches, chest pain and shortness of breath.   Hypertension This is a chronic problem. The current episode started more than 1 year ago. The problem has been gradually improving since onset. The problem is controlled. Pertinent negatives include no blurred vision. Risk factors for coronary artery disease include obesity, post-menopausal state and sedentary lifestyle. Past treatments include angiotensin blockers and diuretics. The current treatment provides moderate improvement. Compliance problems include exercise.      Past Medical History:  Diagnosis Date   Arthritis    RA   CAD (coronary artery disease)    Fibroid, uterine    Hypertension      Family History  Problem Relation Age of Onset   Heart disease Mother    Dementia Mother    Congestive Heart Failure Mother    Parkinson's disease Father    Pancreatic cancer Sister      Current Outpatient Medications:    aspirin EC 81 MG tablet, Take 81 mg by mouth daily., Disp: , Rfl:    Cholecalciferol (VITAMIN D) 50 MCG (2000 UT) tablet, Take 2,000 Units by mouth daily. , Disp: , Rfl:    furosemide (LASIX) 20 MG tablet, TAKE 1 TABLET BY MOUTH EVERY DAY, Disp: 90 tablet, Rfl: 1   hydrocortisone (ANUSOL-HC) 25 MG suppository, Place 1 suppository (25 mg total) rectally 2 (two) times daily. (Patient taking differently: Place 25 mg rectally as needed.), Disp: 12 suppository, Rfl: 0   labetalol  (NORMODYNE) 100 MG tablet, TAKE 1 TABLET BY MOUTH TWICE A DAY, Disp: 180 tablet, Rfl: 3   mometasone (NASONEX) 50 MCG/ACT nasal spray, USE 2 SPRAYS IN EACH NOSTRIL DAILY AS NEEDED FOR ALLERGIES, Disp: 51 each, Rfl: 1   olmesartan-hydrochlorothiazide (BENICAR HCT) 40-25 MG tablet, TAKE 1 TABLET BY MOUTH EVERY DAY IN THE MORNING, Disp: 90 tablet, Rfl: 1   Allergies  Allergen Reactions   Spironolactone Other (See Comments)    Hyponatremia      The patient states she uses post menopausal status for birth control. Last LMP was No LMP recorded. Patient is postmenopausal.. Negative for Dysmenorrhea. Negative for: breast discharge, breast lump(s), breast pain and breast self exam. Associated symptoms include abnormal vaginal bleeding. Pertinent negatives include abnormal bleeding (hematology), anxiety, decreased libido, depression, difficulty falling sleep, dyspareunia, history of infertility, nocturia, sexual dysfunction, sleep disturbances, urinary incontinence, urinary urgency, vaginal discharge and vaginal itching. Diet regular.The patient states her exercise level is  intermittent, she walks 2 days per week.   . The patient's tobacco use is:  Social History   Tobacco Use  Smoking Status Never   Passive exposure: Past  Smokeless Tobacco Never  . She has been exposed to passive smoke. The patient's alcohol use is:  Social History   Substance and Sexual Activity  Alcohol Use No   Alcohol/week: 0.0 standard drinks of alcohol    Review of Systems  Constitutional: Negative.   HENT: Negative.    Eyes: Negative.  Negative for blurred vision.  Respiratory: Negative.    Cardiovascular: Negative.   Gastrointestinal: Negative.   Endocrine: Negative.   Genitourinary: Negative.   Musculoskeletal: Negative.   Skin: Negative.   Allergic/Immunologic: Negative.   Neurological: Negative.   Hematological: Negative.   Psychiatric/Behavioral: Negative.       Today's Vitals   01/20/22 0957  BP:  132/82  Pulse: 65  Temp: 98.6 F (37 C)  Weight: 174 lb 9.6 oz (79.2 kg)  Height: '5\' 3"'$  (1.6 m)  PainSc: 0-No pain   Body mass index is 30.93 kg/m.  Wt Readings from Last 3 Encounters:  01/20/22 174 lb 9.6 oz (79.2 kg)  01/19/22 175 lb 3.2 oz (79.5 kg)  11/15/21 180 lb (81.6 kg)     Objective:  Physical Exam Vitals and nursing note reviewed.  Constitutional:      Appearance: Normal appearance. She is obese.  HENT:     Head: Normocephalic and atraumatic.     Right Ear: Ear canal and external ear normal. There is impacted cerumen.     Left Ear: Ear canal and external ear normal. There is impacted cerumen.     Nose:     Comments: Masked     Mouth/Throat:     Comments: Masked  Eyes:     Extraocular Movements: Extraocular movements intact.     Conjunctiva/sclera: Conjunctivae normal.     Pupils: Pupils are equal, round, and reactive to light.  Cardiovascular:     Rate and Rhythm: Normal rate and regular rhythm.     Pulses: Normal pulses.     Heart sounds: Normal heart sounds.  Pulmonary:     Effort: Pulmonary effort is normal.     Breath sounds: Normal breath sounds.  Abdominal:     General: Bowel sounds are normal.     Palpations: Abdomen is soft.  Genitourinary:    Comments: deferred Musculoskeletal:        General: Normal range of motion.     Cervical back: Normal range of motion and neck supple.     Right lower leg: Edema present.     Left lower leg: Edema present.  Skin:    General: Skin is warm and dry.  Neurological:     General: No focal deficit present.     Mental Status: She is alert and oriented to person, place, and time.  Psychiatric:        Mood and Affect: Mood normal.        Behavior: Behavior normal.         Assessment And Plan:     1. Encounter for general adult medical examination w/o abnormal findings Comments: A full exam was performed. Importance of monthly self breast exams was discussed with the patient. PATIENT IS ADVISED TO GET  30-45 MINUTES REGULAR EXERCISE NO LESS THAN FOUR TO FIVE DAYS PER WEEK - BOTH WEIGHTBEARING EXERCISES AND AEROBIC ARE RECOMMENDED.  PATIENT IS ADVISED TO FOLLOW A HEALTHY DIET WITH AT LEAST SIX FRUITS/VEGGIES PER DAY, DECREASE INTAKE OF RED MEAT, AND TO INCREASE FISH INTAKE TO TWO DAYS PER WEEK.  MEATS/FISH SHOULD NOT BE FRIED, BAKED OR BROILED IS PREFERABLE.  IT IS ALSO IMPORTANT TO CUT BACK ON YOUR SUGAR INTAKE. PLEASE AVOID ANYTHING WITH ADDED SUGAR, CORN SYRUP OR OTHER SWEETENERS. IF YOU MUST USE A SWEETENER, YOU CAN TRY STEVIA. IT IS ALSO IMPORTANT TO AVOID ARTIFICIALLY SWEETENERS AND DIET BEVERAGES. LASTLY, I  SUGGEST WEARING SPF 50 SUNSCREEN ON EXPOSED PARTS AND ESPECIALLY WHEN IN THE DIRECT SUNLIGHT FOR AN EXTENDED PERIOD OF TIME.  PLEASE AVOID FAST FOOD RESTAURANTS AND INCREASE YOUR WATER INTAKE.  2. Hypertensive heart disease with chronic diastolic congestive heart failure (HCC) Comments: Chronic, fair control. Goal BP<130/80. EKG performed, NSR w/o acute changes.  She is encouraged to follow low sodium diet. F/u 4-6 months.  - POCT Urinalysis Dipstick (81002) - Microalbumin / Creatinine Urine Ratio - EKG 12-Lead - Lipid panel - Amb Referral To Provider Referral Exercise Program (P.R.E.P) - TSH  3. Bilateral impacted cerumen Comments: Declined ear lavage.   4. Other abnormal glucose Comments: Her a1c has been elevated in the past. I will recheck this today. She is encouraged to limit her intake of sugary beverages and foods.  - Hemoglobin A1c  5. Class 1 obesity due to excess calories with serious comorbidity and body mass index (BMI) of 30.0 to 30.9 in adult Comments: She is encouraged to aim for at least 150 minutes of exercise per week.  - Amb Referral To Provider Referral Exercise Program (P.R.E.P)  6. Drug therapy - Vitamin B12  Patient was given opportunity to ask questions. Patient verbalized understanding of the plan and was able to repeat key elements of the plan. All  questions were answered to their satisfaction.   I, Tina Greenland, MD, have reviewed all documentation for this visit. The documentation on 01/20/22 for the exam, diagnosis, procedures, and orders are all accurate and complete.   THE PATIENT IS ENCOURAGED TO PRACTICE SOCIAL DISTANCING DUE TO THE COVID-19 PANDEMIC.

## 2022-01-20 NOTE — Patient Instructions (Signed)

## 2022-01-21 ENCOUNTER — Telehealth: Payer: Self-pay

## 2022-01-21 NOTE — Telephone Encounter (Signed)
Vmt to pt reference PREP referral Requested call back do discuss interest in participating

## 2022-01-22 LAB — LIPID PANEL
Chol/HDL Ratio: 2.1 ratio (ref 0.0–4.4)
Cholesterol, Total: 183 mg/dL (ref 100–199)
HDL: 88 mg/dL (ref >39)
LDL Chol Calc (NIH): 85 mg/dL (ref 0–99)
Triglycerides: 53 mg/dL (ref 0–149)
VLDL Cholesterol Cal: 10 mg/dL (ref 5–40)

## 2022-01-22 LAB — MICROALBUMIN / CREATININE URINE RATIO
Creatinine, Urine: 112 mg/dL
Microalb/Creat Ratio: <3 mg/g creat (ref 0–29)
Microalbumin, Urine: <3 ug/mL

## 2022-01-22 LAB — HEMOGLOBIN A1C
Est. average glucose Bld gHb Est-mCnc: 120 mg/dL
Hgb A1c MFr Bld: 5.8 % — ABNORMAL HIGH (ref 4.8–5.6)

## 2022-01-22 LAB — VITAMIN B12: Vitamin B-12: 408 pg/mL (ref 232–1245)

## 2022-01-22 LAB — TSH: TSH: 1.79 u[IU]/mL (ref 0.450–4.500)

## 2022-01-31 DIAGNOSIS — I10 Essential (primary) hypertension: Secondary | ICD-10-CM | POA: Diagnosis not present

## 2022-04-02 DIAGNOSIS — I10 Essential (primary) hypertension: Secondary | ICD-10-CM | POA: Diagnosis not present

## 2022-04-26 ENCOUNTER — Telehealth: Payer: Self-pay

## 2022-04-26 DIAGNOSIS — Z1231 Encounter for screening mammogram for malignant neoplasm of breast: Secondary | ICD-10-CM | POA: Diagnosis not present

## 2022-04-26 LAB — HM MAMMOGRAPHY

## 2022-04-26 NOTE — Telephone Encounter (Signed)
She called me 1/29, left voicemail; returned her call today 1/30, left voicemail

## 2022-04-26 NOTE — Telephone Encounter (Signed)
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

## 2022-05-03 DIAGNOSIS — I10 Essential (primary) hypertension: Secondary | ICD-10-CM | POA: Diagnosis not present

## 2022-06-02 DIAGNOSIS — I10 Essential (primary) hypertension: Secondary | ICD-10-CM | POA: Diagnosis not present

## 2022-06-11 ENCOUNTER — Other Ambulatory Visit: Payer: Self-pay | Admitting: Internal Medicine

## 2022-06-11 DIAGNOSIS — E871 Hypo-osmolality and hyponatremia: Secondary | ICD-10-CM

## 2022-06-13 ENCOUNTER — Other Ambulatory Visit: Payer: Self-pay

## 2022-06-13 DIAGNOSIS — I1A0 Resistant hypertension: Secondary | ICD-10-CM

## 2022-06-13 MED ORDER — LABETALOL HCL 100 MG PO TABS: 100.0000 mg | ORAL_TABLET | Freq: Two times a day (BID) | ORAL | 3 refills | Status: DC

## 2022-06-20 ENCOUNTER — Other Ambulatory Visit: Payer: Self-pay | Admitting: Cardiology

## 2022-06-20 DIAGNOSIS — I1A0 Resistant hypertension: Secondary | ICD-10-CM

## 2022-06-24 ENCOUNTER — Telehealth: Payer: Self-pay

## 2022-06-24 NOTE — Telephone Encounter (Signed)
Called to discuss next PREP classes available at [facility], left voicemail

## 2022-07-02 DIAGNOSIS — I10 Essential (primary) hypertension: Secondary | ICD-10-CM | POA: Diagnosis not present

## 2022-07-25 ENCOUNTER — Encounter: Payer: Self-pay | Admitting: Internal Medicine

## 2022-07-25 ENCOUNTER — Ambulatory Visit: Payer: Medicare PPO | Admitting: Internal Medicine

## 2022-07-25 VITALS — BP 124/78 | HR 70 | Temp 97.7°F | Ht 63.0 in | Wt 173.2 lb

## 2022-07-25 DIAGNOSIS — R7309 Other abnormal glucose: Secondary | ICD-10-CM

## 2022-07-25 DIAGNOSIS — I5032 Chronic diastolic (congestive) heart failure: Secondary | ICD-10-CM | POA: Diagnosis not present

## 2022-07-25 DIAGNOSIS — K5909 Other constipation: Secondary | ICD-10-CM

## 2022-07-25 DIAGNOSIS — I110 Hypertensive heart disease with heart failure: Secondary | ICD-10-CM

## 2022-07-25 DIAGNOSIS — E6609 Other obesity due to excess calories: Secondary | ICD-10-CM

## 2022-07-25 DIAGNOSIS — Z6830 Body mass index (BMI) 30.0-30.9, adult: Secondary | ICD-10-CM | POA: Diagnosis not present

## 2022-07-25 MED ORDER — HYDROCORTISONE (PERIANAL) 2.5 % EX CREA: 1.0000 | TOPICAL_CREAM | Freq: Two times a day (BID) | CUTANEOUS | 1 refills | Status: DC | PRN

## 2022-07-25 MED ORDER — POLYETHYLENE GLYCOL 3350 17 G PO PACK: 17.0000 g | PACK | Freq: Every day | ORAL | 3 refills | Status: AC | PRN

## 2022-07-25 NOTE — Patient Instructions (Signed)
Hypertension, Adult ?Hypertension is another name for high blood pressure. High blood pressure forces your heart to work harder to pump blood. This can cause problems over time. ?There are two numbers in a blood pressure reading. There is a top number (systolic) over a bottom number (diastolic). It is best to have a blood pressure that is below 120/80. ?What are the causes? ?The cause of this condition is not known. Some other conditions can lead to high blood pressure. ?What increases the risk? ?Some lifestyle factors can make you more likely to develop high blood pressure: ?Smoking. ?Not getting enough exercise or physical activity. ?Being overweight. ?Having too much fat, sugar, calories, or salt (sodium) in your diet. ?Drinking too much alcohol. ?Other risk factors include: ?Having any of these conditions: ?Heart disease. ?Diabetes. ?High cholesterol. ?Kidney disease. ?Obstructive sleep apnea. ?Having a family history of high blood pressure and high cholesterol. ?Age. The risk increases with age. ?Stress. ?What are the signs or symptoms? ?High blood pressure may not cause symptoms. Very high blood pressure (hypertensive crisis) may cause: ?Headache. ?Fast or uneven heartbeats (palpitations). ?Shortness of breath. ?Nosebleed. ?Vomiting or feeling like you may vomit (nauseous). ?Changes in how you see. ?Very bad chest pain. ?Feeling dizzy. ?Seizures. ?How is this treated? ?This condition is treated by making healthy lifestyle changes, such as: ?Eating healthy foods. ?Exercising more. ?Drinking less alcohol. ?Your doctor may prescribe medicine if lifestyle changes do not help enough and if: ?Your top number is above 130. ?Your bottom number is above 80. ?Your personal target blood pressure may vary. ?Follow these instructions at home: ?Eating and drinking ? ?If told, follow the DASH eating plan. To follow this plan: ?Fill one half of your plate at each meal with fruits and vegetables. ?Fill one fourth of your plate  at each meal with whole grains. Whole grains include whole-wheat pasta, brown rice, and whole-grain bread. ?Eat or drink low-fat dairy products, such as skim milk or low-fat yogurt. ?Fill one fourth of your plate at each meal with low-fat (lean) proteins. Low-fat proteins include fish, chicken without skin, eggs, beans, and tofu. ?Avoid fatty meat, cured and processed meat, or chicken with skin. ?Avoid pre-made or processed food. ?Limit the amount of salt in your diet to less than 1,500 mg each day. ?Do not drink alcohol if: ?Your doctor tells you not to drink. ?You are pregnant, may be pregnant, or are planning to become pregnant. ?If you drink alcohol: ?Limit how much you have to: ?0-1 drink a day for women. ?0-2 drinks a day for men. ?Know how much alcohol is in your drink. In the [place] one drink equals one 12 oz bottle of beer (355 mL), one 5 oz glass of wine (148 mL), or one 1? oz glass of hard liquor (44 mL). ?Lifestyle ? ?Work with your doctor to stay at a healthy weight or to lose weight. Ask your doctor what the best weight is for you. ?Get at least 30 minutes of exercise that causes your heart to beat faster (aerobic exercise) most days of the week. This may include walking, swimming, or biking. ?Get at least 30 minutes of exercise that strengthens your muscles (resistance exercise) at least 3 days a week. This may include lifting weights or doing Pilates. ?Do not smoke or use any products that contain nicotine or tobacco. If you need help quitting, ask your doctor. ?Check your blood pressure at home as told by your doctor. ?Keep all follow-up visits. ?Medicines ?Take over-the-counter and prescription medicines   only as told by your doctor. Follow directions carefully. ?Do not skip doses of blood pressure medicine. The medicine does not work as well if you skip doses. Skipping doses also puts you at risk for problems. ?Ask your doctor about side effects or reactions to medicines that you should watch  for. ?Contact a doctor if: ?You think you are having a reaction to the medicine you are taking. ?You have headaches that keep coming back. ?You feel dizzy. ?You have swelling in your ankles. ?You have trouble with your vision. ?Get help right away if: ?You get a very bad headache. ?You start to feel mixed up (confused). ?You feel weak or numb. ?You feel faint. ?You have very bad pain in your: ?Chest. ?Belly (abdomen). ?You vomit more than once. ?You have trouble breathing. ?These symptoms may be an emergency. Get help right away. Call 911. ?Do not wait to see if the symptoms will go away. ?Do not drive yourself to the hospital. ?Summary ?Hypertension is another name for high blood pressure. ?High blood pressure forces your heart to work harder to pump blood. ?For most people, a normal blood pressure is less than 120/80. ?Making healthy choices can help lower blood pressure. If your blood pressure does not get lower with healthy choices, you may need to take medicine. ?This information is not intended to replace advice given to you by your health care provider. Make sure you discuss any questions you have with your health care provider. ?Document Revised: 12/31/2020 Document Reviewed: 12/31/2020 ?Elsevier Patient Education ? 2023 [employer] ? ?

## 2022-07-25 NOTE — Progress Notes (Signed)
I,Victoria T Hamilton,acting as a scribe for Gwynneth Aliment, MD.,have documented all relevant documentation on the behalf of Gwynneth Aliment, MD,as directed by  Gwynneth Aliment, MD while in the presence of Gwynneth Aliment, MD.    Subjective:     [name] , female    DOB: Mar 06, 1941 , 82 y.o.   MRN: [id number]   Chief Complaint  Patient presents with  . Hypertension    HPI  She presents today for HTN f/u. She reports compliance with meds. She denies headaches, chest pain and shortness of breath. She states she takes her BP daily and the readings get transmitted to her cardiologists office. She states they are usually in the 120s, occasionally in the 140s. She attributes to garlic supplementation as helping with her BP readings. She admits she is not exercising regularly.  She reports experiencing constipation. Initially started last week. She asks for a stool softener.    Hypertension This is a chronic problem. The current episode started more than 1 year ago. The problem has been gradually improving since onset. The problem is controlled. Pertinent negatives include no blurred vision, palpitations or shortness of breath. Risk factors for coronary artery disease include sedentary lifestyle, post-menopausal state and obesity. The current treatment provides moderate improvement. Compliance problems include exercise.      Past Medical History:  Diagnosis Date  . Arthritis    RA  . CAD (coronary artery disease)   . Fibroid, uterine   . Hypertension      Family History  Problem Relation Age of Onset  . Heart disease Mother   . Dementia Mother   . Congestive Heart Failure Mother   . Parkinson's disease Father   . Pancreatic cancer Sister      Current Outpatient Medications:  .  aspirin EC 81 MG tablet, Take 81 mg by mouth daily., Disp: , Rfl:  .  Cholecalciferol (VITAMIN D) 50 MCG (2000 UT) tablet, Take 2,000 Units by mouth daily. , Disp: , Rfl:  .   furosemide (LASIX) 20 MG tablet, TAKE 1 TABLET BY MOUTH EVERY DAY, Disp: 90 tablet, Rfl: 1 .  hydrocortisone (ANUSOL-HC) 2.5 % rectal cream, Place 1 Application rectally 2 (two) times daily as needed for hemorrhoids or anal itching., Disp: 30 g, Rfl: 1 .  hydrocortisone (ANUSOL-HC) 25 MG suppository, Place 1 suppository (25 mg total) rectally 2 (two) times daily. (Patient taking differently: Place 25 mg rectally as needed.), Disp: 12 suppository, Rfl: 0 .  labetalol (NORMODYNE) 100 MG tablet, Take 1 tablet (100 mg total) by mouth 2 (two) times daily., Disp: 180 tablet, Rfl: 3 .  mometasone (NASONEX) 50 MCG/ACT nasal spray, USE 2 SPRAYS IN EACH NOSTRIL DAILY AS NEEDED FOR ALLERGIES, Disp: 51 each, Rfl: 1 .  olmesartan-hydrochlorothiazide (BENICAR HCT) 40-25 MG tablet, TAKE 1 TABLET BY MOUTH EVERY DAY IN THE MORNING, Disp: 90 tablet, Rfl: 1 .  polyethylene glycol (MIRALAX) 17 g packet, Take 17 g by mouth daily as needed., Disp: 30 each, Rfl: 3   Allergies  Allergen Reactions  . Spironolactone Other (See Comments)    Hyponatremia     Review of Systems  Constitutional: Negative.   Eyes:  Negative for blurred vision.  Respiratory: Negative.  Negative for shortness of breath.   Cardiovascular: Negative.  Negative for palpitations.  Neurological: Negative.   Psychiatric/Behavioral: Negative.       Today's Vitals   07/25/22 0901 07/25/22 0919  BP: (!) 140/90 124/78  Pulse:  70   Temp: 97.7 F (36.5 C)   SpO2: 98%   Weight: 173 lb 3.2 oz (78.6 kg)   Height: 5\' 3"  (1.6 m)    Body mass index is 30.68 kg/m.  Wt Readings from Last 3 Encounters:  07/25/22 173 lb 3.2 oz (78.6 kg)  01/20/22 174 lb 9.6 oz (79.2 kg)  01/19/22 175 lb 3.2 oz (79.5 kg)    Objective:  Physical Exam Vitals and nursing note reviewed.  Constitutional:      Appearance: Normal appearance.  HENT:     Head: Normocephalic and atraumatic.     Nose:     Comments: Masked     Mouth/Throat:     Comments: Masked Eyes:      Extraocular Movements: Extraocular movements intact.  Cardiovascular:     Rate and Rhythm: Normal rate and regular rhythm.     Heart sounds: Normal heart sounds.  Pulmonary:     Effort: Pulmonary effort is normal.     Breath sounds: Normal breath sounds.  Musculoskeletal:     Cervical back: Normal range of motion.  Skin:    General: Skin is warm.  Neurological:     General: No focal deficit present.     Mental Status: She is alert.  Psychiatric:        Mood and Affect: Mood normal.        Behavior: Behavior normal.        Assessment And Plan:     1. Hypertensive heart disease with chronic diastolic congestive heart failure (HCC) - Microalbumin / creatinine urine ratio - CMP14+EGFR  2. Other abnormal glucose - CMP14+EGFR - Hemoglobin A1c  3. Other constipation  4. Class 1 obesity due to excess calories with serious comorbidity and body mass index (BMI) of 30.0 to 30.9 in adult Comments: She is encouraged to aim for at least 150 minutes of exercise She is encouraged to strive for BMI less than 30 to decrease cardiac risk. Advised to aim for at least 150 minutes of exercise per week.    Patient was given opportunity to ask questions. Patient verbalized understanding of the plan and was able to repeat key elements of the plan. All questions were answered to their satisfaction.  Gwynneth Aliment, MD   I, Gwynneth Aliment, MD, have reviewed all documentation for this visit. The documentation on 07/25/22 for the exam, diagnosis, procedures, and orders are all accurate and complete.   IF YOU HAVE BEEN REFERRED TO A SPECIALIST, IT MAY TAKE 1-2 WEEKS TO SCHEDULE/PROCESS THE REFERRAL. IF YOU HAVE NOT HEARD FROM US/SPECIALIST IN TWO WEEKS, PLEASE GIVE [place] A CALL AT [phone].   THE PATIENT IS ENCOURAGED TO PRACTICE SOCIAL DISTANCING DUE TO THE COVID-19 PANDEMIC.

## 2022-07-26 LAB — HEMOGLOBIN A1C
Est. average glucose Bld gHb Est-mCnc: 114 mg/dL
Hgb A1c MFr Bld: 5.6 % (ref 4.8–5.6)

## 2022-07-26 LAB — CMP14+EGFR
ALT: 10 IU/L (ref 0–32)
AST: 20 IU/L (ref 0–40)
Albumin/Globulin Ratio: 2 (ref 1.2–2.2)
Albumin: 3.8 g/dL (ref 3.7–4.7)
Alkaline Phosphatase: 63 IU/L (ref 44–121)
BUN/Creatinine Ratio: 11 — ABNORMAL LOW (ref 12–28)
BUN: 8 mg/dL (ref 8–27)
Bilirubin Total: 0.7 mg/dL (ref 0.0–1.2)
CO2: 25 mmol/L (ref 20–29)
Calcium: 9.5 mg/dL (ref 8.7–10.3)
Chloride: 98 mmol/L (ref 96–106)
Creatinine, Ser: 0.76 mg/dL (ref 0.57–1.00)
Globulin, Total: 1.9 g/dL (ref 1.5–4.5)
Glucose: 99 mg/dL (ref 70–99)
Potassium: 4.1 mmol/L (ref 3.5–5.2)
Sodium: 139 mmol/L (ref 134–144)
Total Protein: 5.7 g/dL — ABNORMAL LOW (ref 6.0–8.5)
eGFR: 79 mL/min/{1.73_m2} (ref >59)

## 2022-07-26 LAB — MICROALBUMIN / CREATININE URINE RATIO
Creatinine, Urine: 119.7 mg/dL
Microalb/Creat Ratio: <3 mg/g creat (ref 0–29)
Microalbumin, Urine: <3 ug/mL

## 2022-08-31 DIAGNOSIS — I10 Essential (primary) hypertension: Secondary | ICD-10-CM | POA: Diagnosis not present

## 2022-11-22 ENCOUNTER — Encounter: Payer: Self-pay | Admitting: Internal Medicine

## 2022-11-22 ENCOUNTER — Ambulatory Visit: Payer: Medicare PPO | Admitting: Internal Medicine

## 2022-11-22 VITALS — BP 110/80 | HR 88 | Temp 98.7°F | Ht 63.0 in | Wt 169.0 lb

## 2022-11-22 DIAGNOSIS — E663 Overweight: Secondary | ICD-10-CM

## 2022-11-22 DIAGNOSIS — R7309 Other abnormal glucose: Secondary | ICD-10-CM | POA: Diagnosis not present

## 2022-11-22 DIAGNOSIS — Z2089 Contact with and (suspected) exposure to other communicable diseases: Secondary | ICD-10-CM

## 2022-11-22 DIAGNOSIS — I5032 Chronic diastolic (congestive) heart failure: Secondary | ICD-10-CM | POA: Diagnosis not present

## 2022-11-22 DIAGNOSIS — I110 Hypertensive heart disease with heart failure: Secondary | ICD-10-CM

## 2022-11-22 DIAGNOSIS — Z6829 Body mass index (BMI) 29.0-29.9, adult: Secondary | ICD-10-CM

## 2022-11-22 NOTE — Patient Instructions (Signed)
Hypertension, Adult Hypertension is another name for high blood pressure. High blood pressure forces your heart to work harder to pump blood. This can cause problems over time. There are two numbers in a blood pressure reading. There is a top number (systolic) over a bottom number (diastolic). It is best to have a blood pressure that is below 120/80. What are the causes? The cause of this condition is not known. Some other conditions can lead to high blood pressure. What increases the risk? Some lifestyle factors can make you more likely to develop high blood pressure: Smoking. Not getting enough exercise or physical activity. Being overweight. Having too much fat, sugar, calories, or salt (sodium) in your diet. Drinking too much alcohol. Other risk factors include: Having any of these conditions: Heart disease. Diabetes. High cholesterol. Kidney disease. Obstructive sleep apnea. Having a family history of high blood pressure and high cholesterol. Age. The risk increases with age. Stress. What are the signs or symptoms? High blood pressure may not cause symptoms. Very high blood pressure (hypertensive crisis) may cause: Headache. Fast or uneven heartbeats (palpitations). Shortness of breath. Nosebleed. Vomiting or feeling like you may vomit (nauseous). Changes in how you see. Very bad chest pain. Feeling dizzy. Seizures. How is this treated? This condition is treated by making healthy lifestyle changes, such as: Eating healthy foods. Exercising more. Drinking less alcohol. Your doctor may prescribe medicine if lifestyle changes do not help enough and if: Your top number is above 130. Your bottom number is above 80. Your personal target blood pressure may vary. Follow these instructions at home: Eating and drinking  If told, follow the DASH eating plan. To follow this plan: Fill one half of your plate at each meal with fruits and vegetables. Fill one fourth of your plate  at each meal with whole grains. Whole grains include whole-wheat pasta, brown rice, and whole-grain bread. Eat or drink low-fat dairy products, such as skim milk or low-fat yogurt. Fill one fourth of your plate at each meal with low-fat (lean) proteins. Low-fat proteins include fish, chicken without skin, eggs, beans, and tofu. Avoid fatty meat, cured and processed meat, or chicken with skin. Avoid pre-made or processed food. Limit the amount of salt in your diet to less than 1,500 mg each day. Do not drink alcohol if: Your doctor tells you not to drink. You are pregnant, may be pregnant, or are planning to become pregnant. If you drink alcohol: Limit how much you have to: 0-1 drink a day for women. 0-2 drinks a day for men. Know how much alcohol is in your drink. In the [place] one drink equals one 12 oz bottle of beer (355 mL), one 5 oz glass of wine (148 mL), or one 1 oz glass of hard liquor (44 mL). Lifestyle  Work with your doctor to stay at a healthy weight or to lose weight. Ask your doctor what the best weight is for you. Get at least 30 minutes of exercise that causes your heart to beat faster (aerobic exercise) most days of the week. This may include walking, swimming, or biking. Get at least 30 minutes of exercise that strengthens your muscles (resistance exercise) at least 3 days a week. This may include lifting weights or doing Pilates. Do not smoke or use any products that contain nicotine or tobacco. If you need help quitting, ask your doctor. Check your blood pressure at home as told by your doctor. Keep all follow-up visits. Medicines Take over-the-counter and prescription medicines   only as told by your doctor. Follow directions carefully. Do not skip doses of blood pressure medicine. The medicine does not work as well if you skip doses. Skipping doses also puts you at risk for problems. Ask your doctor about side effects or reactions to medicines that you should watch  for. Contact a doctor if: You think you are having a reaction to the medicine you are taking. You have headaches that keep coming back. You feel dizzy. You have swelling in your ankles. You have trouble with your vision. Get help right away if: You get a very bad headache. You start to feel mixed up (confused). You feel weak or numb. You feel faint. You have very bad pain in your: Chest. Belly (abdomen). You vomit more than once. You have trouble breathing. These symptoms may be an emergency. Get help right away. Call 911. Do not wait to see if the symptoms will go away. Do not drive yourself to the hospital. Summary Hypertension is another name for high blood pressure. High blood pressure forces your heart to work harder to pump blood. For most people, a normal blood pressure is less than 120/80. Making healthy choices can help lower blood pressure. If your blood pressure does not get lower with healthy choices, you may need to take medicine. This information is not intended to replace advice given to you by your health care provider. Make sure you discuss any questions you have with your health care provider. Document Revised: 12/31/2020 Document Reviewed: 12/31/2020 Elsevier Patient Education  2024 [employer]  

## 2022-11-22 NOTE — Progress Notes (Signed)
I,[name], CMA,acting as a Neurosurgeon for Tina Aliment, MD.,have documented all relevant documentation on the behalf of Tina Aliment, MD,as directed by  Tina Aliment, MD while in the presence of Tina Aliment, MD.  Subjective:  [name] , female    DOB: 10-02-1940 , 82 y.o.   MRN: [id number]  Chief Complaint  Patient presents with   Hypertension    HPI  She presents today for HTN f/u. She reports compliance with meds. She denies headaches, chest pain and shortness of breath.   She reports coming in contact with a friend that has meningitis. She reports wanting to know if there is a blood test available for her to see if she has it. She states a church member w/ dementia was locked out of their house, so she went over to check on her. She states her friend was weak, so she helped her walk around to the back of her home. She states she was standing really close to the lady for about five minutes or more. She was advised by another church member that lady has been admitted to the hospital and has meningitis. She thinks it is bacterial vs. viral meningitis. She denies having any fever, chills, headaches and rash.    Hypertension This is a chronic problem. The current episode started more than 1 year ago. The problem has been gradually improving since onset. The problem is controlled. Pertinent negatives include no blurred vision, palpitations or shortness of breath. Risk factors for coronary artery disease include sedentary lifestyle, post-menopausal state and obesity. The current treatment provides moderate improvement. Compliance problems include exercise.      Past Medical History:  Diagnosis Date   Arthritis    RA   CAD (coronary artery disease)    Fibroid, uterine    Hypertension      Family History  Problem Relation Age of Onset   Heart disease Mother    Dementia Mother    Congestive Heart Failure Mother    Parkinson's disease Father     Pancreatic cancer Sister      Current Outpatient Medications:    aspirin EC 81 MG tablet, Take 81 mg by mouth daily., Disp: , Rfl:    Cholecalciferol (VITAMIN D) 50 MCG (2000 UT) tablet, Take 2,000 Units by mouth daily. , Disp: , Rfl:    furosemide (LASIX) 20 MG tablet, TAKE 1 TABLET BY MOUTH EVERY DAY, Disp: 90 tablet, Rfl: 1   hydrocortisone (ANUSOL-HC) 2.5 % rectal cream, Place 1 Application rectally 2 (two) times daily as needed for hemorrhoids or anal itching., Disp: 30 g, Rfl: 1   hydrocortisone (ANUSOL-HC) 25 MG suppository, Place 1 suppository (25 mg total) rectally 2 (two) times daily. (Patient taking differently: Place 25 mg rectally as needed.), Disp: 12 suppository, Rfl: 0   labetalol (NORMODYNE) 100 MG tablet, Take 1 tablet (100 mg total) by mouth 2 (two) times daily., Disp: 180 tablet, Rfl: 3   mometasone (NASONEX) 50 MCG/ACT nasal spray, USE 2 SPRAYS IN EACH NOSTRIL DAILY AS NEEDED FOR ALLERGIES, Disp: 51 each, Rfl: 1   olmesartan-hydrochlorothiazide (BENICAR HCT) 40-25 MG tablet, TAKE 1 TABLET BY MOUTH EVERY DAY IN THE MORNING, Disp: 90 tablet, Rfl: 1   polyethylene glycol (MIRALAX) 17 g packet, Take 17 g by mouth daily as needed., Disp: 30 each, Rfl: 3   Allergies  Allergen Reactions   Spironolactone Other (See Comments)    Hyponatremia     Review of  Systems  Constitutional: Negative.   Eyes:  Negative for blurred vision.  Respiratory: Negative.  Negative for shortness of breath.   Cardiovascular: Negative.  Negative for palpitations.  Gastrointestinal: Negative.   Neurological: Negative.   Psychiatric/Behavioral: Negative.       Today's Vitals   11/22/22 0851  BP: 110/80  Pulse: 88  Temp: 98.7 F (37.1 C)  Weight: 169 lb (76.7 kg)  Height: 5\' 3"  (1.6 m)   Body mass index is 29.94 kg/m.  Wt Readings from Last 3 Encounters:  11/22/22 169 lb (76.7 kg)  07/25/22 173 lb 3.2 oz (78.6 kg)  01/20/22 174 lb 9.6 oz (79.2 kg)     Objective:  Physical  Exam Vitals and nursing note reviewed.  Constitutional:      Appearance: Normal appearance.  HENT:     Head: Normocephalic and atraumatic.  Eyes:     Extraocular Movements: Extraocular movements intact.  Cardiovascular:     Rate and Rhythm: Normal rate and regular rhythm.     Heart sounds: Normal heart sounds.  Pulmonary:     Effort: Pulmonary effort is normal.     Breath sounds: Normal breath sounds.  Musculoskeletal:     Cervical back: Normal range of motion.     Right lower leg: Edema present.     Left lower leg: Edema present.  Skin:    General: Skin is warm.  Neurological:     General: No focal deficit present.     Mental Status: She is alert.  Psychiatric:        Mood and Affect: Mood normal.        Behavior: Behavior normal.         Assessment And Plan:  Hypertensive heart disease with chronic diastolic congestive heart failure (HCC) Assessment & Plan: Chronic, well controlled. She will continue with furosemide 20mg , olmesartan/hct 40/25mg  and labetalol 100mg  twice daily. Encouraged to follow low sodium diet.   Orders: -     Basic metabolic panel  Other abnormal glucose Assessment & Plan: Previous labs reviewed, her A1c has been elevated in the past. I will check an A1c today. Reminded to avoid refined sugars including sugary drinks/foods and processed meats including bacon, sausages and deli meats.    Orders: -     Hemoglobin A1c -     Basic metabolic panel  Overweight with body mass index (BMI) of 29 to 29.9 in adult Assessment & Plan: He is encouraged to aim for at least 150 minutes of exercise/week.    Exposure to meningitis Assessment & Plan: Pt reports close exposure to someone with meningitis, possibly bacterial. Pt encouraged to confirm patient's diagnosis. Advised to seek evaluation ASAP if she develops headache, fever, chills, rash and/or body aches. She will also contact [facility] Department regarding her exposure.       Return if symptoms  worsen or fail to improve.  Patient was given opportunity to ask questions. Patient verbalized understanding of the plan and was able to repeat key elements of the plan. All questions were answered to their satisfaction.    I, Tina Aliment, MD, have reviewed all documentation for this visit. The documentation on 11/22/22 for the exam, diagnosis, procedures, and orders are all accurate and complete.   IF YOU HAVE BEEN REFERRED TO A SPECIALIST, IT MAY TAKE 1-2 WEEKS TO SCHEDULE/PROCESS THE REFERRAL. IF YOU HAVE NOT HEARD FROM US/SPECIALIST IN TWO WEEKS, PLEASE GIVE [place] A CALL AT [phone].   THE PATIENT IS ENCOURAGED  TO PRACTICE SOCIAL DISTANCING DUE TO THE COVID-19 PANDEMIC.

## 2022-11-23 LAB — BASIC METABOLIC PANEL
BUN/Creatinine Ratio: 17 (ref 12–28)
BUN: 13 mg/dL (ref 8–27)
CO2: 25 mmol/L (ref 20–29)
Calcium: 9.3 mg/dL (ref 8.7–10.3)
Chloride: 98 mmol/L (ref 96–106)
Creatinine, Ser: 0.78 mg/dL (ref 0.57–1.00)
Glucose: 97 mg/dL (ref 70–99)
Potassium: 3.9 mmol/L (ref 3.5–5.2)
Sodium: 135 mmol/L (ref 134–144)
eGFR: 76 mL/min/{1.73_m2} (ref >59)

## 2022-11-23 LAB — HEMOGLOBIN A1C
Est. average glucose Bld gHb Est-mCnc: 108 mg/dL
Hgb A1c MFr Bld: 5.4 % (ref 4.8–5.6)

## 2022-11-24 ENCOUNTER — Ambulatory Visit: Payer: Medicare PPO | Admitting: Internal Medicine

## 2022-12-03 ENCOUNTER — Other Ambulatory Visit: Payer: Self-pay | Admitting: Internal Medicine

## 2022-12-03 DIAGNOSIS — E871 Hypo-osmolality and hyponatremia: Secondary | ICD-10-CM

## 2022-12-04 DIAGNOSIS — Z6829 Body mass index (BMI) 29.0-29.9, adult: Secondary | ICD-10-CM | POA: Insufficient documentation

## 2022-12-04 DIAGNOSIS — Z2089 Contact with and (suspected) exposure to other communicable diseases: Secondary | ICD-10-CM | POA: Insufficient documentation

## 2022-12-04 NOTE — Assessment & Plan Note (Signed)
Previous labs reviewed, her A1c has been elevated in the past. I will check an A1c today. Reminded to avoid refined sugars including sugary drinks/foods and processed meats including bacon, sausages and deli meats.  

## 2022-12-04 NOTE — Assessment & Plan Note (Signed)
He is encouraged to aim for at least 150 minutes of exercise/week.

## 2022-12-04 NOTE — Assessment & Plan Note (Signed)
Chronic, well controlled. She will continue with furosemide 20mg , olmesartan/hct 40/25mg  and labetalol 100mg  twice daily. Encouraged to follow low sodium diet.

## 2022-12-04 NOTE — Assessment & Plan Note (Signed)
Pt reports close exposure to someone with meningitis, possibly bacterial. Pt encouraged to confirm patient's diagnosis. Advised to seek evaluation ASAP if she develops headache, fever, chills, rash and/or body aches. She will also contact [facility] Department regarding her exposure.

## 2023-02-08 ENCOUNTER — Ambulatory Visit: Payer: Medicare PPO

## 2023-02-08 ENCOUNTER — Ambulatory Visit: Payer: Self-pay | Admitting: Internal Medicine

## 2023-02-08 DIAGNOSIS — Z0000 Encounter for general adult medical examination without abnormal findings: Secondary | ICD-10-CM | POA: Diagnosis not present

## 2023-02-08 NOTE — Patient Instructions (Signed)
Tina Ware , Thank you for taking time to come for your Medicare Wellness Visit. I appreciate your ongoing commitment to your health goals. Please review the following plan we discussed and let me know if I can assist you in the future.   Referrals/Orders/Follow-Ups/Clinician Recommendations: none  This is a list of the screening recommended for you and due dates:  Health Maintenance  Topic Date Due   COVID-19 Vaccine (6 - 2023-24 season) 03/18/2023   Mammogram  04/27/2023   DEXA scan (bone density measurement)  05/06/2023   Medicare Annual Wellness Visit  02/08/2024   DTaP/Tdap/Td vaccine (2 - Td or Tdap) 07/07/2027   Pneumonia Vaccine  Completed   Flu Shot  Completed   Zoster (Shingles) Vaccine  Completed   HPV Vaccine  Aged Out   Hepatitis C Screening  Discontinued    Advanced directives: (Provided) Advance directive discussed with you today. I have provided a copy for you to complete at home and have notarized. Once this is complete, please bring a copy in to our office so we can scan it into your chart.   Next Medicare Annual Wellness Visit scheduled for next year: No, office will schedule appointment  Insert Preventive Care attachment Insert FALL PREVENTION attachment if needed

## 2023-02-08 NOTE — Progress Notes (Signed)
Subjective:   Tina Ware is a 82 y.o. female who presents for Medicare Annual (Subsequent) preventive examination.  Visit Complete: Virtual I connected with  Belenda Cruise on 02/08/23 by a audio enabled telemedicine application and verified that I am speaking with the correct person using two identifiers.  Patient Location: Home  Provider Location: Office/Clinic  I discussed the limitations of evaluation and management by telemedicine. The patient expressed understanding and agreed to proceed.  Vital Signs: Because this visit was a virtual/telehealth visit, some criteria may be missing or patient reported. Any vitals not documented were not able to be obtained and vitals that have been documented are patient reported.  Patient Medicare AWV questionnaire was completed by the patient on 02/07/2023; I have confirmed that all information answered by patient is correct and no changes since this date.  Cardiac Risk Factors include: advanced age (>37men, >47 women);hypertension     Objective:    Today's Vitals   There is no height or weight on file to calculate BMI.     02/08/2023    8:59 AM 01/19/2022    8:27 AM 01/14/2021   11:35 AM 09/22/2020    7:49 AM 01/01/2020    3:17 PM 06/04/2019    3:24 PM 01/10/2019    1:38 PM  Advanced Directives  Does Patient Have a Medical Advance Directive? No No No No No No No  Would patient like information on creating a medical advance directive?  No - Patient declined Yes (MAU/Ambulatory/Procedural Areas - Information given)  Yes (MAU/Ambulatory/Procedural Areas - Information given) No - Patient declined No - Patient declined    Current Medications (verified) Outpatient Encounter Medications as of 02/08/2023  Medication Sig   aspirin EC 81 MG tablet Take 81 mg by mouth daily.   Cholecalciferol (VITAMIN D) 50 MCG (2000 UT) tablet Take 2,000 Units by mouth daily.    furosemide (LASIX) 20 MG tablet TAKE 1 TABLET BY MOUTH EVERY DAY    hydrocortisone (ANUSOL-HC) 2.5 % rectal cream Place 1 Application rectally 2 (two) times daily as needed for hemorrhoids or anal itching.   hydrocortisone (ANUSOL-HC) 25 MG suppository Place 1 suppository (25 mg total) rectally 2 (two) times daily. (Patient taking differently: Place 25 mg rectally as needed.)   labetalol (NORMODYNE) 100 MG tablet Take 1 tablet (100 mg total) by mouth 2 (two) times daily.   mometasone (NASONEX) 50 MCG/ACT nasal spray USE 2 SPRAYS IN EACH NOSTRIL DAILY AS NEEDED FOR ALLERGIES   olmesartan-hydrochlorothiazide (BENICAR HCT) 40-25 MG tablet TAKE 1 TABLET BY MOUTH EVERY DAY IN THE MORNING   polyethylene glycol (MIRALAX) 17 g packet Take 17 g by mouth daily as needed.   No facility-administered encounter medications on file as of 02/08/2023.    Allergies (verified) Spironolactone   History: Past Medical History:  Diagnosis Date   Arthritis    RA   CAD (coronary artery disease)    Cataract 06/ 2020   Cataracts in June and July 2020   Clotting disorder [facility]) 09/ 2020   Occasional hemorrhoidal bleeding   Fibroid, uterine    Hypertension    Past Surgical History:  Procedure Laterality Date   CARPAL TUNNEL RELEASE Right 12/20/2018   CATARACT EXTRACTION, BILATERAL     CHOLECYSTECTOMY     EYE SURGERY     08/2018 and [id number]   TRIGGER FINGER RELEASE Right 12/20/2018   TUBAL LIGATION  3/25 /1985   Family History  Problem Relation Age of Onset   Heart  disease Mother    Dementia Mother    Congestive Heart Failure Mother    Arthritis Mother    COPD Mother    Hypertension Mother    Parkinson's disease Father    Asthma Father    Pancreatic cancer Sister    Asthma Sister    Cancer Sister    Social History   Socioeconomic History   Marital status: Single    Spouse name: Not on file   Number of children: 1   Years of education: Not on file   Highest education level: Bachelor's degree (e.g., BA, AB, BS)  Occupational History   Not on file  Tobacco  Use   Smoking status: Never    Passive exposure: Past   Smokeless tobacco: Never   Tobacco comments:    I never smoked.  Vaping Use   Vaping status: Never Used  Substance and Sexual Activity   Alcohol use: No   Drug use: No   Sexual activity: Not Currently    Birth control/protection: Post-menopausal, None  Other Topics Concern   Not on file  Social History Narrative   Right handed   Lives in a one story home    Drinks tea    Social Determinants of Health   Financial Resource Strain: Low Risk  (02/07/2023)   Overall Financial Resource Strain (CARDIA)    Difficulty of Paying Living Expenses: Not hard at all  Food Insecurity: No Food Insecurity (02/07/2023)   Hunger Vital Sign    Worried About Running Out of Food in the Last Year: Never true    Ran Out of Food in the Last Year: Never true  Transportation Needs: No Transportation Needs (02/07/2023)   PRAPARE - Administrator, Civil Service (Medical): No    Lack of Transportation (Non-Medical): No  Physical Activity: Insufficiently Active (02/07/2023)   Exercise Vital Sign    Days of Exercise per Week: 3 days    Minutes of Exercise per Session: 10 min  Stress: No Stress Concern Present (02/07/2023)   [employer] of Occupational Health - Occupational Stress Questionnaire    Feeling of Stress : Not at all  Social Connections: Moderately Integrated (02/07/2023)   Social Connection and Isolation Panel [NHANES]    Frequency of Communication with Friends and Family: More than three times a week    Frequency of Social Gatherings with Friends and Family: Twice a week    Attends Religious Services: More than 4 times per year    Active Member of [employer] or Organizations: Yes    Attends Engineer, structural: More than 4 times per year    Marital Status: Divorced    Tobacco Counseling Counseling given: Not Answered Tobacco comments: I never smoked.   Clinical Intake:  Pre-visit preparation completed:  Yes  Pain : No/denies pain     Nutritional Risks: None Diabetes: No  How often do you need to have someone help you when you read instructions, pamphlets, or other written materials from your doctor or pharmacy?: 1 - Never  Interpreter Needed?: No  Information entered by :: NAllen LPN   Activities of Daily Living    02/07/2023   11:14 AM  In your present state of health, do you have any difficulty performing the following activities:  Hearing? 0  Vision? 0  Difficulty concentrating or making decisions? 0  Walking or climbing stairs? 0  Dressing or bathing? 0  Doing errands, shopping? 0  Preparing Food and eating ? N  Using the Toilet? N  In the past six months, have you accidently leaked urine? N  Do you have problems with loss of bowel control? N  Managing your Medications? N  Managing your Finances? N  Housekeeping or managing your Housekeeping? N    Patient Care Team: Dorothyann Peng, MD as PCP - General (Internal Medicine)  Indicate any recent Medical Services you may have received from other than [facility] providers in the past year (date may be approximate).     Assessment:   This is a routine wellness examination for [place].  Hearing/Vision screen Hearing Screening - Comments:: Denies hearing issues Vision Screening - Comments:: Regular eye exams, [facility]   Goals Addressed             This Visit's Progress    Patient Stated       02/08/2023, wants to lose weight       Depression Screen    02/08/2023    9:00 AM 11/22/2022    8:50 AM 07/25/2022    8:59 AM 01/19/2022    8:27 AM 01/14/2021   11:37 AM 01/06/2021   11:07 AM 01/01/2020    3:19 PM  PHQ 2/9 Scores  PHQ - 2 Score 0 0 0 0 0 0 0  PHQ- 9 Score 0 0 0        Fall Risk    02/07/2023   11:14 AM 11/22/2022    8:50 AM 07/25/2022    8:59 AM 01/19/2022    8:27 AM 01/17/2022    2:22 PM  Fall Risk   Falls in the past year? 0 0 0 0 0  Number falls in past yr: 0 0 0 0   Injury with Fall? 0  0 0 0   Risk for fall due to : Medication side effect No Fall Risks No Fall Risks Medication side effect   Follow up Falls prevention discussed;Falls evaluation completed Falls evaluation completed Falls evaluation completed Falls prevention discussed;Education provided;Falls evaluation completed     MEDICARE RISK AT HOME: Medicare Risk at Home Any stairs in or around the home?: No If so, are there any without handrails?: No Home free of loose throw rugs in walkways, pet beds, electrical cords, etc?: Yes Adequate lighting in your home to reduce risk of falls?: Yes Life alert?: No Use of a cane, walker or w/c?: No Grab bars in the bathroom?: Yes Shower chair or bench in shower?: No Elevated toilet seat or a handicapped toilet?: No  TIMED UP AND GO:  Was the test performed?  No    Cognitive Function:        02/08/2023    9:01 AM 01/19/2022    8:28 AM 01/14/2021   11:38 AM 01/01/2020    3:20 PM 12/25/2018    3:06 PM  6CIT Screen  What Year? 0 points 0 points 0 points 0 points 0 points  What month? 0 points 0 points 0 points 0 points 0 points  What time? 0 points 0 points 0 points 0 points 0 points  Count back from 20 0 points 0 points 0 points 0 points 0 points  Months in reverse 0 points 0 points 0 points 0 points 0 points  Repeat phrase 4 points 2 points 4 points 4 points 0 points  Total Score 4 points 2 points 4 points 4 points 0 points    Immunizations Immunization History  Administered Date(s) Administered   Fluad Quad(high Dose 65+) 01/01/2020, 01/06/2021, 12/25/2021   Influenza, High Dose  Seasonal PF 01/15/2018, 01/09/2019   Moderna Covid-19 Vaccine Bivalent Booster 40yrs & up 02/08/2021, 12/25/2021   Moderna SARS-COV2 Booster Vaccination 01/24/2020, 08/25/2020   Moderna Sars-Covid-2 Vaccination 04/21/2019, 05/19/2019, 02/08/2021, 12/18/2021   Pneumococcal Conjugate-13 12/13/2018   Pneumococcal Polysaccharide-23 07/21/2021   Tdap 07/06/2017   Zoster  Recombinant(Shingrix) 11/09/2018, 02/28/2019, 03/26/2019    TDAP status: Up to date  Flu Vaccine status: Up to date  Pneumococcal vaccine status: Up to date  Covid-19 vaccine status: Completed vaccines  Qualifies for Shingles Vaccine? Yes   Zostavax completed Yes   Shingrix Completed?: Yes  Screening Tests Health Maintenance  Topic Date Due   COVID-19 Vaccine (6 - 2023-24 season) 03/18/2023   MAMMOGRAM  04/27/2023   DEXA SCAN  05/06/2023   Medicare Annual Wellness (AWV)  02/08/2024   DTaP/Tdap/Td (2 - Td or Tdap) 07/07/2027   Pneumonia Vaccine 73+ Years old  Completed   INFLUENZA VACCINE  Completed   Zoster Vaccines- Shingrix  Completed   HPV VACCINES  Aged Out   Hepatitis C Screening  Discontinued    Health Maintenance  There are no preventive care reminders to display for this patient.   Colorectal cancer screening: No longer required.   Mammogram status: Completed 04/26/2022. Repeat every year  Bone Density status: Completed 05/05/2021.   Lung Cancer Screening: (Low Dose CT Chest recommended if Age 11-80 years, 20 pack-year currently smoking OR have quit w/in 15years.) does not qualify.   Lung Cancer Screening Referral: no  Additional Screening:  Hepatitis C Screening: does not qualify;   Vision Screening: Recommended annual ophthalmology exams for early detection of glaucoma and other disorders of the eye. Is the patient up to date with their annual eye exam?  Yes  Who is the provider or what is the name of the office in which the patient attends annual eye exams? [facility] If pt is not established with a provider, would they like to be referred to a provider to establish care? No .   Dental Screening: Recommended annual dental exams for proper oral hygiene  Diabetic Foot Exam: n/a  Community Resource Referral / Chronic Care Management: CRR required this visit?  No   CCM required this visit?  No     Plan:     I have personally reviewed and noted the  following in the patient's chart:   Medical and social history Use of alcohol, tobacco or illicit drugs  Current medications and supplements including opioid prescriptions. Patient is not currently taking opioid prescriptions. Functional ability and status Nutritional status Physical activity Advanced directives List of other physicians Hospitalizations, surgeries, and ER visits in previous 12 months Vitals Screenings to include cognitive, depression, and falls Referrals and appointments  In addition, I have reviewed and discussed with patient certain preventive protocols, quality metrics, and best practice recommendations. A written personalized care plan for preventive services as well as general preventive health recommendations were provided to patient.     Barb Merino, LPN   [id number]/29/5621   After Visit Summary: (MyChart) Due to this being a telephonic visit, the after visit summary with patients personalized plan was offered to patient via MyChart   Nurse Notes: none

## 2023-02-14 ENCOUNTER — Encounter: Payer: Self-pay | Admitting: Internal Medicine

## 2023-02-14 ENCOUNTER — Ambulatory Visit: Payer: Medicare PPO | Admitting: Internal Medicine

## 2023-02-14 VITALS — BP 170/98 | HR 67 | Temp 98.5°F | Ht 63.0 in | Wt 169.4 lb

## 2023-02-14 DIAGNOSIS — Z01411 Encounter for gynecological examination (general) (routine) with abnormal findings: Secondary | ICD-10-CM | POA: Insufficient documentation

## 2023-02-14 DIAGNOSIS — I110 Hypertensive heart disease with heart failure: Secondary | ICD-10-CM | POA: Diagnosis not present

## 2023-02-14 DIAGNOSIS — E66811 Obesity, class 1: Secondary | ICD-10-CM

## 2023-02-14 DIAGNOSIS — R7309 Other abnormal glucose: Secondary | ICD-10-CM | POA: Diagnosis not present

## 2023-02-14 DIAGNOSIS — H6123 Impacted cerumen, bilateral: Secondary | ICD-10-CM

## 2023-02-14 DIAGNOSIS — Z0000 Encounter for general adult medical examination without abnormal findings: Secondary | ICD-10-CM

## 2023-02-14 DIAGNOSIS — Z0001 Encounter for general adult medical examination with abnormal findings: Secondary | ICD-10-CM | POA: Diagnosis not present

## 2023-02-14 DIAGNOSIS — E6609 Other obesity due to excess calories: Secondary | ICD-10-CM

## 2023-02-14 DIAGNOSIS — K649 Unspecified hemorrhoids: Secondary | ICD-10-CM | POA: Diagnosis not present

## 2023-02-14 DIAGNOSIS — I5032 Chronic diastolic (congestive) heart failure: Secondary | ICD-10-CM

## 2023-02-14 DIAGNOSIS — E2839 Other primary ovarian failure: Secondary | ICD-10-CM | POA: Insufficient documentation

## 2023-02-14 DIAGNOSIS — Z6830 Body mass index (BMI) 30.0-30.9, adult: Secondary | ICD-10-CM

## 2023-02-14 DIAGNOSIS — I1A0 Resistant hypertension: Secondary | ICD-10-CM

## 2023-02-14 HISTORY — DX: Encounter for gynecological examination (general) (routine) with abnormal findings: Z01.411

## 2023-02-14 MED ORDER — OLMESARTAN MEDOXOMIL-HCTZ 40-25 MG PO TABS
1.0000 | ORAL_TABLET | Freq: Every day | ORAL | 1 refills | Status: DC
Start: 2023-02-14 — End: 2023-10-04

## 2023-02-14 MED ORDER — HYDROCORTISONE (PERIANAL) 2.5 % EX CREA: 1.0000 | TOPICAL_CREAM | Freq: Two times a day (BID) | CUTANEOUS | 2 refills | Status: AC | PRN

## 2023-02-14 NOTE — Progress Notes (Signed)
I,Jameka J Llittleton, CMA,acting as a Neurosurgeon for Gwynneth Aliment, MD.,have documented all relevant documentation on the behalf of Gwynneth Aliment, MD,as directed by  Gwynneth Aliment, MD while in the presence of Gwynneth Aliment, MD.  Subjective:    [name] , female    DOB: 1940-08-03 , 82 y.o.   MRN: [id number]  Chief Complaint  Patient presents with   Annual Exam   Hypertension    HPI  She is here today for a full physical examination.  She is no longer followed by GYN. She reports compliance with meds. Denies headaches, chest pain and shortness of breath.  Patient doesn't have any concerns or questions at this time.  Hypertension This is a chronic problem. The current episode started more than 1 year ago. The problem has been gradually improving since onset. The problem is controlled. Pertinent negatives include no blurred vision. Risk factors for coronary artery disease include obesity, post-menopausal state and sedentary lifestyle. Past treatments include angiotensin blockers and diuretics. The current treatment provides moderate improvement. Compliance problems include exercise.      Past Medical History:  Diagnosis Date   Arthritis    RA   CAD (coronary artery disease)    Cataract 06/ 2020   Cataracts in June and July 2020   Clotting disorder [facility]) 09/ 2020   Occasional hemorrhoidal bleeding   Encounter for gynecological examination (general) (routine) with abnormal findings 02/14/2023   Fibroid, uterine    Hypertension      Family History  Problem Relation Age of Onset   Heart disease Mother    Dementia Mother    Congestive Heart Failure Mother    Arthritis Mother    COPD Mother    Hypertension Mother    Parkinson's disease Father    Asthma Father    Pancreatic cancer Sister    Asthma Sister    Cancer Sister      Current Outpatient Medications:    aspirin EC 81 MG tablet, Take 81 mg by mouth daily., Disp: , Rfl:    Cholecalciferol  (VITAMIN D) 50 MCG (2000 UT) tablet, Take 2,000 Units by mouth daily. , Disp: , Rfl:    furosemide (LASIX) 20 MG tablet, TAKE 1 TABLET BY MOUTH EVERY DAY, Disp: 90 tablet, Rfl: 1   labetalol (NORMODYNE) 100 MG tablet, Take 1 tablet (100 mg total) by mouth 2 (two) times daily., Disp: 180 tablet, Rfl: 3   mometasone (NASONEX) 50 MCG/ACT nasal spray, USE 2 SPRAYS IN EACH NOSTRIL DAILY AS NEEDED FOR ALLERGIES, Disp: 51 each, Rfl: 1   polyethylene glycol (MIRALAX) 17 g packet, Take 17 g by mouth daily as needed., Disp: 30 each, Rfl: 3   hydrocortisone (ANUSOL-HC) 2.5 % rectal cream, Place 1 Application rectally 2 (two) times daily as needed for hemorrhoids or anal itching., Disp: 30 g, Rfl: 2   olmesartan-hydrochlorothiazide (BENICAR HCT) 40-25 MG tablet, Take 1 tablet by mouth daily., Disp: 90 tablet, Rfl: 1   Allergies  Allergen Reactions   Spironolactone Other (See Comments)    Hyponatremia      The patient states she uses post menopausal status for birth control. No LMP recorded. Patient is postmenopausal.. Negative for Dysmenorrhea. Negative for: breast discharge, breast lump(s), breast pain and breast self exam. Associated symptoms include abnormal vaginal bleeding. Pertinent negatives include abnormal bleeding (hematology), anxiety, decreased libido, depression, difficulty falling sleep, dyspareunia, history of infertility, nocturia, sexual dysfunction, sleep disturbances, urinary incontinence, urinary urgency, vaginal discharge  and vaginal itching. Diet regular.The patient states her exercise level is    . The patient's tobacco use is:  Social History   Tobacco Use  Smoking Status Never   Passive exposure: Past  Smokeless Tobacco Never  Tobacco Comments   I never smoked.  . She has been exposed to passive smoke. The patient's alcohol use is:  Social History   Substance and Sexual Activity  Alcohol Use No    Review of Systems  Constitutional: Negative.   HENT: Negative.    Eyes:  Negative.  Negative for blurred vision.  Respiratory: Negative.    Cardiovascular: Negative.   Gastrointestinal: Negative.   Endocrine: Negative.   Genitourinary: Negative.   Musculoskeletal: Negative.   Skin: Negative.   Allergic/Immunologic: Negative.   Neurological: Negative.   Hematological: Negative.   Psychiatric/Behavioral: Negative.       Today's Vitals   02/14/23 1007 02/14/23 1036  BP: (!) 160/94 (!) 170/98  Pulse: 67   Temp: 98.5 F (36.9 C)   Weight: 169 lb 6.4 oz (76.8 kg)   Height: 5\' 3"  (1.6 m)   PainSc: 0-No pain    Body mass index is 30.01 kg/m.  Wt Readings from Last 3 Encounters:  02/14/23 169 lb 6.4 oz (76.8 kg)  11/22/22 169 lb (76.7 kg)  07/25/22 173 lb 3.2 oz (78.6 kg)    BP Readings from Last 3 Encounters:  02/14/23 (!) 170/98  11/22/22 110/80  07/25/22 124/78     Objective:  Physical Exam Vitals and nursing note reviewed.  Constitutional:      Appearance: Normal appearance.  HENT:     Head: Normocephalic and atraumatic.     Right Ear: Tympanic membrane, ear canal and external ear normal. There is impacted cerumen.     Left Ear: Tympanic membrane, ear canal and external ear normal. There is impacted cerumen.     Nose: Nose normal.     Mouth/Throat:     Mouth: Mucous membranes are moist.     Pharynx: Oropharynx is clear.  Eyes:     Extraocular Movements: Extraocular movements intact.     Conjunctiva/sclera: Conjunctivae normal.     Pupils: Pupils are equal, round, and reactive to light.  Cardiovascular:     Rate and Rhythm: Normal rate and regular rhythm.     Pulses: Normal pulses.     Heart sounds: Normal heart sounds.  Pulmonary:     Effort: Pulmonary effort is normal.     Breath sounds: Normal breath sounds.  Abdominal:     General: Bowel sounds are normal.     Palpations: Abdomen is soft.  Genitourinary:    Comments: Deferred per patient Musculoskeletal:        General: Normal range of motion.     Cervical back: Normal  range of motion and neck supple.     Right lower leg: Edema present.     Left lower leg: Edema present.  Skin:    General: Skin is warm and dry.  Neurological:     General: No focal deficit present.     Mental Status: She is alert and oriented to person, place, and time.  Psychiatric:        Mood and Affect: Mood normal.        Behavior: Behavior normal.         Assessment And Plan:     Routine general medical examination at health care facility Assessment & Plan: A full exam was performed.  Importance of monthly  self breast exams was discussed with the patient.  She is advised to get 30-45 minutes of regular exercise, no less than four to five days per week. Both weight-bearing and aerobic exercises are recommended.  She is advised to follow a healthy diet with at least six fruits/veggies per day, decrease intake of red meat and other saturated fats and to increase fish intake to twice weekly.  Meats/fish should not be fried -- baked, boiled or broiled is preferable. It is also important to cut back on your sugar intake.  Be sure to read labels - try to avoid anything with added sugar, high fructose corn syrup or other sweeteners.  If you must use a sweetener, you can try stevia or monkfruit.  It is also important to avoid artificially sweetened foods/beverages and diet drinks. Lastly, wear SPF 50 sunscreen on exposed skin and when in direct sunlight for an extended period of time.  Be sure to avoid fast food restaurants and aim for at least 60 ounces of water daily.       Hypertensive heart disease with chronic diastolic congestive heart failure (HCC) Assessment & Plan: Chronic, terribly uncontrolled. EKG performed, Nsr w/ consider old infarct.  Importance of medication/dietary compliance was discussed with the patient.  I question recent compliance with meds. She was previously followed by Dr. Jacinto Halim, will refer her back for further evaluation.  She will continue with furosemide 20mg ,  olmesartan/hct 40/25mg  and labetalol 100mg  twice daily. She agrees to f/u in 3 weeks for NV. Will consider increasing furosemide at that time. Encouraged to follow low sodium diet.   Orders: -     EKG 12-Lead -     CBC with Differential/Platelet -     CMP14+EGFR -     Lipid panel -     TSH -     Olmesartan Medoxomil-HCTZ; Take 1 tablet by mouth daily.  Dispense: 90 tablet; Refill: 1 -     Ambulatory referral to Cardiology  Hemorrhoids, unspecified hemorrhoid type Assessment & Plan: Not examined per patient request. Refill for perianal HC cream was sent to her pharmacy.    Bilateral impacted cerumen Assessment & Plan: She declined irrigation today due to time constraints.    Estrogen deficiency Assessment & Plan: She is [facility] late February 2025. Will send order now. Encouraged to engage in weight-bearing exercises at least 3 days per week.    Other abnormal glucose Assessment & Plan: Previous labs reviewed, her A1c has been elevated in the past. I will check an A1c today. Reminded to avoid refined sugars including sugary drinks/foods and processed meats including bacon, sausages and deli meats.     Class 1 obesity due to excess calories with serious comorbidity and body mass index (BMI) of 30.0 to 30.9 in adult Assessment & Plan: She is encouraged to strive for BMI </= 29 to decrease cardiac risk. Advised to aim for at least 150 minutes of exercise per week.    Other orders -     Hydrocortisone (Perianal); Place 1 Application rectally 2 (two) times daily as needed for hemorrhoids or anal itching.  Dispense: 30 g; Refill: 2     Return in 3 weeks (on 03/07/2023), or bp check -NV, for 1 year physical, 4 month bp check. Patient was given opportunity to ask questions. Patient verbalized understanding of the plan and was able to repeat key elements of the plan. All questions were answered to their satisfaction.   I, Gwynneth Aliment,  MD, have reviewed all  documentation for this visit. The documentation on 02/14/23 for the exam, diagnosis, procedures, and orders are all accurate and complete.

## 2023-02-15 LAB — CBC WITH DIFFERENTIAL/PLATELET
Basophils Absolute: 0 10*3/uL (ref 0.0–0.2)
Basos: 1 %
EOS (ABSOLUTE): 0.4 10*3/uL (ref 0.0–0.4)
Eos: 10 %
Hematocrit: 43.8 % (ref 34.0–46.6)
Hemoglobin: 14.7 g/dL (ref 11.1–15.9)
Immature Grans (Abs): 0 10*3/uL (ref 0.0–0.1)
Immature Granulocytes: 0 %
Lymphocytes Absolute: 1.2 10*3/uL (ref 0.7–3.1)
Lymphs: 33 %
MCH: 29.5 pg (ref 26.6–33.0)
MCHC: 33.6 g/dL (ref 31.5–35.7)
MCV: 88 fL (ref 79–97)
Monocytes Absolute: 0.3 10*3/uL (ref 0.1–0.9)
Monocytes: 9 %
Neutrophils Absolute: 1.6 10*3/uL (ref 1.4–7.0)
Neutrophils: 47 %
Platelets: 186 10*3/uL (ref 150–450)
RBC: 4.99 x10E6/uL (ref 3.77–5.28)
RDW: 13.1 % (ref 11.7–15.4)
WBC: 3.5 10*3/uL (ref 3.4–10.8)

## 2023-02-15 LAB — CMP14+EGFR
ALT: 15 [IU]/L (ref 0–32)
AST: 25 [IU]/L (ref 0–40)
Albumin: 3.5 g/dL — ABNORMAL LOW (ref 3.7–4.7)
Alkaline Phosphatase: 66 [IU]/L (ref 44–121)
BUN/Creatinine Ratio: 9 — ABNORMAL LOW (ref 12–28)
BUN: 7 mg/dL — ABNORMAL LOW (ref 8–27)
Bilirubin Total: 0.7 mg/dL (ref 0.0–1.2)
CO2: 28 mmol/L (ref 20–29)
Calcium: 9.3 mg/dL (ref 8.7–10.3)
Chloride: 98 mmol/L (ref 96–106)
Creatinine, Ser: 0.77 mg/dL (ref 0.57–1.00)
Globulin, Total: 2 g/dL (ref 1.5–4.5)
Glucose: 87 mg/dL (ref 70–99)
Potassium: 4 mmol/L (ref 3.5–5.2)
Sodium: 135 mmol/L (ref 134–144)
Total Protein: 5.5 g/dL — ABNORMAL LOW (ref 6.0–8.5)
eGFR: 77 mL/min/{1.73_m2} (ref >59)

## 2023-02-15 LAB — LIPID PANEL
Chol/HDL Ratio: 2.2 ratio (ref 0.0–4.4)
Cholesterol, Total: 194 mg/dL (ref 100–199)
HDL: 90 mg/dL (ref >39)
LDL Chol Calc (NIH): 94 mg/dL (ref 0–99)
Triglycerides: 52 mg/dL (ref 0–149)
VLDL Cholesterol Cal: 10 mg/dL (ref 5–40)

## 2023-02-15 LAB — TSH: TSH: 2.43 u[IU]/mL (ref 0.450–4.500)

## 2023-02-20 NOTE — Assessment & Plan Note (Signed)
Previous labs reviewed, her A1c has been elevated in the past. I will check an A1c today. Reminded to avoid refined sugars including sugary drinks/foods and processed meats including bacon, sausages and deli meats.  

## 2023-02-20 NOTE — Assessment & Plan Note (Signed)

## 2023-02-20 NOTE — Assessment & Plan Note (Signed)
She declined irrigation today due to time constraints.

## 2023-02-20 NOTE — Assessment & Plan Note (Signed)
She is due for dexa scan at [facility] late February 2025. Will send order now. Encouraged to engage in weight-bearing exercises at least 3 days per week.

## 2023-02-20 NOTE — Assessment & Plan Note (Signed)
Chronic, terribly uncontrolled. EKG performed, Nsr w/ consider old infarct.  Importance of medication/dietary compliance was discussed with the patient.  I question recent compliance with meds. She was previously followed by Dr. Jacinto Halim, will refer her back for further evaluation.  She will continue with furosemide 20mg , olmesartan/hct 40/25mg  and labetalol 100mg  twice daily. She agrees to f/u in 3 weeks for NV. Will consider increasing furosemide at that time. Encouraged to follow low sodium diet.

## 2023-02-20 NOTE — Assessment & Plan Note (Signed)
Not examined per patient request. Refill for perianal HC cream was sent to her pharmacy.

## 2023-02-20 NOTE — Assessment & Plan Note (Signed)
She is encouraged to strive for BMI </= 29 to decrease cardiac risk. Advised to aim for at least 150 minutes of exercise per week.

## 2023-03-07 ENCOUNTER — Ambulatory Visit: Payer: Medicare PPO

## 2023-03-07 VITALS — BP 120/70 | HR 76 | Ht 63.0 in | Wt 169.0 lb

## 2023-03-07 DIAGNOSIS — I110 Hypertensive heart disease with heart failure: Secondary | ICD-10-CM

## 2023-03-07 NOTE — Progress Notes (Signed)
Patient presents today for bpc. Patient takes olmesartan hydrochlorothiazide 40-25mg  ans labetalol 100mg  in the mornings and labetalol 100mg  in the evenings. I checked her blood pressure and it was 160/90 P77. I cleaned patient's ears and she now free of wax. After about 15 minutes I checked her blood pressure and it was 120/70 P76.     BP Readings from Last 3 Encounters:  02/14/23 (!) 170/98  11/22/22 110/80  07/25/22 124/78

## 2023-05-03 ENCOUNTER — Encounter: Payer: Self-pay | Admitting: Cardiology

## 2023-05-03 ENCOUNTER — Ambulatory Visit: Payer: Medicare PPO | Attending: Cardiology | Admitting: Cardiology

## 2023-05-03 VITALS — BP 148/86 | HR 71 | Resp 16 | Ht 65.0 in | Wt 168.6 lb

## 2023-05-03 DIAGNOSIS — I110 Hypertensive heart disease with heart failure: Secondary | ICD-10-CM

## 2023-05-03 DIAGNOSIS — I10 Essential (primary) hypertension: Secondary | ICD-10-CM

## 2023-05-03 DIAGNOSIS — I5032 Chronic diastolic (congestive) heart failure: Secondary | ICD-10-CM

## 2023-05-03 DIAGNOSIS — R600 Localized edema: Secondary | ICD-10-CM

## 2023-05-03 NOTE — Patient Instructions (Signed)

## 2023-05-03 NOTE — Progress Notes (Signed)
 Cardiology Office Note:  .   Date:  05/03/2023  ID:  Tina  JENNETTE Ware, DOB 03/14/1941, MRN [id number] PCP: Jarold Medici, MD  [facility] Providers Cardiologist:  Gordy Bergamo, MD   History of Present Illness: .   Tina  LELON Ware is a 83 y.o. female with PMH significant for hypertension, prediabetes, hypercholesterolemia, rheumatoid arthritis, difficult to control hypertension and leg edema presents here for follow-up.  She remains asymptomatic.  Discussed the use of AI scribe software for clinical note transcription with the patient, who gave verbal consent to proceed.  History of Present Illness   Tina  LELON Ware is an 83 year old female with hypertension and diastolic heart failure who presents for routine follow-up.  Hypertension is well-controlled with her current medication regimen. Blood pressure readings are typically in the 120s, with occasional readings as low as 114/70 mmHg. She attributes some of this control to dietary modifications, including the addition of garlic. No symptoms of dizziness or lightheadedness, even when blood pressure is on the lower end. She has been taking only half of the olmesartan  HCT tablet due to previous episodes of dizziness with the full dose.  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 This work keeps her mentally engaged and physically active.  She experiences mild leg swelling, which she manages with occasional use of furosemide  and monitoring her salt intake.      Labs   Lab Results  Component Value Date   CHOL 194 02/14/2023   HDL 90 02/14/2023   LDLCALC 94 02/14/2023   TRIG 52 02/14/2023   CHOLHDL 2.2 02/14/2023   Lab Results  Component Value Date   NA 135 02/14/2023   K 4.0 02/14/2023   CO2 28 02/14/2023   GLUCOSE 87 02/14/2023   BUN 7 (L) 02/14/2023    CREATININE 0.77 02/14/2023   CALCIUM  9.3 02/14/2023   EGFR 77 02/14/2023   GFRNONAA 62 01/01/2020      Latest Ref Rng & Units 02/14/2023   11:12 AM 11/22/2022   10:01 AM 07/25/2022    9:47 AM  BMP  Glucose 70 - 99 mg/dL 87  97  99   BUN 8 - 27 mg/dL 7  13  8    Creatinine 0.57 - 1.00 mg/dL 9.22  9.21  9.23   BUN/Creat Ratio 12 - 28 9  17  11    Sodium 134 - 144 mmol/L 135  135  139   Potassium 3.5 - 5.2 mmol/L 4.0  3.9  4.1   Chloride 96 - 106 mmol/L 98  98  98   CO2 20 - 29 mmol/L 28  25  25    Calcium  8.7 - 10.3 mg/dL 9.3  9.3  9.5       Latest Ref Rng & Units 02/14/2023   11:12 AM 11/15/2021    8:35 AM 01/06/2021   12:06 PM  CBC  WBC 3.4 - 10.8 x10E3/uL 3.5  3.9  4.1   Hemoglobin 11.1 - 15.9 g/dL 85.2  84.8  85.2   Hematocrit 34.0 - 46.6 % 43.8  43.8  42.7   Platelets 150 - 450 x10E3/uL 186  186  200    Review of Systems  Cardiovascular:  Negative for chest pain, dyspnea on exertion and leg swelling.    Physical Exam:   VS:  BP (!) 148/86 (BP Location: Left  Arm, Patient Position: Sitting, Cuff Size: Normal)   Pulse 71   Resp 16   Ht 5' 5 (1.651 m)   Wt 168 lb 9.6 oz (76.5 kg)   SpO2 96%   BMI 28.06 kg/m    Wt Readings from Last 3 Encounters:  05/03/23 168 lb 9.6 oz (76.5 kg)  03/07/23 169 lb (76.7 kg)  02/14/23 169 lb 6.4 oz (76.8 kg)    Physical Exam Neck:     Vascular: No carotid bruit or JVD.  Cardiovascular:     Rate and Rhythm: Normal rate and regular rhythm.     Pulses: Intact distal pulses.     Heart sounds: Normal heart sounds. No murmur heard.    No gallop.  Pulmonary:     Effort: Pulmonary effort is normal.     Breath sounds: Normal breath sounds.  Abdominal:     General: Bowel sounds are normal.     Palpations: Abdomen is soft.  Musculoskeletal:     Right lower leg: No edema.     Left lower leg: No edema.   Large adipose tissue noted in bilateral lower extremity ankles and legs.  Studies Reviewed: Tina Ware    ECHOCARDIOGRAM COMPLETE 06/04/2019   1. Left ventricular ejection fraction, by estimation, is 60 to 65%. The left ventricle has normal function. The left ventricle has no regional wall motion abnormalities. Left ventricular diastolic parameters are consistent with Grade II diastolic dysfunction (pseudonormalization). 2. Right ventricular systolic function is normal. The right ventricular size is normal. There is normal pulmonary artery systolic pressure. 3. Left atrial size was mildly dilated. 4. Anterior fat pad noted in the pericardium. 5. The aortic valve is normal in structure. Aortic valve regurgitation is not visualized. Mild aortic valve sclerosis is present, with no evidence of aortic valve stenosis.  EKG:   EKG 03/12/2021: Normal sinus rhythm at rate of 82 bpm, left axis deviation, left anterior fascicular block.  Poor R wave progression, probably normal variant.  Early R in V2 probably lead placement.   Medications and allergies    Allergies  Allergen Reactions   Spironolactone  Other (See Comments)    Hyponatremia     Current Outpatient Medications:    aspirin  EC 81 MG tablet, Take 81 mg by mouth daily., Disp: , Rfl:    Cholecalciferol  (VITAMIN D) 50 MCG (2000 UT) tablet, Take 2,000 Units by mouth daily. , Disp: , Rfl:    furosemide  (LASIX ) 20 MG tablet, TAKE 1 TABLET BY MOUTH EVERY DAY (Patient taking differently: Take 20 mg by mouth daily as needed for fluid.), Disp: 90 tablet, Rfl: 1   hydrocortisone  (ANUSOL -HC) 2.5 % rectal cream, Place 1 Application rectally 2 (two) times daily as needed for hemorrhoids or anal itching., Disp: 30 g, Rfl: 2   labetalol  (NORMODYNE ) 100 MG tablet, Take 1 tablet (100 mg total) by mouth 2 (two) times daily., Disp: 180 tablet, Rfl: 3   mometasone  (NASONEX ) 50 MCG/ACT nasal spray, USE 2 SPRAYS IN EACH NOSTRIL DAILY AS NEEDED FOR ALLERGIES, Disp: 51 each, Rfl: 1   olmesartan -hydrochlorothiazide (BENICAR  HCT) 40-25 MG tablet, Take 1 tablet by mouth daily. (Patient taking differently:  Take 0.5 tablets by mouth daily.), Disp: 90 tablet, Rfl: 1   polyethylene glycol (MIRALAX ) 17 g packet, Take 17 g by mouth daily as needed., Disp: 30 each, Rfl: 3   ASSESSMENT AND PLAN: .      ICD-10-CM   1. Bilateral leg edema  R60.0     2. Essential hypertension  I10     3. Hypertensive heart disease with chronic diastolic congestive heart failure (HCC)  I11.0    I50.32       Assessment and Plan    Diastolic Heart Failure Diastolic heart failure is well-managed with no symptoms reported in the last 2-3 years and no recent hospitalizations. 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 - Continue current medications - Encourage regular physical activity and dietary modifications  Hypertension Hypertension is well-controlled with current medications. Home blood pressure readings are generally within the normal range (114-121 mmHg systolic). Today's reading was elevated but inconsistent with usual readings.  She takes labetalol  100 mg twice a day and half of an olmesartan  HCT 40/25 mg tablet once a day due to previous dizziness with the full dose. Discussed prescribing a lower dose tablet, but she prefers to continue cutting the current tablet.  - Continue labetalol  100 mg twice a day - Continue taking olmesartan  HCT 40/25 mg 1/2 tablet daily   - Monitor blood pressure at home - Take a full tablet of olmesartan  HCT if blood pressure exceeds 140/90 mmHg -Reviewed labs, normal renal function, normal lipids.  Her last echocardiogram 2021 revealed grade 2 diastolic dysfunction, as she is remains asymptomatic, no indication for repeating the echocardiogram.  Peripheral Edema Mild leg swelling managed with furosemide  as needed. No significant changes in swelling noted. She takes furosemide  once or twice a week depending on fluid retention. Advised to monitor salt intake and use furosemide  after consuming salty foods if swelling increases. - Continue furosemide  as  needed - Advise to watch salt intake - Take furosemide  if increased swelling is noted, especially after consuming salty foods  General Health Maintenance Inquired about taking magnesium  supplements to improve muscle strength. No contraindications noted with current medications. - Approve magnesium  supplementation - Encourage continued physical activity and balanced diet  Follow-up - Follow-up with Dr. Jarold for further management and prescription refills. - Return to cardiology if new symptoms arise or if advised by Dr. Jarold.     Signed,  Gordy Bergamo, MD, [facility] 05/03/2023, 10:32 AM [facility] [address] [place], [place] [address] Phone: [phone]. Fax:  [phone]

## 2023-05-10 DIAGNOSIS — M069 Rheumatoid arthritis, unspecified: Secondary | ICD-10-CM | POA: Diagnosis not present

## 2023-05-10 DIAGNOSIS — E2839 Other primary ovarian failure: Secondary | ICD-10-CM | POA: Diagnosis not present

## 2023-05-10 DIAGNOSIS — Z1231 Encounter for screening mammogram for malignant neoplasm of breast: Secondary | ICD-10-CM | POA: Diagnosis not present

## 2023-05-10 DIAGNOSIS — M8588 Other specified disorders of bone density and structure, other site: Secondary | ICD-10-CM | POA: Diagnosis not present

## 2023-05-10 LAB — HM DEXA SCAN

## 2023-05-10 LAB — HM MAMMOGRAPHY

## 2023-05-12 ENCOUNTER — Encounter: Payer: Self-pay | Admitting: Internal Medicine

## 2023-05-30 ENCOUNTER — Other Ambulatory Visit: Payer: Self-pay | Admitting: Internal Medicine

## 2023-05-30 DIAGNOSIS — M810 Age-related osteoporosis without current pathological fracture: Secondary | ICD-10-CM

## 2023-05-30 DIAGNOSIS — E871 Hypo-osmolality and hyponatremia: Secondary | ICD-10-CM

## 2023-06-01 ENCOUNTER — Ambulatory Visit: Payer: Self-pay | Admitting: Internal Medicine

## 2023-06-01 ENCOUNTER — Encounter: Payer: Self-pay | Admitting: Internal Medicine

## 2023-06-01 VITALS — BP 138/90 | HR 70 | Temp 98.0°F | Ht 65.0 in | Wt 172.8 lb

## 2023-06-01 DIAGNOSIS — I5032 Chronic diastolic (congestive) heart failure: Secondary | ICD-10-CM | POA: Diagnosis not present

## 2023-06-01 DIAGNOSIS — R779 Abnormality of plasma protein, unspecified: Secondary | ICD-10-CM | POA: Diagnosis not present

## 2023-06-01 DIAGNOSIS — M816 Localized osteoporosis [Lequesne]: Secondary | ICD-10-CM

## 2023-06-01 DIAGNOSIS — R7309 Other abnormal glucose: Secondary | ICD-10-CM

## 2023-06-01 DIAGNOSIS — I110 Hypertensive heart disease with heart failure: Secondary | ICD-10-CM | POA: Diagnosis not present

## 2023-06-01 DIAGNOSIS — M25532 Pain in left wrist: Secondary | ICD-10-CM

## 2023-06-01 DIAGNOSIS — J301 Allergic rhinitis due to pollen: Secondary | ICD-10-CM | POA: Diagnosis not present

## 2023-06-01 DIAGNOSIS — R770 Abnormality of albumin: Secondary | ICD-10-CM | POA: Diagnosis not present

## 2023-06-01 DIAGNOSIS — E2839 Other primary ovarian failure: Secondary | ICD-10-CM

## 2023-06-01 MED ORDER — MOMETASONE FUROATE 50 MCG/ACT NA SUSP
2.0000 | Freq: Every day | NASAL | 1 refills | Status: DC
Start: 2023-06-01 — End: 2023-12-04

## 2023-06-01 NOTE — Patient Instructions (Signed)
 Hypertension, Adult Hypertension is another name for high blood pressure. High blood pressure forces your heart to work harder to pump blood. This can cause problems over time. There are two numbers in a blood pressure reading. There is a top number (systolic) over a bottom number (diastolic). It is best to have a blood pressure that is below 120/80. What are the causes? The cause of this condition is not known. Some other conditions can lead to high blood pressure. What increases the risk? Some lifestyle factors can make you more likely to develop high blood pressure: Smoking. Not getting enough exercise or physical activity. Being overweight. Having too much fat, sugar, calories, or salt (sodium) in your diet. Drinking too much alcohol. Other risk factors include: Having any of these conditions: Heart disease. Diabetes. High cholesterol. Kidney disease. Obstructive sleep apnea. Having a family history of high blood pressure and high cholesterol. Age. The risk increases with age. Stress. What are the signs or symptoms? High blood pressure may not cause symptoms. Very high blood pressure (hypertensive crisis) may cause: Headache. Fast or uneven heartbeats (palpitations). Shortness of breath. Nosebleed. Vomiting or feeling like you may vomit (nauseous). Changes in how you see. Very bad chest pain. Feeling dizzy. Seizures. How is this treated? This condition is treated by making healthy lifestyle changes, such as: Eating healthy foods. Exercising more. Drinking less alcohol. Your doctor may prescribe medicine if lifestyle changes do not help enough and if: Your top number is above 130. Your bottom number is above 80. Your personal target blood pressure may vary. Follow these instructions at home: Eating and drinking  If told, follow the DASH eating plan. To follow this plan: Fill one half of your plate at each meal with fruits and vegetables. Fill one fourth of your plate  at each meal with whole grains. Whole grains include whole-wheat pasta, brown rice, and whole-grain bread. Eat or drink low-fat dairy products, such as skim milk or low-fat yogurt. Fill one fourth of your plate at each meal with low-fat (lean) proteins. Low-fat proteins include fish, chicken without skin, eggs, beans, and tofu. Avoid fatty meat, cured and processed meat, or chicken with skin. Avoid pre-made or processed food. Limit the amount of salt in your diet to less than 1,500 mg each day. Do not drink alcohol if: Your doctor tells you not to drink. You are pregnant, may be pregnant, or are planning to become pregnant. If you drink alcohol: Limit how much you have to: 0-1 drink a day for women. 0-2 drinks a day for men. Know how much alcohol is in your drink. In the [place] one drink equals one 12 oz bottle of beer (355 mL), one 5 oz glass of wine (148 mL), or one 1 oz glass of hard liquor (44 mL). Lifestyle  Work with your doctor to stay at a healthy weight or to lose weight. Ask your doctor what the best weight is for you. Get at least 30 minutes of exercise that causes your heart to beat faster (aerobic exercise) most days of the week. This may include walking, swimming, or biking. Get at least 30 minutes of exercise that strengthens your muscles (resistance exercise) at least 3 days a week. This may include lifting weights or doing Pilates. Do not smoke or use any products that contain nicotine or tobacco. If you need help quitting, ask your doctor. Check your blood pressure at home as told by your doctor. Keep all follow-up visits. Medicines Take over-the-counter and prescription medicines  only as told by your doctor. Follow directions carefully. Do not skip doses of blood pressure medicine. The medicine does not work as well if you skip doses. Skipping doses also puts you at risk for problems. Ask your doctor about side effects or reactions to medicines that you should watch  for. Contact a doctor if: You think you are having a reaction to the medicine you are taking. You have headaches that keep coming back. You feel dizzy. You have swelling in your ankles. You have trouble with your vision. Get help right away if: You get a very bad headache. You start to feel mixed up (confused). You feel weak or numb. You feel faint. You have very bad pain in your: Chest. Belly (abdomen). You vomit more than once. You have trouble breathing. These symptoms may be an emergency. Get help right away. Call 911. Do not wait to see if the symptoms will go away. Do not drive yourself to the hospital. Summary Hypertension is another name for high blood pressure. High blood pressure forces your heart to work harder to pump blood. For most people, a normal blood pressure is less than 120/80. Making healthy choices can help lower blood pressure. If your blood pressure does not get lower with healthy choices, you may need to take medicine. This information is not intended to replace advice given to you by your health care provider. Make sure you discuss any questions you have with your health care provider. Document Revised: 12/31/2020 Document Reviewed: 12/31/2020 Elsevier Patient Education  2024 [employer].

## 2023-06-01 NOTE — Progress Notes (Signed)
 I,Tina Ware, CMA,acting as a Neurosurgeon for Tina Aliment, MD.,have documented all relevant documentation on the behalf of Tina Aliment, MD,as directed by  Tina Aliment, MD while in the presence of Tina Aliment, MD.  Subjective:  [name] , female    DOB: 1941-03-04 , 83 y.o.   MRN: [id number]  Chief Complaint  Patient presents with   Hypertension    HPI  She presents today for HTN f/u. She reports compliance with meds. She denies headaches, chest pain and shortness of breath.  She has no specific concerns at this time.    Hypertension This is a chronic problem. The current episode started more than 1 year ago. The problem has been gradually improving since onset. The problem is controlled. Pertinent negatives include no blurred vision, palpitations or shortness of breath. Risk factors for coronary artery disease include sedentary lifestyle, post-menopausal state and obesity. The current treatment provides moderate improvement. Compliance problems include exercise.      Past Medical History:  Diagnosis Date   Arthritis    RA   CAD (coronary artery disease)    Cataract 06/ 2020   Cataracts in June and July 2020   Clotting disorder [facility]) 09/ 2020   Occasional hemorrhoidal bleeding   Encounter for gynecological examination (general) (routine) with abnormal findings 02/14/2023   Fibroid, uterine    Hypertension    Localized osteoporosis without current pathological fracture 06/10/2023     Family History  Problem Relation Age of Onset   Heart disease Mother    Dementia Mother    Congestive Heart Failure Mother    Arthritis Mother    COPD Mother    Hypertension Mother    Parkinson's disease Father    Asthma Father    Pancreatic cancer Sister    Asthma Sister    Cancer Sister      Current Outpatient Medications:    aspirin EC 81 MG tablet, Take 81 mg by mouth daily., Disp: , Rfl:    Cholecalciferol (VITAMIN D) 50 MCG (2000 UT) tablet,  Take 2,000 Units by mouth daily. , Disp: , Rfl:    furosemide (LASIX) 20 MG tablet, Take 1 tablet (20 mg total) by mouth daily as needed for fluid., Disp: 90 tablet, Rfl: 2   hydrocortisone (ANUSOL-HC) 2.5 % rectal cream, Place 1 Application rectally 2 (two) times daily as needed for hemorrhoids or anal itching., Disp: 30 g, Rfl: 2   labetalol (NORMODYNE) 100 MG tablet, Take 1 tablet (100 mg total) by mouth 2 (two) times daily., Disp: 180 tablet, Rfl: 3   olmesartan-hydrochlorothiazide (BENICAR HCT) 40-25 MG tablet, Take 1 tablet by mouth daily. (Patient taking differently: Take 0.5 tablets by mouth daily.), Disp: 90 tablet, Rfl: 1   polyethylene glycol (MIRALAX) 17 g packet, Take 17 g by mouth daily as needed., Disp: 30 each, Rfl: 3   mometasone (NASONEX) 50 MCG/ACT nasal spray, Place 2 sprays into the nose daily., Disp: 51 each, Rfl: 1   Allergies  Allergen Reactions   Spironolactone Other (See Comments)    Hyponatremia     Review of Systems  Constitutional: Negative.   Eyes:  Negative for blurred vision.  Respiratory: Negative.  Negative for shortness of breath.   Cardiovascular: Negative.  Negative for palpitations.  Gastrointestinal: Negative.   Musculoskeletal:  Positive for arthralgias.  Neurological: Negative.   Psychiatric/Behavioral: Negative.       Today's Vitals   06/01/23 1159 06/01/23 1216  BP: Marland Kitchen)  144/90 (!) 138/90  Pulse: 70   Temp: 98 F (36.7 C)   Weight: 172 lb 12.8 oz (78.4 kg)   Height: 5\' 5"  (1.651 m)    Body mass index is 28.76 kg/m.  Wt Readings from Last 3 Encounters:  06/01/23 172 lb 12.8 oz (78.4 kg)  05/03/23 168 lb 9.6 oz (76.5 kg)  03/07/23 169 lb (76.7 kg)     Objective:  Physical Exam Vitals and nursing note reviewed.  Constitutional:      Appearance: Normal appearance.  HENT:     Head: Normocephalic and atraumatic.  Eyes:     Extraocular Movements: Extraocular movements intact.  Cardiovascular:     Rate and Rhythm: Normal rate and  regular rhythm.     Heart sounds: Normal heart sounds.  Pulmonary:     Effort: Pulmonary effort is normal.     Breath sounds: Normal breath sounds.  Musculoskeletal:     Cervical back: Normal range of motion.     Right lower leg: Edema present.     Left lower leg: Edema present.  Skin:    General: Skin is warm.  Neurological:     General: No focal deficit present.     Mental Status: She is alert.  Psychiatric:        Mood and Affect: Mood normal.        Behavior: Behavior normal.         Assessment And Plan:  Hypertensive heart disease with chronic diastolic congestive heart failure (HCC) Assessment & Plan: Chronic, fair control.  Goal BP<130/80.  Importance of medication/dietary compliance was discussed with the patient.  I question recent compliance with meds. She will continue with furosemide 20mg , olmesartan/hct 40/25mg  and labetalol 100mg  twice daily. She is encouraged to follow a low sodium diet. She will rto in four to six months for re-evaluation.    Orders: -     CMP14+EGFR  Localized osteoporosis without current pathological fracture Assessment & Plan: Most recent bone density results reviewed. She prefers to see Rheumatology to determine treatment plan. She is encouraged to engage in weight-bearing exercises at least three days weekly.    Left wrist pain Assessment & Plan: She has upcoming appt with Ortho. Advised to wear sleeve until this appt. She may apply Voltaren gel to affected area twice daily prn.    Seasonal allergic rhinitis due to pollen Assessment & Plan: Chronic, she has intermittent sx. I will send rx Nasonex NS as requested. She will use as needed.   Orders: -     Mometasone Furoate; Place 2 sprays into the nose daily.  Dispense: 51 each; Refill: 1  Other abnormal glucose Assessment & Plan: Previous labs reviewed, her A1c has been elevated in the past. I will check an A1c today. Reminded to avoid refined sugars including sugary drinks/foods  and processed meats including bacon, sausages and deli meats.     Low serum albumin -     Prealbumin -     Specimen status report     Return for 4 MONTH bpc. .  Patient was given opportunity to ask questions. Patient verbalized understanding of the plan and was able to repeat key elements of the plan. All questions were answered to their satisfaction.    I, Dorothyann Peng, have reviewed all documentation for this visit. The documentation on 06/10/23 for the exam, diagnosis, procedures, and orders are all accurate and complete.   IF YOU HAVE BEEN REFERRED TO A SPECIALIST, IT MAY TAKE 1-2 WEEKS  TO SCHEDULE/PROCESS THE REFERRAL. IF YOU HAVE NOT HEARD FROM US/SPECIALIST IN TWO WEEKS, PLEASE GIVE [place] A CALL AT [phone].   THE PATIENT IS ENCOURAGED TO PRACTICE SOCIAL DISTANCING DUE TO THE COVID-19 PANDEMIC.

## 2023-06-02 LAB — CMP14+EGFR
ALT: 13 IU/L (ref 0–32)
AST: 25 IU/L (ref 0–40)
Albumin: 3.5 g/dL — ABNORMAL LOW (ref 3.7–4.7)
Alkaline Phosphatase: 61 IU/L (ref 44–121)
BUN/Creatinine Ratio: 10 — ABNORMAL LOW (ref 12–28)
BUN: 7 mg/dL — ABNORMAL LOW (ref 8–27)
Bilirubin Total: 0.6 mg/dL (ref 0.0–1.2)
CO2: 25 mmol/L (ref 20–29)
Calcium: 9.1 mg/dL (ref 8.7–10.3)
Chloride: 98 mmol/L (ref 96–106)
Creatinine, Ser: 0.71 mg/dL (ref 0.57–1.00)
Globulin, Total: 1.7 g/dL (ref 1.5–4.5)
Glucose: 78 mg/dL (ref 70–99)
Potassium: 3.7 mmol/L (ref 3.5–5.2)
Sodium: 136 mmol/L (ref 134–144)
Total Protein: 5.2 g/dL — ABNORMAL LOW (ref 6.0–8.5)
eGFR: 85 mL/min/{1.73_m2} (ref >59)

## 2023-06-07 ENCOUNTER — Ambulatory Visit: Admitting: Physician Assistant

## 2023-06-07 LAB — SPECIMEN STATUS REPORT

## 2023-06-07 LAB — PREALBUMIN: PREALBUMIN: 16 mg/dL (ref 9–32)

## 2023-06-07 NOTE — Progress Notes (Unsigned)
 Office Visit Note  Patient: Tina Ware             Date of Birth: Jan 01, 1941           MRN: [id number]             PCP: Dorothyann Peng, MD Referring: Dorothyann Peng, MD Visit Date: 06/21/2023 Occupation: @GUAROCC @  Subjective:  Pain in both shoulders   History of Present Illness: Tina Ware is a 83 y.o. female with history of osteoarthritis.  Patient was last seen in the office on 11/15/21.  Patient presents today with bilateral shoulder pain x 2 weeks.  Patient states that she thinks she may have lift something too heavy and has had discomfort since then.  Patient states the pain is most severe in the left shoulder but she also has discomfort in the right shoulder.  Patient denies any neck pain.  She denies any discomfort in her elbows.  She has had some soreness in both wrists with palpation.  Patient has not been taking any over-the-counter products for pain relief.  She is considering natural anti-inflammatories including curcumin.   Patient had an updated bone density in February 2025.  Dr. Allyne Gee recommended initiating Fosamax but she is apprehensive due to possible side effects.   Activities of Daily Living:  Patient reports morning stiffness for 30 or less minutes.   Patient Reports nocturnal pain.  Difficulty dressing/grooming: Denies Difficulty climbing stairs: Denies Difficulty getting out of chair: Denies Difficulty using hands for taps, buttons, cutlery, and/or writing: Reports  Review of Systems  Constitutional:  Negative for fatigue.  HENT:  Positive for mouth dryness. Negative for mouth sores.   Eyes:  Negative for dryness.  Respiratory:  Negative for shortness of breath.   Cardiovascular:  Negative for chest pain and palpitations.  Gastrointestinal:  Positive for blood in stool, constipation and diarrhea.  Endocrine: Negative for increased urination.  Genitourinary:  Negative for involuntary urination.  Musculoskeletal:  Positive for joint swelling,  myalgias, muscle weakness, morning stiffness, muscle tenderness and myalgias. Negative for joint pain, gait problem and joint pain.  Skin:  Negative for color change, rash, hair loss and sensitivity to sunlight.  Allergic/Immunologic: Negative for susceptible to infections.  Neurological:  Negative for dizziness and headaches.  Hematological:  Negative for swollen glands.  Psychiatric/Behavioral:  Negative for depressed mood and sleep disturbance. The patient is nervous/anxious.     PMFS History:  Patient Active Problem List   Diagnosis Date Noted   Left wrist pain 06/10/2023   Seasonal allergic rhinitis due to pollen 06/10/2023   Localized osteoporosis without current pathological fracture 06/10/2023   Encounter for gynecological examination (general) (routine) with abnormal findings 02/14/2023   Hemorrhoids 02/14/2023   Estrogen deficiency 02/14/2023   Overweight with body mass index (BMI) of 29 to 29.9 in adult 12/04/2022   Exposure to meningitis 12/04/2022   Hypertensive heart disease with chronic diastolic congestive heart failure (HCC) 01/16/2021   Bilateral impacted cerumen 01/16/2021   Other abnormal glucose 01/16/2021   Class 1 obesity due to excess calories with serious comorbidity and body mass index (BMI) of 30.0 to 30.9 in adult 01/16/2021   Chronic diastolic (congestive) heart failure (HCC) 01/06/2021   Syncope 06/04/2019   Scalp hematoma 06/04/2019   Blood in stool 06/04/2019   Status post trigger finger release 01/24/2019   Carpal tunnel syndrome, right upper limb 12/27/2018   Bilateral leg edema 02/12/2018   Essential hypertension 02/12/2018   Chronic pain of right  knee 02/12/2018   Hyponatremia 05/03/2017   Abdominal pain 05/03/2017    Past Medical History:  Diagnosis Date   Arthritis    RA   CAD (coronary artery disease)    Cataract 06/ 2020   Cataracts in June and July 2020   Clotting disorder [facility]) 09/ 2020   Occasional hemorrhoidal bleeding    Encounter for gynecological examination (general) (routine) with abnormal findings 02/14/2023   Fibroid, uterine    Hypertension    Localized osteoporosis without current pathological fracture 06/10/2023    Family History  Problem Relation Age of Onset   Heart disease Mother    Dementia Mother    Congestive Heart Failure Mother    Arthritis Mother    COPD Mother    Hypertension Mother    Parkinson's disease Father    Asthma Father    Pancreatic cancer Sister    Asthma Sister    Cancer Sister    Past Surgical History:  Procedure Laterality Date   CARPAL TUNNEL RELEASE Right 12/20/2018   CATARACT EXTRACTION, BILATERAL     CHOLECYSTECTOMY     EYE SURGERY     08/2018 and 072020   TRIGGER FINGER RELEASE Right 12/20/2018   TUBAL LIGATION  3/25 /1985   Social History   Social History Narrative   Right handed   Lives in a one story home    Drinks tea    Immunization History  Administered Date(s) Administered   Fluad Quad(high Dose 65+) 01/01/2020, 01/06/2021, 12/25/2021   Influenza, High Dose Seasonal PF 01/15/2018, 01/09/2019, 01/21/2023   Moderna Covid-19 Fall Seasonal Vaccine 75yrs & older 01/21/2023   Moderna Covid-19 Vaccine Bivalent Booster 31yrs & up 02/08/2021, 12/25/2021   Moderna SARS-COV2 Booster Vaccination 01/24/2020, 08/25/2020   Moderna Sars-Covid-2 Vaccination 04/21/2019, 05/19/2019, 02/08/2021, 12/18/2021   Pneumococcal Conjugate-13 12/13/2018   Pneumococcal Polysaccharide-23 07/21/2021   Tdap 07/06/2017   Zoster Recombinant(Shingrix) 11/09/2018, 02/28/2019, 03/26/2019     Objective: Vital Signs: BP (!) 141/78 (BP Location: Right Arm, Patient Position: Sitting, Cuff Size: Large)   Pulse 66   Resp 14   Ht 5\' 3"  (1.6 m)   Wt 171 lb (77.6 kg)   BMI 30.29 kg/m    Physical Exam Vitals and nursing note reviewed.  Constitutional:      Appearance: She is well-developed.  HENT:     Head: Normocephalic and atraumatic.  Eyes:     Conjunctiva/sclera:  Conjunctivae normal.  Cardiovascular:     Rate and Rhythm: Normal rate and regular rhythm.     Heart sounds: Normal heart sounds.  Pulmonary:     Effort: Pulmonary effort is normal.     Breath sounds: Normal breath sounds.  Abdominal:     General: Bowel sounds are normal.     Palpations: Abdomen is soft.  Musculoskeletal:     Cervical back: Normal range of motion.  Lymphadenopathy:     Cervical: No cervical adenopathy.  Skin:    General: Skin is warm and dry.     Capillary Refill: Capillary refill takes less than 2 seconds.  Neurological:     Mental Status: She is alert and oriented to person, place, and time.  Psychiatric:        Behavior: Behavior normal.      Musculoskeletal Exam: C-spine, thoracic spine, and lumbar spine good ROM.  Some discomfort with internal rotation of both shoulders, left greater than right.  Joints have good range of motion with no tenderness along the joint line.  Tenderness of  both wrist joints.  Limited extension of the left third PIP joint.  Hip joints have good range of motion with no groin pain.  Knee joints have good range of motion no warmth or effusion.  Ankle joints have good range of motion with no joint tenderness.  Pedal edema noted bilaterally.  CDAI Exam: CDAI Score: -- Patient Global: --; Provider Global: -- Swollen: --; Tender: -- Joint Exam 06/21/2023   No joint exam has been documented for this visit   There is currently no information documented on the homunculus. Go to the Rheumatology activity and complete the homunculus joint exam.  Investigation: No additional findings.  Imaging: No results found.  Recent Labs: Lab Results  Component Value Date   WBC 3.5 02/14/2023   HGB 14.7 02/14/2023   PLT 186 02/14/2023   NA 136 06/01/2023   K 3.7 06/01/2023   CL 98 06/01/2023   CO2 25 06/01/2023   GLUCOSE 78 06/01/2023   BUN 7 (L) 06/01/2023   CREATININE 0.71 06/01/2023   BILITOT 0.6 06/01/2023   ALKPHOS 61 06/01/2023    AST 25 06/01/2023   ALT 13 06/01/2023   PROT 5.2 (L) 06/01/2023   ALBUMIN 3.5 (L) 06/01/2023   CALCIUM 9.1 06/01/2023   GFRAA 72 01/01/2020    Speciality Comments: No specialty comments available.  Procedures:  No procedures performed Allergies: Spironolactone     Assessment / Plan:     Visit Diagnoses: Primary osteoarthritis of both hands: She has PIP and DIP thickening consistent with osteoarthritis of both hands.  She has tenderness over both wrist joints but no active inflammation was noted.  Limited extension of the left third PIP joint noted.  Offered to inject inflammatory markers as well as anti-CCP and rheumatoid factor but she declined at this time.  Rheumatoid factor positive - Rheumatoid factor was 61.9 on 06/18/2018.  Anti-CCP was negative and ESR was within normal limits at that time.  Patient declined having updated lab work today.  Chronic pain of both shoulders: Patient presents today for further evaluation of pain involving both shoulders, left greater than right.  Her symptoms are not about 2 weeks ago when lifting something heavy.  She has been experiencing some discomfort at night but her range of motion has been preserved.  On examination she has some discomfort with internal rotation but otherwise has good range of motion.  Some tenderness over the subacromial bursa bilaterally.  She currently rates the pain a 0 out of 10 and describes the sensation more as a weakness with range of motion.  Patient plans on trying curcumin.  She does not want to take any over-the-counter pain relievers at this time.  Offered referral to physical therapy but she declined.  Handout of shoulder exercises was provided.  Offered to x-ray her shoulders but she would like to hold off at this time.  If her symptoms persist or worsen she can return for an x-ray and referral to physical therapy can be placed.  Discussed the option of a cortisone injection in the future if needed but discouraged the  use of steroids given that she has osteoporosis and is not currently on treatment.  She voiced understanding.  She will notify us if her symptoms persist or worsen.  Paresthesia of both hands - NCV with EMG on 10/25/2018 which revealed very severe right median nerve entrapment and severe left median nerve entrapment at the wrist.  Trigger finger, right middle finger - Surgical release performed by Dr. Cleophas Dunker Sept  2020. Resolved.  S/P carpal tunnel release - Right-Performed by Dr. Cleophas Dunker Sept 2020. Doing well.  Asymptomatic at this time.  Carpal tunnel syndrome, left upper limb - Severe evident on NCV with EMG performed on 10/25/18.  She had a left carpal tunnel cortisone injection performed on 12/20/2019.  No currently symptomatic.   Primary osteoarthritis of right knee: Good ROM with no discomfort. No warmth or effusion.   Osteopenia of multiple sites - Previous DEXA on 03/26/18: right femoral neck BMD 0.628 with T-score -2.3 with -9% change in BMD in LFN.  DEXA on 05/05/2021:RFN BMD 0.614 with T score -2.1. DEXA updated on 05/10/2023: T-score -2.5.  Dr. Allyne Gee recommended initiating Fosamax 70 mg 1 tablet by mouth once weekly.  The patient is apprehensive side effects.  Discussed the benefits of initiating Fosamax as well as instructions with dosing.  Patient plans on considering Fosamax in more detail and will reach out to Dr. Allyne Gee if she is ready to initiate therapy.  Essential hypertension: Blood pressure was 141/78 today in the office.  Blood pressure was rechecked prior to leaving.  Patient was advised to monitor blood pressure closely and to reach out to PCP if remains elevated.  Orders: No orders of the defined types were placed in this encounter.  No orders of the defined types were placed in this encounter.    Follow-Up Instructions: Return if symptoms worsen or fail to improve, for Osteoarthritis.   Gearldine Bienenstock, PA-C  Note - This record has been created using Dragon  software.  Chart creation errors have been sought, but may not always  have been located. Such creation errors do not reflect on  the standard of medical care.

## 2023-06-10 ENCOUNTER — Encounter: Payer: Self-pay | Admitting: Internal Medicine

## 2023-06-10 DIAGNOSIS — J301 Allergic rhinitis due to pollen: Secondary | ICD-10-CM | POA: Insufficient documentation

## 2023-06-10 DIAGNOSIS — M25532 Pain in left wrist: Secondary | ICD-10-CM | POA: Insufficient documentation

## 2023-06-10 DIAGNOSIS — M816 Localized osteoporosis [Lequesne]: Secondary | ICD-10-CM | POA: Insufficient documentation

## 2023-06-10 HISTORY — DX: Localized osteoporosis (Lequesne): M81.6

## 2023-06-10 NOTE — Assessment & Plan Note (Signed)
 Chronic, she has intermittent sx. I will send rx Nasonex NS as requested. She will use as needed.

## 2023-06-10 NOTE — Assessment & Plan Note (Signed)
 Most recent bone density results reviewed. She prefers to see Rheumatology to determine treatment plan. She is encouraged to engage in weight-bearing exercises at least three days weekly.

## 2023-06-10 NOTE — Assessment & Plan Note (Signed)
 She has upcoming appt with Ortho. Advised to wear sleeve until this appt. She may apply Voltaren gel to affected area twice daily prn.

## 2023-06-10 NOTE — Assessment & Plan Note (Signed)
 Chronic, fair control.  Goal BP<130/80.  Importance of medication/dietary compliance was discussed with the patient.  I question recent compliance with meds. She will continue with furosemide 20mg , olmesartan/hct 40/25mg  and labetalol 100mg  twice daily. She is encouraged to follow a low sodium diet. She will rto in four to six months for re-evaluation.

## 2023-06-10 NOTE — Assessment & Plan Note (Signed)
 She is due for dexa scan at [facility] late February 2025. Will send order now. Encouraged to engage in weight-bearing exercises at least 3 days per week.

## 2023-06-10 NOTE — Assessment & Plan Note (Signed)
 Previous labs reviewed, her A1c has been elevated in the past. I will check an A1c today. Reminded to avoid refined sugars including sugary drinks/foods and processed meats including bacon, sausages and deli meats.

## 2023-06-21 ENCOUNTER — Ambulatory Visit: Attending: Physician Assistant | Admitting: Physician Assistant

## 2023-06-21 ENCOUNTER — Encounter: Payer: Self-pay | Admitting: Physician Assistant

## 2023-06-21 VITALS — BP 141/78 | HR 66 | Resp 14 | Ht 63.0 in | Wt 171.0 lb

## 2023-06-21 DIAGNOSIS — Z9889 Other specified postprocedural states: Secondary | ICD-10-CM | POA: Diagnosis not present

## 2023-06-21 DIAGNOSIS — M19041 Primary osteoarthritis, right hand: Secondary | ICD-10-CM | POA: Diagnosis not present

## 2023-06-21 DIAGNOSIS — G8929 Other chronic pain: Secondary | ICD-10-CM

## 2023-06-21 DIAGNOSIS — G5602 Carpal tunnel syndrome, left upper limb: Secondary | ICD-10-CM | POA: Diagnosis not present

## 2023-06-21 DIAGNOSIS — M65331 Trigger finger, right middle finger: Secondary | ICD-10-CM

## 2023-06-21 DIAGNOSIS — M25511 Pain in right shoulder: Secondary | ICD-10-CM | POA: Diagnosis not present

## 2023-06-21 DIAGNOSIS — M8589 Other specified disorders of bone density and structure, multiple sites: Secondary | ICD-10-CM

## 2023-06-21 DIAGNOSIS — M1711 Unilateral primary osteoarthritis, right knee: Secondary | ICD-10-CM

## 2023-06-21 DIAGNOSIS — M19042 Primary osteoarthritis, left hand: Secondary | ICD-10-CM

## 2023-06-21 DIAGNOSIS — I10 Essential (primary) hypertension: Secondary | ICD-10-CM

## 2023-06-21 DIAGNOSIS — R768 Other specified abnormal immunological findings in serum: Secondary | ICD-10-CM | POA: Diagnosis not present

## 2023-06-21 DIAGNOSIS — M25512 Pain in left shoulder: Secondary | ICD-10-CM

## 2023-06-21 DIAGNOSIS — R202 Paresthesia of skin: Secondary | ICD-10-CM

## 2023-06-21 NOTE — Patient Instructions (Signed)

## 2023-08-09 ENCOUNTER — Other Ambulatory Visit: Payer: Self-pay | Admitting: Internal Medicine

## 2023-08-09 DIAGNOSIS — I1A0 Resistant hypertension: Secondary | ICD-10-CM

## 2023-08-10 ENCOUNTER — Ambulatory Visit
Admission: EM | Admit: 2023-08-10 | Discharge: 2023-08-10 | Disposition: A | Discharge status: Home / Self Care | Attending: Family Medicine | Admitting: Family Medicine

## 2023-08-10 DIAGNOSIS — K648 Other hemorrhoids: Secondary | ICD-10-CM

## 2023-08-10 DIAGNOSIS — K644 Residual hemorrhoidal skin tags: Secondary | ICD-10-CM | POA: Diagnosis not present

## 2023-08-10 NOTE — Discharge Instructions (Signed)
 I have secured an appointment with [facility] on Tuesday May 20th at 10:50am for 11:20am with Dr. Andy Bannister. If you start to have excessive bleeding, severe abdominal pain, fever then please to straight to the emergency room.

## 2023-08-10 NOTE — ED Provider Notes (Signed)
 [facility] Ware [facility]  Note:  This document was prepared using Conservation officer, historic buildings and may include unintentional dictation errors.  MRN: [id number] DOB: 06/07/1940  Subjective:   Tina  W Ware is a 83 y.o. female presenting for an evaluation for bleeding hemorrhoids.  Patient has had this problem before.  She has previously seen a Development worker, international aid but has not followed up recently.  Reports that she is having some perianal pain.  No fever, nausea, vomiting, abdominal pain.  She has tried using over-the-counter creams but is not getting any help.  No current facility-administered medications for this encounter.  Current Outpatient Medications:    aspirin  EC 81 MG tablet, Take 81 mg by mouth daily., Disp: , Rfl:    Cholecalciferol  (VITAMIN D) 50 MCG (2000 UT) tablet, Take 2,000 Units by mouth daily. , Disp: , Rfl:    furosemide  (LASIX ) 20 MG tablet, Take 1 tablet (20 mg total) by mouth daily as needed for fluid., Disp: 90 tablet, Rfl: 2   hydrocortisone  (ANUSOL -HC) 2.5 % rectal cream, Place 1 Application rectally 2 (two) times daily as needed for hemorrhoids or anal itching., Disp: 30 g, Rfl: 2   labetalol  (NORMODYNE ) 100 MG tablet, TAKE 1 TABLET BY MOUTH TWICE A DAY, Disp: 180 tablet, Rfl: 3   mometasone  (NASONEX ) 50 MCG/ACT nasal spray, Place 2 sprays into the nose daily., Disp: 51 each, Rfl: 1   olmesartan -hydrochlorothiazide (BENICAR  HCT) 40-25 MG tablet, Take 1 tablet by mouth daily. (Patient taking differently: Take 0.5 tablets by mouth daily.), Disp: 90 tablet, Rfl: 1   polyethylene glycol (MIRALAX ) 17 g packet, Take 17 g by mouth daily as needed., Disp: 30 each, Rfl: 3   Allergies  Allergen Reactions   Spironolactone  Other (See Comments)    Hyponatremia    Past Medical History:  Diagnosis Date   Arthritis    RA   CAD (coronary artery disease)    Cataract 06/ 2020   Cataracts in June and July 2020   Clotting disorder [facility]) 09/ 2020   Occasional  hemorrhoidal bleeding   Encounter for gynecological examination (general) (routine) with abnormal findings 02/14/2023   Fibroid, uterine    Hypertension    Localized osteoporosis without current pathological fracture 06/10/2023     Past Surgical History:  Procedure Laterality Date   CARPAL TUNNEL RELEASE Right 12/20/2018   CATARACT EXTRACTION, BILATERAL     CHOLECYSTECTOMY     EYE SURGERY     08/2018 and [id number]   TRIGGER FINGER RELEASE Right 12/20/2018   TUBAL LIGATION  3/25 /1985    Family History  Problem Relation Age of Onset   Heart disease Mother    Dementia Mother    Congestive Heart Failure Mother    Arthritis Mother    COPD Mother    Hypertension Mother    Parkinson's disease Father    Asthma Father    Pancreatic cancer Sister    Asthma Sister    Cancer Sister     Social History   Tobacco Use   Smoking status: Never    Passive exposure: Past   Smokeless tobacco: Never   Tobacco comments:    I never smoked.  Vaping Use   Vaping status: Never Used  Substance Use Topics   Alcohol use: No   Drug use: No    ROS   Objective:   Vitals: BP (!) 166/81 (BP Location: Right Arm)   Pulse 88   Temp 98.3 F (36.8 C) (Oral)  Resp 16   SpO2 98%   Physical Exam Exam conducted with a chaperone present Tree surgeon).  Constitutional:      General: She is not in acute distress.    Appearance: Normal appearance. She is well-developed. She is not ill-appearing, toxic-appearing or diaphoretic.  HENT:     Head: Normocephalic and atraumatic.     Nose: Nose normal.     Mouth/Throat:     Mouth: Mucous membranes are moist.  Eyes:     General: No scleral icterus.       Right eye: No discharge.        Left eye: No discharge.     Extraocular Movements: Extraocular movements intact.  Cardiovascular:     Rate and Rhythm: Normal rate.  Pulmonary:     Effort: Pulmonary effort is normal.  Genitourinary:   Skin:    General: Skin is warm and dry.  Neurological:      General: No focal deficit present.     Mental Status: She is alert and oriented to person, place, and time.  Psychiatric:        Mood and Affect: Mood normal.        Behavior: Behavior normal.       Assessment and Plan :   PDMP not reviewed this encounter.  1. Internal and external bleeding hemorrhoids      Case discussed with [facility] surgery on-call for possibility of prolapsed internal hemorrhoid, prolapsed rectum, bleeding external hemorrhoids.  She will be seen urgently through their clinic as an outpatient.  She is to maintain strict ER precautions otherwise.  Counseled patient on potential for adverse effects with medications prescribed/recommended today, ER and return-to-clinic precautions discussed, patient verbalized understanding.    Tina Ware, [place] 08/10/23 [phone]

## 2023-08-10 NOTE — ED Triage Notes (Signed)
 Patient presents to the office for hemorrhoids flare-up. Patient states this is a recurrent issue. Patient states she was using OTC cream but they are not helping.

## 2023-08-15 ENCOUNTER — Ambulatory Visit: Payer: Self-pay | Admitting: General Surgery

## 2023-08-15 ENCOUNTER — Telehealth: Payer: Self-pay

## 2023-08-15 DIAGNOSIS — K643 Fourth degree hemorrhoids: Secondary | ICD-10-CM | POA: Diagnosis not present

## 2023-08-15 NOTE — H&P (Signed)
 Expand All Collapse All      REFERRING PHYSICIAN:  Adolph Hoop, [place]   PROVIDER:  Denese Finn, MD   MRN: [id number] DOB: 1940/08/01 DATE OF ENCOUNTER: 08/15/2023   Subjective    Chief Complaint: New Patient (Possible rectal prolapse vs hemorrhoids )       History of Present Illness: Tina  [name] is a 83 y.o. female who is seen today as an office consultation at the request of her Pinkey Brier for evaluation of New Patient (Possible rectal prolapse vs hemorrhoids ) .  Patient presented to urgent care recently with bleeding hemorrhoids.  She was diagnosed with possible prolapse internal hemorrhoids, prolapsed rectum or bleeding external hemorrhoids.  She is here today for further evaluation.  She reports episodes of constipation approximately twice a month.  This requires her to strain and she develops soreness afterwards.  During her last episode of constipation she developed rectal bleeding.  This occurs on occasion.  She has had a colonoscopy in the last couple years.  It was normal per her recollection.     Review of Systems: A complete review of systems was obtained from the patient.  I have reviewed this information and discussed as appropriate with the patient.  See HPI as well for other ROS.     Medical History: Past Medical History      Past Medical History:  Diagnosis Date   Arthritis     Hypertension           Problem List  There is no problem list on file for this patient.      Past Surgical History       Past Surgical History:  Procedure Laterality Date   CHOLECYSTECTOMY            Allergies  No Known Allergies     Medications Ordered Prior to Encounter        Current Outpatient Medications on File Prior to Visit  Medication Sig Dispense Refill   aspirin  81 MG EC tablet Take 81 mg by mouth once daily       cholecalciferol  1000 unit tablet Take by mouth       FUROsemide  (LASIX ) 20 MG tablet Take 20 mg by mouth once daily        olmesartan -amLODIPine -hydroCHLOROthiazide (TRIBENZOR) 40-10-25 mg tablet Take 1 tablet by mouth once daily       polyethylene glycol (MIRALAX ) packet Take 17 g by mouth once daily Mix in 4-8ounces of fluid prior to taking.        No current facility-administered medications on file prior to visit.        Family History       Family History  Problem Relation Age of Onset   High blood pressure (Hypertension) Mother          Tobacco Use History  Social History       Tobacco Use  Smoking Status Never  Smokeless Tobacco Never        Social History  Social History        Socioeconomic History   Marital status: Single  Tobacco Use   Smoking status: Never   Smokeless tobacco: Never  Substance and Sexual Activity   Alcohol use: Never   Drug use: Never    Social Drivers of Acupuncturist Strain: Low Risk  (05/31/2023)    Received from [facility]    Overall Financial Resource Strain (CARDIA)     Difficulty  of Paying Living Expenses: Not hard at all  Food Insecurity: No Food Insecurity (05/31/2023)    Received from [facility]    Hunger Vital Sign     Worried About Running Out of Food in the Last Year: Never true     Ran Out of Food in the Last Year: Never true  Transportation Needs: No Transportation Needs (05/31/2023)    Received from [facility] - Transportation     Lack of Transportation (Medical): No     Lack of Transportation (Non-Medical): No  Physical Activity: Insufficiently Active (05/31/2023)    Received from [facility]    Exercise Vital Sign     Days of Exercise per Week: 4 days     Minutes of Exercise per Session: 30 min  Stress: No Stress Concern Present (05/31/2023)    [facility] - Occupational Stress Questionnaire     Feeling of Stress : Not at all  Social Connections: Moderately Integrated (05/31/2023)    [facility]    Social Connection and Isolation  Panel [NHANES]     Frequency of Communication with Friends and Family: More than three times a week     Frequency of Social Gatherings with Friends and Family: Once a week     Attends Religious Services: More than 4 times per year     Active Member of [employer] or Organizations: Yes     Attends Engineer, structural: More than 4 times per year     Marital Status: Divorced  Housing Stability: Unknown (08/15/2023)    Housing Stability Vital Sign     Homeless in the Last Year: No        Objective:         Vitals:    08/15/23 1145  BP: (!) 146/80  Pulse: 82  Temp: 36.4 C (97.6 F)  SpO2: 96%  Weight: 76.6 kg (168 lb 12.8 oz)  Height: 157.5 cm (5\' 2" )  PainSc: 0-No pain      Exam Gen: NAD Abd: soft Rectal: Prolapsed right anterior hemorrhoid, partially reducible     Labs, Imaging and Diagnostic Testing:   Procedure: Anoscopy Surgeon: Andy Bannister After the risks and benefits were explained, written consent was obtained for above procedure.  A medical assistant chaperone was present thoroughout the entire procedure.  Anesthesia: none Diagnosis: hemorrhoids Findings: Grade 4 right anterior hemorrhoid, grade 2 right posterior hemorrhoid, non-inflamed     Assessment and Plan:  Diagnoses and all orders for this visit:   Prolapsed internal hemorrhoids, grade 52       83 year old female with rectal bleeding and pain.  On exam today she has a partially reducible right anterior hemorrhoid that seems to be the cause of most of her symptoms.  We discussed surgical excision as a way to relieve her symptoms.  We discussed risk of bleeding, pain and recurrence.  All questions were answered.  Patient would like to proceed with this in June.   Fernande Howells, MD Colon and Rectal Surgery [facility]

## 2023-08-15 NOTE — Telephone Encounter (Signed)
   Pre-operative Risk Assessment    Patient Name: Tina  MAYANA Ware  DOB: [birth year] MRN: [id number]   Date of last office visit: 05/03/23 Date of next office visit: Not scheduled   Request for Surgical Clearance    Procedure:  Hemorrhoid surgery  Date of Surgery:  Clearance TBD                                Surgeon:  Joyce Nixon MD Surgeon's Group or Practice Name:  [facility] Phone number:  [phone] Fax number:  [phone]   Type of Clearance Requested:   - Medical    Type of Anesthesia:  General    Additional requests/questions:    [name]   08/15/2023, 3:49 PM

## 2023-08-15 NOTE — Telephone Encounter (Signed)
 ADDENDUM TO CLEARANCE REQUEST: REQUESTING OFFICE NEEDS ASA RECOMMENDATIONS.

## 2023-08-15 NOTE — Telephone Encounter (Signed)
   Name: Tina  SHEREE Ware  DOB: 09/06/1940  MRN: [id number]  Primary Cardiologist: Knox Perl, MD   Preoperative team, please contact this patient and set up a phone call appointment for further preoperative risk assessment. Please obtain consent and complete medication review. Thank you for your help.  I confirm that guidance regarding antiplatelet and oral anticoagulation therapy has been completed and, if necessary, noted below.  None requested  I also confirmed the patient resides in the state of [place] . As per [facility] Medical Board telemedicine laws, the patient must reside in the state in which the provider is licensed.   Ava Boatman, NP 08/15/2023, 4:18 PM [facility]

## 2023-08-15 NOTE — Telephone Encounter (Signed)
 LVM asking pt to call office to schedule virtual visit for preop clearance

## 2023-08-16 NOTE — Telephone Encounter (Signed)
  Pt returned call and made an appt for televisit on 05/28 at 11:00 AM

## 2023-08-16 NOTE — Telephone Encounter (Signed)
 LVM asking pt to call our office to schedule VV for preop clearance.

## 2023-08-16 NOTE — Telephone Encounter (Signed)
 Need to review meds for preop appt.

## 2023-08-17 NOTE — Telephone Encounter (Signed)
 Tried calling patient to review meds no answer left a detailed vm to call back

## 2023-08-17 NOTE — Telephone Encounter (Signed)
 Patient called back and meds were reviewed

## 2023-08-23 ENCOUNTER — Ambulatory Visit: Attending: Cardiology | Admitting: Nurse Practitioner

## 2023-08-23 VITALS — BP 142/81 | HR 72

## 2023-08-23 DIAGNOSIS — Z01810 Encounter for preprocedural cardiovascular examination: Secondary | ICD-10-CM | POA: Diagnosis not present

## 2023-08-23 NOTE — Progress Notes (Signed)
 Virtual Visit via Telephone Note   Because of Tina Ware  W Osika co-morbid illnesses, she is at least at moderate risk for complications without adequate follow up.  This format is felt to be most appropriate for this patient at this time.  Due to technical limitations with video connection (technology), today's appointment will be conducted as an audio only telehealth visit, and Kataleena  W Mcclaran verbally agreed to proceed in this manner.   All issues noted in this document were discussed and addressed.  No physical exam could be performed with this format.  Evaluation Performed:  Preoperative cardiovascular risk assessment _____________   Date:  08/23/2023   Patient ID:  Tina Ware, DOB November 12, 1940, MRN [id number] Patient Location:  Home Provider location:   Office  Primary Care Provider:  Cleave Curling, MD Primary Cardiologist:  Knox Perl, MD  Chief Complaint / Patient Profile   83 y.o. y/o female with a h/o chronic diastolic heart failure, hypertension, and rheumatoid arthritis who is pending hemorrhoid surgery with Dr. Joyce Nixon of [facility] and presents today for telephonic preoperative cardiovascular risk assessment.  History of Present Illness    Tina  [name] is a 83 y.o. female who presents via audio/video conferencing for a telehealth visit today.  Pt was last seen in cardiology clinic on 05/03/2023 by Dr. Berry Bristol. At that time Cleola  W Dondero was doing well.  The patient is now pending procedure as outlined above. Since her last visit, she has done well from a cardiac standpoint.   She denies chest pain, palpitations, dyspnea, pnd, orthopnea, n, v, dizziness, syncope, edema, weight gain, or early satiety. All other systems reviewed and are otherwise negative except as noted above.   Past Medical History    Past Medical History:  Diagnosis Date   Arthritis    RA   CAD (coronary artery disease)    Cataract 06/ 2020   Cataracts in June and  July 2020   Clotting disorder [facility]) 09/ 2020   Occasional hemorrhoidal bleeding   Encounter for gynecological examination (general) (routine) with abnormal findings 02/14/2023   Fibroid, uterine    Hypertension    Localized osteoporosis without current pathological fracture 06/10/2023   Past Surgical History:  Procedure Laterality Date   CARPAL TUNNEL RELEASE Right 12/20/2018   CATARACT EXTRACTION, BILATERAL     CHOLECYSTECTOMY     EYE SURGERY     08/2018 and [id number]   TRIGGER FINGER RELEASE Right 12/20/2018   TUBAL LIGATION  3/25 /1985    Allergies  Allergies  Allergen Reactions   Spironolactone  Other (See Comments)    Hyponatremia    Home Medications    Prior to Admission medications   Medication Sig Start Date End Date Taking? Authorizing Provider  aspirin  EC 81 MG tablet Take 81 mg by mouth daily.    [provider]  Cholecalciferol  (VITAMIN D) 50 MCG (2000 UT) tablet Take 2,000 Units by mouth daily.     [provider]  furosemide  (LASIX ) 20 MG tablet Take 1 tablet (20 mg total) by mouth daily as needed for fluid. 05/30/23   Cleave Curling, MD  hydrocortisone  (ANUSOL -HC) 2.5 % rectal cream Place 1 Application rectally 2 (two) times daily as needed for hemorrhoids or anal itching. 02/14/23   Cleave Curling, MD  labetalol  (NORMODYNE ) 100 MG tablet TAKE 1 TABLET BY MOUTH TWICE A DAY 08/09/23   Cleave Curling, MD  mometasone  (NASONEX ) 50 MCG/ACT nasal spray Place 2 sprays into the nose daily. 06/01/23  Cleave Curling, MD  olmesartan -hydrochlorothiazide (BENICAR  HCT) 40-25 MG tablet Take 1 tablet by mouth daily. Patient taking differently: Take 0.5 tablets by mouth daily. 02/14/23   Cleave Curling, MD  polyethylene glycol (MIRALAX ) 17 g packet Take 17 g by mouth daily as needed. 07/25/22   Cleave Curling, MD    Physical Exam    Vital Signs:  Tina  [name] does not have vital signs available for review today. BP 142/81 mmHg, HR 72 bpm  Given telephonic  nature of communication, physical exam is limited. AAOx3. NAD. Normal affect.  Speech and respirations are unlabored.  Accessory Clinical Findings    None  Assessment & Plan    1.  Preoperative Cardiovascular Risk Assessment:  According to the Revised Cardiac Risk Index (RCRI), her Perioperative Risk of Major Cardiac Event is (%): 0.9. Her Functional Capacity in METs is: 5.07 according to the Duke Activity Status Index (DASI).Therefore, based on ACC/AHA guidelines, patient would be at acceptable risk for the planned procedure without further cardiovascular testing.   The patient was advised that if she develops new symptoms prior to surgery to contact our office to arrange for a follow-up visit, and she verbalized understanding.  Regarding ASA therapy, we recommend continuation of ASA throughout the perioperative period.  However, if the surgeon feels that cessation of ASA is required in the perioperative period, it may be stopped 5-7 days prior to surgery with a plan to resume it as soon as felt to be feasible from a surgical standpoint in the post-operative period.  A copy of this note will be routed to requesting surgeon.  Time:   Today, I have spent 8 minutes with the patient with telehealth technology discussing medical history, symptoms, and management plan.     Jude Norton, NP  08/23/2023, 11:12 AM

## 2023-10-04 ENCOUNTER — Encounter: Payer: Self-pay | Admitting: Internal Medicine

## 2023-10-04 ENCOUNTER — Ambulatory Visit: Admitting: Internal Medicine

## 2023-10-04 VITALS — BP 140/80 | HR 77 | Temp 98.8°F | Ht 63.0 in | Wt 174.8 lb

## 2023-10-04 DIAGNOSIS — I5032 Chronic diastolic (congestive) heart failure: Secondary | ICD-10-CM | POA: Diagnosis not present

## 2023-10-04 DIAGNOSIS — I110 Hypertensive heart disease with heart failure: Secondary | ICD-10-CM | POA: Diagnosis not present

## 2023-10-04 DIAGNOSIS — Z2821 Immunization not carried out because of patient refusal: Secondary | ICD-10-CM | POA: Diagnosis not present

## 2023-10-04 DIAGNOSIS — Z6830 Body mass index (BMI) 30.0-30.9, adult: Secondary | ICD-10-CM

## 2023-10-04 DIAGNOSIS — R600 Localized edema: Secondary | ICD-10-CM | POA: Diagnosis not present

## 2023-10-04 DIAGNOSIS — E66811 Obesity, class 1: Secondary | ICD-10-CM | POA: Diagnosis not present

## 2023-10-04 DIAGNOSIS — E6609 Other obesity due to excess calories: Secondary | ICD-10-CM | POA: Diagnosis not present

## 2023-10-04 DIAGNOSIS — M791 Myalgia, unspecified site: Secondary | ICD-10-CM

## 2023-10-04 MED ORDER — OLMESARTAN MEDOXOMIL-HCTZ 40-25 MG PO TABS: 1.0000 | ORAL_TABLET | Freq: Every day | ORAL | 1 refills | Status: DC

## 2023-10-04 NOTE — Progress Notes (Signed)
 I,Victoria T Emmitt, CMA,acting as a Neurosurgeon for [name], MD.,have documented all relevant documentation on the behalf of [name], MD,as directed by  [name], MD while in the presence of [name], MD.  Subjective:  Patient ID: Tina  LELON Ware , female    DOB: 1940-05-29 , 83 y.o.   MRN: [id number]  Chief Complaint  Patient presents with   Hypertension    Patient presents today for a bp and pre dm follow up, Patient reports compliance with medication. Patient denies any chest pain, SOB, or headaches. Patient has no concerns today.     HPI Discussed the use of AI scribe software for clinical note transcription with the patient, who gave verbal consent to proceed.  History of Present Illness Tina  LELON Ware is an 83 year old female with hypertension who presents for a blood pressure check.  Her blood pressure is elevated today despite taking her medication. She takes labetalol  twice daily and olmesartan  40/25 mg, but only half a tablet due to experiencing 'jitters' with the full dose. She also takes a baby aspirin  daily. She has not been taking furosemide  regularly, only once last week, and acknowledges the need to take it more frequently, especially during the summer due to swelling.  She experiences intermittent muscle pain in both shoulders and arms. She drinks at least four glasses of water a day and takes calcium  and magnesium  supplements, though not regularly. She reports sleeping well without issues and notes occasional cramping in her arms.  No dizziness, but she feels jittery with the full dose of olmesartan . Muscle pain in shoulders and arms is present, but no other concerns.   Hypertension This is a chronic problem. The current episode started more than 1 year ago. The problem has been gradually improving since onset. The problem is controlled. Pertinent negatives include no blurred vision, palpitations or shortness of breath. Risk factors for  coronary artery disease include sedentary lifestyle, post-menopausal state and obesity. The current treatment provides moderate improvement. Compliance problems include exercise.      Past Medical History:  Diagnosis Date   Arthritis    RA   CAD (coronary artery disease)    Cataract 06/ 2020   Cataracts in June and July 2020   Clotting disorder [facility]) 09/ 2020   Occasional hemorrhoidal bleeding   Encounter for gynecological examination (general) (routine) with abnormal findings 02/14/2023   Fibroid, uterine    Hypertension    Currently taking medications   Localized osteoporosis without current pathological fracture 06/10/2023     Family History  Problem Relation Age of Onset   Heart disease Mother    Dementia Mother    Congestive Heart Failure Mother    Arthritis Mother    COPD Mother    Hypertension Mother    Parkinson's disease Father    Asthma Father    Pancreatic cancer Sister    Asthma Sister    Cancer Sister      Current Outpatient Medications:    aspirin  EC 81 MG tablet, Take 81 mg by mouth daily., Disp: , Rfl:    Cholecalciferol  (VITAMIN D) 50 MCG (2000 UT) tablet, Take 2,000 Units by mouth daily. , Disp: , Rfl:    furosemide  (LASIX ) 20 MG tablet, Take 1 tablet (20 mg total) by mouth daily as needed for fluid., Disp: 90 tablet, Rfl: 2   hydrocortisone  (ANUSOL -HC) 2.5 % rectal cream, Place 1 Application rectally 2 (two) times daily as needed for hemorrhoids or  anal itching., Disp: 30 g, Rfl: 2   labetalol  (NORMODYNE ) 100 MG tablet, TAKE 1 TABLET BY MOUTH TWICE A DAY, Disp: 180 tablet, Rfl: 3   mometasone  (NASONEX ) 50 MCG/ACT nasal spray, Place 2 sprays into the nose daily., Disp: 51 each, Rfl: 1   polyethylene glycol (MIRALAX ) 17 g packet, Take 17 g by mouth daily as needed., Disp: 30 each, Rfl: 3   olmesartan -hydrochlorothiazide (BENICAR  HCT) 40-25 MG tablet, Take 1 tablet by mouth daily., Disp: 90 tablet, Rfl: 1   Allergies  Allergen Reactions   Spironolactone   Other (See Comments)    Hyponatremia     Review of Systems  Constitutional: Negative.   Eyes:  Negative for blurred vision.  Respiratory: Negative.  Negative for shortness of breath.   Cardiovascular: Negative.  Negative for palpitations.  Gastrointestinal: Negative.   Neurological: Negative.   Psychiatric/Behavioral: Negative.       Today's Vitals   10/04/23 1126 10/04/23 1154  BP: (!) 160/80 (!) 140/80  Pulse: 77   Temp: 98.8 F (37.1 C)   TempSrc: Oral   Weight: 174 lb 12.8 oz (79.3 kg)   Height: 5' 3 (1.6 m)   PainSc: 0-No pain    Body mass index is 30.96 kg/m.  Wt Readings from Last 3 Encounters:  10/04/23 174 lb 12.8 oz (79.3 kg)  06/21/23 171 lb (77.6 kg)  06/01/23 172 lb 12.8 oz (78.4 kg)    BP Readings from Last 3 Encounters:  10/04/23 (!) 140/80  08/23/23 (!) 142/81  08/10/23 (!) 166/81    Objective:  Physical Exam Vitals and nursing note reviewed.  Constitutional:      Appearance: Normal appearance. She is obese.  HENT:     Head: Normocephalic and atraumatic.  Eyes:     Extraocular Movements: Extraocular movements intact.  Cardiovascular:     Rate and Rhythm: Normal rate and regular rhythm.     Heart sounds: Normal heart sounds.  Pulmonary:     Effort: Pulmonary effort is normal.     Breath sounds: Normal breath sounds.  Musculoskeletal:     Cervical back: Normal range of motion.     Right lower leg: Edema present.     Left lower leg: Edema present.  Skin:    General: Skin is warm.  Neurological:     General: No focal deficit present.     Mental Status: She is alert.  Psychiatric:        Mood and Affect: Mood normal.        Behavior: Behavior normal.       Assessment And Plan:  Hypertensive heart disease with chronic diastolic congestive heart failure (HCC) Assessment & Plan: Hypertension poorly controlled with elevated readings. Non-compliance with full dose olmesartan  due to side effects. - Instructed to take half a tablet of  olmesartan  40/25 mg twice daily. - Continue labetalol  twice daily. - Monitor blood pressure at home three times a week. - Schedule nurse visit in two weeks for blood pressure assessment. - Perform kidney function test today and recheck in two weeks.  Orders: -     CMP14+EGFR -     Magnesium  -     Olmesartan  Medoxomil-HCTZ; Take 1 tablet by mouth daily.  Dispense: 90 tablet; Refill: 1  Myalgia Assessment & Plan: Intermittent muscle pain possibly related to lifting. - Check muscle enzymes and electrolytes. - Advised calcium  and magnesium  supplements on Monday, Wednesday, and Friday.  Orders: -     CK -  Magnesium   Peripheral edema Assessment & Plan: Swelling likely due to heat and irregular furosemide  intake. - Advised taking furosemide  at least twice a week, especially in summer.   Class 1 obesity due to excess calories with serious comorbidity and body mass index (BMI) of 30.0 to 30.9 in adult Assessment & Plan: She is encouraged to strive for BMI </= 29 to decrease cardiac risk. Advised to aim for at least 150 minutes of exercise per week.    COVID-19 vaccination declined  Return in 2 weeks (on 10/18/2023), or [place] - bp check, check BMP dx: z79.899.  Patient was given opportunity to ask questions. Patient verbalized understanding of the plan and was able to repeat key elements of the plan. All questions were answered to their satisfaction.    I, [name], MD, have reviewed all documentation for this visit. The documentation on 10/04/23 for the exam, diagnosis, procedures, and orders are all accurate and complete.   IF YOU HAVE BEEN REFERRED TO A SPECIALIST, IT MAY TAKE 1-2 WEEKS TO SCHEDULE/PROCESS THE REFERRAL. IF YOU HAVE NOT HEARD FROM US /SPECIALIST IN TWO WEEKS, PLEASE GIVE US  A CALL AT [phone].   THE PATIENT IS ENCOURAGED TO PRACTICE SOCIAL DISTANCING DUE TO THE COVID-19 PANDEMIC.

## 2023-10-04 NOTE — Patient Instructions (Signed)
 Hypertension, Adult Hypertension is another name for high blood pressure. High blood pressure forces your heart to work harder to pump blood. This can cause problems over time. There are two numbers in a blood pressure reading. There is a top number (systolic) over a bottom number (diastolic). It is best to have a blood pressure that is below 120/80. What are the causes? The cause of this condition is not known. Some other conditions can lead to high blood pressure. What increases the risk? Some lifestyle factors can make you more likely to develop high blood pressure: Smoking. Not getting enough exercise or physical activity. Being overweight. Having too much fat, sugar, calories, or salt (sodium) in your diet. Drinking too much alcohol. Other risk factors include: Having any of these conditions: Heart disease. Diabetes. High cholesterol. Kidney disease. Obstructive sleep apnea. Having a family history of high blood pressure and high cholesterol. Age. The risk increases with age. Stress. What are the signs or symptoms? High blood pressure may not cause symptoms. Very high blood pressure (hypertensive crisis) may cause: Headache. Fast or uneven heartbeats (palpitations). Shortness of breath. Nosebleed. Vomiting or feeling like you may vomit (nauseous). Changes in how you see. Very bad chest pain. Feeling dizzy. Seizures. How is this treated? This condition is treated by making healthy lifestyle changes, such as: Eating healthy foods. Exercising more. Drinking less alcohol. Your doctor may prescribe medicine if lifestyle changes do not help enough and if: Your top number is above 130. Your bottom number is above 80. Your personal target blood pressure may vary. Follow these instructions at home: Eating and drinking  If told, follow the DASH eating plan. To follow this plan: Fill one half of your plate at each meal with fruits and vegetables. Fill one fourth of your plate  at each meal with whole grains. Whole grains include whole-wheat pasta, brown rice, and whole-grain bread. Eat or drink low-fat dairy products, such as skim milk or low-fat yogurt. Fill one fourth of your plate at each meal with low-fat (lean) proteins. Low-fat proteins include fish, chicken without skin, eggs, beans, and tofu. Avoid fatty meat, cured and processed meat, or chicken with skin. Avoid pre-made or processed food. Limit the amount of salt in your diet to less than 1,500 mg each day. Do not drink alcohol if: Your doctor tells you not to drink. You are pregnant, may be pregnant, or are planning to become pregnant. If you drink alcohol: Limit how much you have to: 0-1 drink a day for women. 0-2 drinks a day for men. Know how much alcohol is in your drink. In the [place] one drink equals one 12 oz bottle of beer (355 mL), one 5 oz glass of wine (148 mL), or one 1 oz glass of hard liquor (44 mL). Lifestyle  Work with your doctor to stay at a healthy weight or to lose weight. Ask your doctor what the best weight is for you. Get at least 30 minutes of exercise that causes your heart to beat faster (aerobic exercise) most days of the week. This may include walking, swimming, or biking. Get at least 30 minutes of exercise that strengthens your muscles (resistance exercise) at least 3 days a week. This may include lifting weights or doing Pilates. Do not smoke or use any products that contain nicotine or tobacco. If you need help quitting, ask your doctor. Check your blood pressure at home as told by your doctor. Keep all follow-up visits. Medicines Take over-the-counter and prescription medicines  only as told by your doctor. Follow directions carefully. Do not skip doses of blood pressure medicine. The medicine does not work as well if you skip doses. Skipping doses also puts you at risk for problems. Ask your doctor about side effects or reactions to medicines that you should watch  for. Contact a doctor if: You think you are having a reaction to the medicine you are taking. You have headaches that keep coming back. You feel dizzy. You have swelling in your ankles. You have trouble with your vision. Get help right away if: You get a very bad headache. You start to feel mixed up (confused). You feel weak or numb. You feel faint. You have very bad pain in your: Chest. Belly (abdomen). You vomit more than once. You have trouble breathing. These symptoms may be an emergency. Get help right away. Call 911. Do not wait to see if the symptoms will go away. Do not drive yourself to the hospital. Summary Hypertension is another name for high blood pressure. High blood pressure forces your heart to work harder to pump blood. For most people, a normal blood pressure is less than 120/80. Making healthy choices can help lower blood pressure. If your blood pressure does not get lower with healthy choices, you may need to take medicine. This information is not intended to replace advice given to you by your health care provider. Make sure you discuss any questions you have with your health care provider. Document Revised: 12/31/2020 Document Reviewed: 12/31/2020 Elsevier Patient Education  2024 [employer].

## 2023-10-05 LAB — CMP14+EGFR
ALT: 12 IU/L (ref 0–32)
AST: 23 IU/L (ref 0–40)
Albumin: 3.8 g/dL (ref 3.7–4.7)
Alkaline Phosphatase: 55 IU/L (ref 44–121)
BUN/Creatinine Ratio: 13 (ref 12–28)
BUN: 9 mg/dL (ref 8–27)
Bilirubin Total: 0.7 mg/dL (ref 0.0–1.2)
CO2: 24 mmol/L (ref 20–29)
Calcium: 9.4 mg/dL (ref 8.7–10.3)
Chloride: 98 mmol/L (ref 96–106)
Creatinine, Ser: 0.68 mg/dL (ref 0.57–1.00)
Globulin, Total: 1.6 g/dL (ref 1.5–4.5)
Glucose: 85 mg/dL (ref 70–99)
Potassium: 4.3 mmol/L (ref 3.5–5.2)
Sodium: 136 mmol/L (ref 134–144)
Total Protein: 5.4 g/dL — ABNORMAL LOW (ref 6.0–8.5)
eGFR: 87 mL/min/1.73 (ref >59)

## 2023-10-05 LAB — CK: Total CK: 233 U/L — ABNORMAL HIGH (ref 26–161)

## 2023-10-05 LAB — MAGNESIUM: Magnesium: 1.9 mg/dL (ref 1.6–2.3)

## 2023-10-07 ENCOUNTER — Ambulatory Visit: Payer: Self-pay | Admitting: Internal Medicine

## 2023-10-08 DIAGNOSIS — M791 Myalgia, unspecified site: Secondary | ICD-10-CM | POA: Insufficient documentation

## 2023-10-08 NOTE — Assessment & Plan Note (Signed)
 Hypertension poorly controlled with elevated readings. Non-compliance with full dose olmesartan  due to side effects. - Instructed to take half a tablet of olmesartan  40/25 mg twice daily. - Continue labetalol  twice daily. - Monitor blood pressure at home three times a week. - Schedule nurse visit in two weeks for blood pressure assessment. - Perform kidney function test today and recheck in two weeks.

## 2023-10-08 NOTE — Assessment & Plan Note (Signed)
 Swelling likely due to heat and irregular furosemide  intake. - Advised taking furosemide  at least twice a week, especially in summer.

## 2023-10-08 NOTE — Assessment & Plan Note (Signed)
 She is encouraged to strive for BMI </= 29 to decrease cardiac risk. Advised to aim for at least 150 minutes of exercise per week.

## 2023-10-08 NOTE — Assessment & Plan Note (Signed)
 Intermittent muscle pain possibly related to lifting. - Check muscle enzymes and electrolytes. - Advised calcium  and magnesium  supplements on Monday, Wednesday, and Friday.

## 2023-10-17 ENCOUNTER — Ambulatory Visit

## 2023-10-17 ENCOUNTER — Other Ambulatory Visit: Payer: Self-pay

## 2023-10-20 ENCOUNTER — Other Ambulatory Visit

## 2023-10-20 ENCOUNTER — Ambulatory Visit

## 2023-10-20 VITALS — BP 130/80 | HR 80 | Temp 98.1°F | Ht 63.0 in | Wt 174.0 lb

## 2023-10-20 DIAGNOSIS — Z79899 Other long term (current) drug therapy: Secondary | ICD-10-CM

## 2023-10-20 DIAGNOSIS — I110 Hypertensive heart disease with heart failure: Secondary | ICD-10-CM

## 2023-10-20 LAB — BASIC METABOLIC PANEL WITH GFR
BUN/Creatinine Ratio: 10 — ABNORMAL LOW (ref 12–28)
BUN: 7 mg/dL — ABNORMAL LOW (ref 8–27)
CO2: 24 mmol/L (ref 20–29)
Calcium: 9 mg/dL (ref 8.7–10.3)
Chloride: 90 mmol/L — ABNORMAL LOW (ref 96–106)
Creatinine, Ser: 0.69 mg/dL (ref 0.57–1.00)
Glucose: 105 mg/dL — ABNORMAL HIGH (ref 70–99)
Potassium: 3.7 mmol/L (ref 3.5–5.2)
Sodium: 128 mmol/L — ABNORMAL LOW (ref 134–144)
eGFR: 87 mL/min/1.73 (ref >59)

## 2023-10-20 NOTE — Progress Notes (Cosign Needed Addendum)
 Patient presents today for bpc. She reports compliance with medications. She takes Labetalol  100MG  & Olmesartan -hydrochlorothiazide 40-25MG . She reports taking both medications in the morning. Denies headache, chest pain & sob.  BP Readings from Last 3 Encounters:  10/20/23 130/80  10/04/23 (!) 140/80  08/23/23 (!) 142/81

## 2023-10-20 NOTE — Patient Instructions (Signed)
 Hypertension, Adult Hypertension is another name for high blood pressure. High blood pressure forces your heart to work harder to pump blood. This can cause problems over time. There are two numbers in a blood pressure reading. There is a top number (systolic) over a bottom number (diastolic). It is best to have a blood pressure that is below 120/80. What are the causes? The cause of this condition is not known. Some other conditions can lead to high blood pressure. What increases the risk? Some lifestyle factors can make you more likely to develop high blood pressure: Smoking. Not getting enough exercise or physical activity. Being overweight. Having too much fat, sugar, calories, or salt (sodium) in your diet. Drinking too much alcohol. Other risk factors include: Having any of these conditions: Heart disease. Diabetes. High cholesterol. Kidney disease. Obstructive sleep apnea. Having a family history of high blood pressure and high cholesterol. Age. The risk increases with age. Stress. What are the signs or symptoms? High blood pressure may not cause symptoms. Very high blood pressure (hypertensive crisis) may cause: Headache. Fast or uneven heartbeats (palpitations). Shortness of breath. Nosebleed. Vomiting or feeling like you may vomit (nauseous). Changes in how you see. Very bad chest pain. Feeling dizzy. Seizures. How is this treated? This condition is treated by making healthy lifestyle changes, such as: Eating healthy foods. Exercising more. Drinking less alcohol. Your doctor may prescribe medicine if lifestyle changes do not help enough and if: Your top number is above 130. Your bottom number is above 80. Your personal target blood pressure may vary. Follow these instructions at home: Eating and drinking  If told, follow the DASH eating plan. To follow this plan: Fill one half of your plate at each meal with fruits and vegetables. Fill one fourth of your plate  at each meal with whole grains. Whole grains include whole-wheat pasta, brown rice, and whole-grain bread. Eat or drink low-fat dairy products, such as skim milk or low-fat yogurt. Fill one fourth of your plate at each meal with low-fat (lean) proteins. Low-fat proteins include fish, chicken without skin, eggs, beans, and tofu. Avoid fatty meat, cured and processed meat, or chicken with skin. Avoid pre-made or processed food. Limit the amount of salt in your diet to less than 1,500 mg each day. Do not drink alcohol if: Your doctor tells you not to drink. You are pregnant, may be pregnant, or are planning to become pregnant. If you drink alcohol: Limit how much you have to: 0-1 drink a day for women. 0-2 drinks a day for men. Know how much alcohol is in your drink. In the [place] one drink equals one 12 oz bottle of beer (355 mL), one 5 oz glass of wine (148 mL), or one 1 oz glass of hard liquor (44 mL). Lifestyle  Work with your doctor to stay at a healthy weight or to lose weight. Ask your doctor what the best weight is for you. Get at least 30 minutes of exercise that causes your heart to beat faster (aerobic exercise) most days of the week. This may include walking, swimming, or biking. Get at least 30 minutes of exercise that strengthens your muscles (resistance exercise) at least 3 days a week. This may include lifting weights or doing Pilates. Do not smoke or use any products that contain nicotine or tobacco. If you need help quitting, ask your doctor. Check your blood pressure at home as told by your doctor. Keep all follow-up visits. Medicines Take over-the-counter and prescription medicines  only as told by your doctor. Follow directions carefully. Do not skip doses of blood pressure medicine. The medicine does not work as well if you skip doses. Skipping doses also puts you at risk for problems. Ask your doctor about side effects or reactions to medicines that you should watch  for. Contact a doctor if: You think you are having a reaction to the medicine you are taking. You have headaches that keep coming back. You feel dizzy. You have swelling in your ankles. You have trouble with your vision. Get help right away if: You get a very bad headache. You start to feel mixed up (confused). You feel weak or numb. You feel faint. You have very bad pain in your: Chest. Belly (abdomen). You vomit more than once. You have trouble breathing. These symptoms may be an emergency. Get help right away. Call 911. Do not wait to see if the symptoms will go away. Do not drive yourself to the hospital. Summary Hypertension is another name for high blood pressure. High blood pressure forces your heart to work harder to pump blood. For most people, a normal blood pressure is less than 120/80. Making healthy choices can help lower blood pressure. If your blood pressure does not get lower with healthy choices, you may need to take medicine. This information is not intended to replace advice given to you by your health care provider. Make sure you discuss any questions you have with your health care provider. Document Revised: 12/31/2020 Document Reviewed: 12/31/2020 Elsevier Patient Education  2024 [employer].

## 2023-10-22 ENCOUNTER — Ambulatory Visit: Payer: Self-pay | Admitting: Internal Medicine

## 2023-12-02 ENCOUNTER — Other Ambulatory Visit: Payer: Self-pay | Admitting: Internal Medicine

## 2023-12-02 DIAGNOSIS — J301 Allergic rhinitis due to pollen: Secondary | ICD-10-CM

## 2024-02-06 ENCOUNTER — Encounter (HOSPITAL_BASED_OUTPATIENT_CLINIC_OR_DEPARTMENT_OTHER): Payer: Self-pay

## 2024-02-06 ENCOUNTER — Emergency Department (HOSPITAL_BASED_OUTPATIENT_CLINIC_OR_DEPARTMENT_OTHER)

## 2024-02-06 ENCOUNTER — Other Ambulatory Visit: Payer: Self-pay

## 2024-02-06 ENCOUNTER — Emergency Department (HOSPITAL_BASED_OUTPATIENT_CLINIC_OR_DEPARTMENT_OTHER)
Admission: EM | Admit: 2024-02-06 | Discharge: 2024-02-06 | Disposition: A | Discharge status: Home / Self Care | Attending: Emergency Medicine | Admitting: Emergency Medicine

## 2024-02-06 DIAGNOSIS — Z79899 Other long term (current) drug therapy: Secondary | ICD-10-CM | POA: Insufficient documentation

## 2024-02-06 DIAGNOSIS — R42 Dizziness and giddiness: Secondary | ICD-10-CM | POA: Diagnosis present

## 2024-02-06 DIAGNOSIS — I10 Essential (primary) hypertension: Secondary | ICD-10-CM | POA: Diagnosis not present

## 2024-02-06 DIAGNOSIS — I2510 Atherosclerotic heart disease of native coronary artery without angina pectoris: Secondary | ICD-10-CM | POA: Diagnosis not present

## 2024-02-06 DIAGNOSIS — Z7982 Long term (current) use of aspirin: Secondary | ICD-10-CM | POA: Insufficient documentation

## 2024-02-06 DIAGNOSIS — R609 Edema, unspecified: Secondary | ICD-10-CM | POA: Diagnosis not present

## 2024-02-06 DIAGNOSIS — R600 Localized edema: Secondary | ICD-10-CM | POA: Insufficient documentation

## 2024-02-06 LAB — CBC
HCT: 41.5 % (ref 36.0–46.0)
Hemoglobin: 14.8 g/dL (ref 12.0–15.0)
MCH: 30.2 pg (ref 26.0–34.0)
MCHC: 35.7 g/dL (ref 30.0–36.0)
MCV: 84.7 fL (ref 80.0–100.0)
Platelets: 189 K/uL (ref 150–400)
RBC: 4.9 MIL/uL (ref 3.87–5.11)
RDW: 12.6 % (ref 11.5–15.5)
WBC: 3.7 K/uL — ABNORMAL LOW (ref 4.0–10.5)
nRBC: 0 % (ref 0.0–0.2)

## 2024-02-06 LAB — URINALYSIS, ROUTINE W REFLEX MICROSCOPIC
Bacteria, UA: NONE SEEN
Bilirubin Urine: NEGATIVE
Glucose, UA: NEGATIVE mg/dL
Ketones, ur: NEGATIVE mg/dL
Leukocytes,Ua: NEGATIVE
Nitrite: NEGATIVE
Specific Gravity, Urine: 1.014 (ref 1.005–1.030)
pH: 8 (ref 5.0–8.0)

## 2024-02-06 LAB — COMPREHENSIVE METABOLIC PANEL WITH GFR
ALT: 17 U/L (ref 0–44)
AST: 28 U/L (ref 15–41)
Albumin: 4 g/dL (ref 3.5–5.0)
Alkaline Phosphatase: 66 U/L (ref 38–126)
Anion gap: 7 (ref 5–15)
BUN: 9 mg/dL (ref 8–23)
CO2: 29 mmol/L (ref 22–32)
Calcium: 9.8 mg/dL (ref 8.9–10.3)
Chloride: 94 mmol/L — ABNORMAL LOW (ref 98–111)
Creatinine, Ser: 0.7 mg/dL (ref 0.44–1.00)
GFR, Estimated: >60 mL/min (ref >60)
Glucose, Bld: 121 mg/dL — ABNORMAL HIGH (ref 70–99)
Potassium: 4.1 mmol/L (ref 3.5–5.1)
Sodium: 131 mmol/L — ABNORMAL LOW (ref 135–145)
Total Bilirubin: 0.7 mg/dL (ref 0.0–1.2)
Total Protein: 6.3 g/dL — ABNORMAL LOW (ref 6.5–8.1)

## 2024-02-06 LAB — PRO BRAIN NATRIURETIC PEPTIDE: Pro Brain Natriuretic Peptide: 300 pg/mL — ABNORMAL HIGH (ref <300.0)

## 2024-02-06 LAB — CBG MONITORING, ED: Glucose-Capillary: 118 mg/dL — ABNORMAL HIGH (ref 70–99)

## 2024-02-06 MED ORDER — SODIUM CHLORIDE 0.9 % IV BOLUS
500.0000 mL | Freq: Once | INTRAVENOUS | Status: AC
  Administered 2024-02-06: 500 mL via INTRAVENOUS

## 2024-02-06 MED ORDER — FUROSEMIDE 10 MG/ML IJ SOLN
20.0000 mg | Freq: Once | INTRAMUSCULAR | Status: AC
  Administered 2024-02-06: 20 mg via INTRAVENOUS
  Filled 2024-02-06: qty 2

## 2024-02-06 MED ORDER — IRBESARTAN 150 MG PO TABS
150.0000 mg | ORAL_TABLET | Freq: Every day | ORAL | Status: DC
  Administered 2024-02-06: 150 mg via ORAL
  Filled 2024-02-06: qty 1

## 2024-02-06 NOTE — Discharge Instructions (Addendum)
 You can be discharged home. Please plan to follow up with your doctor for recheck this coming week. If symptoms recur or if you have new symptoms of concern, return to the ED.   Tonight, plan on taking your regular dose of Labetalol  as usual. Do not take the olmesartan . Continue all regular medications in the morning.

## 2024-02-06 NOTE — ED Provider Notes (Signed)
 [place] EMERGENCY DEPARTMENT AT [facility] Provider Note   CSN: [id number] Arrival date & time: 02/06/24  1348     Patient presents with: Dizziness and Hypertension   Tina Ware is a 83 y.o. female.   Patient with history of HTN, CAD, arthritis, presents with symptoms that started this morning with dizziness on waking. She got up from bed and had a large bowel movement. She tried to take her regular medications, including her blood pressure medications, but vomiting x 1 immediately after taking them. All symptoms resolved shortly afterward. No chest pain, SOB, cough. No recent illness or fever. She feels this happened once in the remote past and was related to dehydration. She denies any vomiting or diarrhea other than this morning. She does not feel she drinks enough water but feels she is eating per her usual.   The history is provided by the patient. No language interpreter was used.  Dizziness Hypertension       Prior to Admission medications   Medication Sig Start Date End Date Taking? Authorizing Provider  aspirin  EC 81 MG tablet Take 81 mg by mouth daily.    [provider]  Cholecalciferol  (VITAMIN D) 50 MCG (2000 UT) tablet Take 2,000 Units by mouth daily.     [provider]  furosemide  (LASIX ) 20 MG tablet Take 1 tablet (20 mg total) by mouth daily as needed for fluid. 05/30/23   Jarold Medici, MD  hydrocortisone  (ANUSOL -HC) 2.5 % rectal cream Place 1 Application rectally 2 (two) times daily as needed for hemorrhoids or anal itching. 02/14/23   Jarold Medici, MD  labetalol  (NORMODYNE ) 100 MG tablet TAKE 1 TABLET BY MOUTH TWICE A DAY 08/09/23   Jarold Medici, MD  mometasone  (NASONEX ) 50 MCG/ACT nasal spray PLACE 2 SPRAYS INTO THE NOSE DAILY. 12/04/23   Jarold Medici, MD  olmesartan -hydrochlorothiazide (BENICAR  HCT) 40-25 MG tablet Take 1 tablet by mouth daily. 10/04/23   Jarold Medici, MD  polyethylene glycol (MIRALAX ) 17 g packet Take  17 g by mouth daily as needed. 07/25/22   Jarold Medici, MD    Allergies: Spironolactone     Review of Systems  Neurological:  Positive for dizziness.    Updated Vital Signs BP (!) 183/81   Pulse 84   Temp 98.9 F (37.2 C)   Resp 18   SpO2 100%   Physical Exam Vitals and nursing note reviewed.  Constitutional:      Appearance: Normal appearance. She is well-developed.  HENT:     Head: Normocephalic.  Neck:     Vascular: No carotid bruit.  Cardiovascular:     Rate and Rhythm: Normal rate and regular rhythm.     Heart sounds: No murmur heard. Pulmonary:     Effort: Pulmonary effort is normal.     Breath sounds: Normal breath sounds. No wheezing, rhonchi or rales.  Abdominal:     General: Bowel sounds are normal.     Palpations: Abdomen is soft.     Tenderness: There is no abdominal tenderness. There is no guarding or rebound.  Musculoskeletal:        General: Normal range of motion.     Cervical back: Normal range of motion and neck supple.     Right lower leg: Edema present.     Left lower leg: Edema present.  Skin:    General: Skin is warm and dry.  Neurological:     General: No focal deficit present.     Mental Status: She is  alert and oriented to person, place, and time.     (all labs ordered are listed, but only abnormal results are displayed) Labs Reviewed  COMPREHENSIVE METABOLIC PANEL WITH GFR - Abnormal; Notable for the following components:      Result Value   Sodium 131 (*)    Chloride 94 (*)    Glucose, Bld 121 (*)    Total Protein 6.3 (*)    All other components within normal limits  CBC - Abnormal; Notable for the following components:   WBC 3.7 (*)    All other components within normal limits  URINALYSIS, ROUTINE W REFLEX MICROSCOPIC - Abnormal; Notable for the following components:   APPearance HAZY (*)    Hgb urine dipstick SMALL (*)    Protein, ur TRACE (*)    All other components within normal limits  PRO BRAIN NATRIURETIC PEPTIDE -  Abnormal; Notable for the following components:   Pro Brain Natriuretic Peptide 300.0 (*)    All other components within normal limits  CBG MONITORING, ED - Abnormal; Notable for the following components:   Glucose-Capillary 118 (*)    All other components within normal limits    EKG: EKG Interpretation Date/Time:  Tuesday February 06 2024 13:57:42 EST Ventricular Rate:  83 PR Interval:  180 QRS Duration:  80 QT Interval:  362 QTC Calculation: 425 R Axis:   -31  Text Interpretation: Normal sinus rhythm Left axis deviation Abnormal ECG No significant change since last tracing Confirmed by Ellouise Fine ([id number]) on 02/06/2024 4:15:35 PM  Radiology: [facility] Chest Portable 1 View Result Date: 02/06/2024 CLINICAL DATA:  Peripheral edema. EXAM: PORTABLE CHEST 1 VIEW COMPARISON:  Chest radiograph dated 04/29/2017 FINDINGS: No focal consolidation, pleural effusion, pneumothorax. The cardiac silhouette is within normal limits. No acute osseous pathology. IMPRESSION: No active disease. Electronically Signed   By: Vanetta Chou M.D.   On: 02/06/2024 16:43     Procedures   Medications Ordered in the ED  irbesartan (AVAPRO) tablet 150 mg (150 mg Oral Given 02/06/24 1635)  sodium chloride  0.9 % bolus 500 mL (500 mLs Intravenous New Bag/Given 02/06/24 1632)  furosemide  (LASIX ) injection 20 mg (20 mg Intravenous Given 02/06/24 1640)    Clinical Course as of 02/06/24 1741  Tue Feb 06, 2024  1650 Patient to ED with ss/sxs as per HPI. Currently asymptomatic but concerned about her blood pressure and has been afraid to take more medication.   She is overall well appearing. She has 2-3+ pitting edema, currently wearing compression stockings. A little worse than usual. She has Lasix  20 mg that she take as needed and has not taken it recently. IV 20 mg Lasix  ordered in ED.   Labs showing mild hyponatremia, Cl low at 94. Normal renal function. Urine negative for infection or significant  concentration. Normal hemoglobin. EKG: EKG Interpretation Date/Time:  Tuesday February 06 2024 13:57:42 EST Ventricular Rate:  83 PR Interval:  180 QRS Duration:  80 QT Interval:  362 QTC Calculation: 425 R Axis:   -31  Text Interpretation: Normal sinus rhythm Left axis deviation Abnormal ECG No significant change since last tracing Confirmed by Ellouise Fine ([id number]) on 02/06/2024 4:15:35 PM  Will obtain CXR, BNP. Formulary equivalent of her olmesartan  provided in the ED. Will continue to monitor blood pressure, last checked 191/88 which is improved from arrival.  [SU]  1735 Patient continues to be asymptomatic in the ED. She received gentle hydration of 500 cc fluids. BNP 300, no pulmonary edema. Lasix  given with  adequate urination in ED. Blood pressure 183/81.   Discussed with Dr. Ellouise. She is stable for discharge home. Patient is comfortable with plan. She will continue regular Labetalol  dosing tonight. Close PCP follow up.  [SU]    Clinical Course User Index [SU] Odell Balls, PA-C                                 Medical Decision Making Amount and/or Complexity of Data Reviewed Labs: ordered. Radiology: ordered.  Risk Prescription drug management.        Final diagnoses:  Hypertension, unspecified type    ED Discharge Orders     None          Odell Balls, PA-C 02/06/24 1741    Ellouise Richerd POUR, DO 02/06/24 1936

## 2024-02-06 NOTE — ED Triage Notes (Signed)
 Patient reports awakening with dizziness and high blood pressure at home. Took her blood pressure medication but vomited it up. Denies vision changes, numbness, tingling, weakness, or any other neuro deficits. [place] this happened in the past and was diagnosed with dehydration.

## 2024-02-07 ENCOUNTER — Encounter: Payer: Self-pay | Admitting: Family Medicine

## 2024-02-07 ENCOUNTER — Ambulatory Visit: Admitting: Family Medicine

## 2024-02-07 VITALS — BP 144/80 | HR 77 | Temp 98.4°F | Wt 175.0 lb

## 2024-02-07 DIAGNOSIS — I110 Hypertensive heart disease with heart failure: Secondary | ICD-10-CM | POA: Diagnosis not present

## 2024-02-07 DIAGNOSIS — I5032 Chronic diastolic (congestive) heart failure: Secondary | ICD-10-CM

## 2024-02-07 NOTE — Patient Instructions (Signed)
 Hypertension, Adult Hypertension is another name for high blood pressure. High blood pressure forces your heart to work harder to pump blood. This can cause problems over time. There are two numbers in a blood pressure reading. There is a top number (systolic) over a bottom number (diastolic). It is best to have a blood pressure that is below 120/80. What are the causes? The cause of this condition is not known. Some other conditions can lead to high blood pressure. What increases the risk? Some lifestyle factors can make you more likely to develop high blood pressure: Smoking. Not getting enough exercise or physical activity. Being overweight. Having too much fat, sugar, calories, or salt (sodium) in your diet. Drinking too much alcohol. Other risk factors include: Having any of these conditions: Heart disease. Diabetes. High cholesterol. Kidney disease. Obstructive sleep apnea. Having a family history of high blood pressure and high cholesterol. Age. The risk increases with age. Stress. What are the signs or symptoms? High blood pressure may not cause symptoms. Very high blood pressure (hypertensive crisis) may cause: Headache. Fast or uneven heartbeats (palpitations). Shortness of breath. Nosebleed. Vomiting or feeling like you may vomit (nauseous). Changes in how you see. Very bad chest pain. Feeling dizzy. Seizures. How is this treated? This condition is treated by making healthy lifestyle changes, such as: Eating healthy foods. Exercising more. Drinking less alcohol. Your doctor may prescribe medicine if lifestyle changes do not help enough and if: Your top number is above 130. Your bottom number is above 80. Your personal target blood pressure may vary. Follow these instructions at home: Eating and drinking  If told, follow the DASH eating plan. To follow this plan: Fill one half of your plate at each meal with fruits and vegetables. Fill one fourth of your plate  at each meal with whole grains. Whole grains include whole-wheat pasta, brown rice, and whole-grain bread. Eat or drink low-fat dairy products, such as skim milk or low-fat yogurt. Fill one fourth of your plate at each meal with low-fat (lean) proteins. Low-fat proteins include fish, chicken without skin, eggs, beans, and tofu. Avoid fatty meat, cured and processed meat, or chicken with skin. Avoid pre-made or processed food. Limit the amount of salt in your diet to less than 1,500 mg each day. Do not drink alcohol if: Your doctor tells you not to drink. You are pregnant, may be pregnant, or are planning to become pregnant. If you drink alcohol: Limit how much you have to: 0-1 drink a day for women. 0-2 drinks a day for men. Know how much alcohol is in your drink. In the [place] one drink equals one 12 oz bottle of beer (355 mL), one 5 oz glass of wine (148 mL), or one 1 oz glass of hard liquor (44 mL). Lifestyle  Work with your doctor to stay at a healthy weight or to lose weight. Ask your doctor what the best weight is for you. Get at least 30 minutes of exercise that causes your heart to beat faster (aerobic exercise) most days of the week. This may include walking, swimming, or biking. Get at least 30 minutes of exercise that strengthens your muscles (resistance exercise) at least 3 days a week. This may include lifting weights or doing Pilates. Do not smoke or use any products that contain nicotine or tobacco. If you need help quitting, ask your doctor. Check your blood pressure at home as told by your doctor. Keep all follow-up visits. Medicines Take over-the-counter and prescription medicines  only as told by your doctor. Follow directions carefully. Do not skip doses of blood pressure medicine. The medicine does not work as well if you skip doses. Skipping doses also puts you at risk for problems. Ask your doctor about side effects or reactions to medicines that you should watch  for. Contact a doctor if: You think you are having a reaction to the medicine you are taking. You have headaches that keep coming back. You feel dizzy. You have swelling in your ankles. You have trouble with your vision. Get help right away if: You get a very bad headache. You start to feel mixed up (confused). You feel weak or numb. You feel faint. You have very bad pain in your: Chest. Belly (abdomen). You vomit more than once. You have trouble breathing. These symptoms may be an emergency. Get help right away. Call 911. Do not wait to see if the symptoms will go away. Do not drive yourself to the hospital. Summary Hypertension is another name for high blood pressure. High blood pressure forces your heart to work harder to pump blood. For most people, a normal blood pressure is less than 120/80. Making healthy choices can help lower blood pressure. If your blood pressure does not get lower with healthy choices, you may need to take medicine. This information is not intended to replace advice given to you by your health care provider. Make sure you discuss any questions you have with your health care provider. Document Revised: 12/31/2020 Document Reviewed: 12/31/2020 Elsevier Patient Education  2024 [employer].

## 2024-02-07 NOTE — Progress Notes (Signed)
 I,Jameka J Llittleton, CMA,acting as a neurosurgeon for [employer], NP.,have documented all relevant documentation on the behalf of Bruna Creighton, NP,as directed by  Bruna Creighton, NP while in the presence of Bruna Creighton, NP.  Subjective:  [name] , female    DOB: 1940/05/24 , 83 y.o.   MRN: [id number]  Chief Complaint  Patient presents with   ER f/u    Patient presents today for a ER f/u. Patient reports she went to the ER yesterday for dizziness, diarrhea and vomiting. Patient reports she is feeling a little bit better but she does feel like she has a cold.     HPI Discussed the use of AI scribe software for clinical note transcription with the patient, who gave verbal consent to proceed.  History of Present Illness     Tina Ware is an 83 year old female with congestive heart failure who presents for ER follow-up after elevated blood pressure and fluid retention.  She visited the emergency room yesterday due to elevated blood pressure, dizziness, and diarrhea. She had two large bowel movements but states that her stools were not loose. After taking her medication, she experienced regurgitation and was unable to keep the medication down. She later fell asleep, and upon waking, the dizziness had resolved, but her blood pressure remained high, prompting her ER visit.  She reported that in the emergency room, she was told she was somewhat dehydrated and had fluid buildup in her legs. She received intravenous Lasix  and medication for her blood pressure. Her legs were more swollen yesterday compared to today, and she was administered Lasix  intravenously in the hospital. She has Lasix  at home, which she takes as needed, but not daily. Her current dose is 20 mg as needed for fluid retention.  She has a history of congestive heart failure. No shortness of breath. When fluid retention is significant, she experiences numbness in her thighs and a heavy feeling in her legs. She is  concerned about the swelling in her legs and ankles, which she describes as tight and uncomfortable.  Her social history includes working as a lawyer, which impacts her ability to take Lasix  during work hours due to the need for frequent bathroom breaks. She is currently off work and plans to take Lasix  today. She is considering a trip to [place], [place] , and is weighing the decision based on her current condition and the potential for increased swelling during travel.  Advised patient to take Lasix  for the next few days due to swelling in her feet.       Past Medical History:  Diagnosis Date   Arthritis    RA   CAD (coronary artery disease)    Cataract 06/ 2020   Cataracts in June and July 2020   Clotting disorder 09/ 2020   Occasional hemorrhoidal bleeding   Encounter for gynecological examination (general) (routine) with abnormal findings 02/14/2023   Fibroid, uterine    Hypertension    Currently taking medications   Localized osteoporosis without current pathological fracture 06/10/2023     Family History  Problem Relation Age of Onset   Heart disease Mother    Dementia Mother    Congestive Heart Failure Mother    Arthritis Mother    COPD Mother    Hypertension Mother    Parkinson's disease Father    Asthma Father    Pancreatic cancer Sister    Asthma Sister    Cancer Sister  Current Outpatient Medications:    aspirin  EC 81 MG tablet, Take 81 mg by mouth daily., Disp: , Rfl:    Cholecalciferol  (VITAMIN D) 50 MCG (2000 UT) tablet, Take 2,000 Units by mouth daily. , Disp: , Rfl:    furosemide  (LASIX ) 20 MG tablet, Take 1 tablet (20 mg total) by mouth daily as needed for fluid., Disp: 90 tablet, Rfl: 2   hydrocortisone  (ANUSOL -HC) 2.5 % rectal cream, Place 1 Application rectally 2 (two) times daily as needed for hemorrhoids or anal itching., Disp: 30 g, Rfl: 2   labetalol  (NORMODYNE ) 100 MG tablet, TAKE 1 TABLET BY MOUTH TWICE A DAY, Disp: 180 tablet,  Rfl: 3   mometasone  (NASONEX ) 50 MCG/ACT nasal spray, PLACE 2 SPRAYS INTO THE NOSE DAILY., Disp: 51 each, Rfl: 1   olmesartan -hydrochlorothiazide (BENICAR  HCT) 40-25 MG tablet, Take 1 tablet by mouth daily., Disp: 90 tablet, Rfl: 1   polyethylene glycol (MIRALAX ) 17 g packet, Take 17 g by mouth daily as needed., Disp: 30 each, Rfl: 3   Allergies  Allergen Reactions   Spironolactone  Other (See Comments)    Hyponatremia     Review of Systems  Constitutional: Negative.   Respiratory: Negative.    Cardiovascular:  Positive for leg swelling. Negative for chest pain and palpitations.  Gastrointestinal: Negative.   Genitourinary: Negative.   Neurological: Negative.   Hematological: Negative.      Today's Vitals   02/07/24 1147  BP: (!) 144/80  Pulse: 77  Temp: 98.4 F (36.9 C)  TempSrc: Oral  Weight: 175 lb (79.4 kg)  PainSc: 0-No pain   Body mass index is 31 kg/m.  Wt Readings from Last 3 Encounters:  02/07/24 175 lb (79.4 kg)  10/20/23 174 lb (78.9 kg)  10/04/23 174 lb 12.8 oz (79.3 kg)    The ASCVD Risk score (Arnett DK, et al., 2019) failed to calculate for the following reasons:   The 2019 ASCVD risk score is only valid for ages 3 to 13  Objective:  Physical Exam Constitutional:      Appearance: Normal appearance.  Cardiovascular:     Rate and Rhythm: Normal rate and regular rhythm.     Pulses: Normal pulses.     Heart sounds: Normal heart sounds.  Pulmonary:     Effort: Pulmonary effort is normal.     Breath sounds: Normal breath sounds.  Abdominal:     General: Bowel sounds are normal.  Musculoskeletal:     Right lower leg: 1+ Pitting Edema present.     Left lower leg: 1+ Pitting Edema present.  Neurological:     Mental Status: She is alert and oriented to person, place, and time.         Assessment And Plan:   Assessment & Plan Hypertensive heart disease with chronic diastolic congestive heart failure (HCC) Fluid overload with lower extremity  edema. Congestive heart failure with fluid retention. Discussed risks of fluid overload and importance of fluid management. - Take Lasix  20 mg PRN for fluid management. - Use compression socks during travel. - Take breaks and walk during travel. - Denies shortness of breath Keep follow up appointment    Return if symptoms worsen or fail to improve, for keep next appt.  Patient was given opportunity to ask questions. Patient verbalized understanding of the plan and was able to repeat key elements of the plan. All questions were answered to their satisfaction.   I, Bruna Creighton, NP, have reviewed all documentation for this visit. The documentation  on 02/18/2024 for the exam, diagnosis, procedures, and orders are all accurate and complete.     IF YOU HAVE BEEN REFERRED TO A SPECIALIST, IT MAY TAKE 1-2 WEEKS TO SCHEDULE/PROCESS THE REFERRAL. IF YOU HAVE NOT HEARD FROM US /SPECIALIST IN TWO WEEKS, PLEASE GIVE US  A CALL AT [phone].

## 2024-02-08 ENCOUNTER — Telehealth: Payer: Self-pay

## 2024-02-08 NOTE — Telephone Encounter (Signed)
 I received a page stating that the patient called Tina Ware cardiology in the evening of 02/08/2024 noting that her blood pressure before taking her medications was 187/119 and then 105/87 after taking her medications. I tried calling her back twice with no response, and left a voicemail advising her to go the ED if she is symptomatic or call us  back to discuss.

## 2024-02-09 ENCOUNTER — Telehealth: Payer: Self-pay | Admitting: Cardiology

## 2024-02-09 ENCOUNTER — Ambulatory Visit: Payer: Self-pay

## 2024-02-09 NOTE — Telephone Encounter (Signed)
 Pt c/o BP issue: STAT if pt c/o blurred vision, one-sided weakness or slurred speech.  STAT if BP is GREATER than 180/120 TODAY.  STAT if BP is LESS than 90/60 and SYMPTOMATIC TODAY  1. What is your BP concern? Elevated BP  2. Have you taken any BP medication today? Yes  3. What are your last 5 BP readings? 150/84 - Today 150/85 - Today 175/113 - Last Night   4. Are you having any other symptoms (ex. Dizziness, headache, blurred vision, passed out)? No

## 2024-02-09 NOTE — Telephone Encounter (Signed)
 [facility] calling to report the pts current BP 181/111

## 2024-02-09 NOTE — Telephone Encounter (Signed)
 FYI Only or Action Required?: Action required by provider: clinical question for provider and update on patient condition.  Patient was last seen in primary care on 02/07/2024 by Tina Pries, NP.  Called Nurse Triage reporting Hypertension.  Symptoms began has been elevated more often recently.  Interventions attempted: Prescription medications: Has been taking her blood pressure medication.  Symptoms are: unchanged.  Triage Disposition: See PCP When Office is Open (Within 3 Days)  Patient/caregiver understands and will follow disposition?: No, wishes to speak with PCP      Copied from CRM #[id number]. Topic: Clinical - Red Word Triage >> Feb 09, 2024  2:54 PM Tina Ware wrote: Red Word that prompted transfer to Nurse Triage: Patient by today was 175/113 and 175/110. The patient has no current symptoms      Reason for Disposition  Systolic BP >= 160 OR Diastolic >= 100  Answer Assessment - Initial Assessment Questions Patient reports she is experiencing some swelling in her hands and feet and has not taken her Lasix . I advised her she can take her Lasix  for that and that it might also help with her blood pressure. Patient would like to know if there is any other advice for her elevated blood pressures. Patient advised to call back if her blood pressure does not improve.      1. BLOOD PRESSURE: What is your blood pressure? Did you take at least two measurements 5 minutes apart?     175/113 and 175/105, while on the phone the patient checked her blood pressure and it was 170/106 2. ONSET: When did you take your blood pressure?     Today  3. HOW: How did you take your blood pressure? (e.g., automatic home BP monitor, visiting nurse)     Automatic BP cuff 4. HISTORY: Do you have a history of high blood pressure?     Yes 5. MEDICINES: Are you taking any medicines for blood pressure? Have you missed any doses recently?     Has taken her blood pressure medication  but has not taken her Lasix   6. OTHER SYMPTOMS: Do you have any symptoms? (e.g., blurred vision, chest pain, difficulty breathing, headache, weakness)     Some swelling of her hands and feet  Protocols used: Blood Pressure - High-A-AH

## 2024-02-09 NOTE — Telephone Encounter (Signed)
 Left message to call back.

## 2024-02-09 NOTE — Telephone Encounter (Signed)
 This RN contacted cardiologist office as they have also been in communication with patient. Reviewed pt report below and was told a nurse would call the pt shortly.  Patient denies higher acuity questions. ED precautions reviewed, pt verbalized understanding.  FYI Only or Action Required?: Action required by provider: update on patient condition.  Patient was last seen in primary care on 02/07/2024 by Petrina Pries, NP.  Called Nurse Triage reporting Advice Only.  Symptoms began ongoing  Interventions attempted: Prescription medications: antihypertensives and lasix .  Symptoms are: unchanged.  Triage Disposition: See Physician Within 24 Hours  Patient/caregiver understands and will follow disposition?:  Reason for Disposition  Systolic BP >= 180 OR Diastolic >= 110  Answer Assessment - Initial Assessment Questions Pt states she took her lasix  around 1530 today after speaking to triage nurse. Pt reports BP is currently 181/111. States she is experiencing a sinus headache, rated 3/10. Denies blurry vision or weakness.   1. BLOOD PRESSURE: What is your blood pressure? Did you take at least two measurements 5 minutes apart?     *No Answer* 2. ONSET: When did you take your blood pressure?     *No Answer* 3. HOW: How did you take your blood pressure? (e.g., automatic home BP monitor, visiting nurse)     *No Answer* 4. HISTORY: Do you have a history of high blood pressure?     *No Answer* 5. MEDICINES: Are you taking any medicines for blood pressure? Have you missed any doses recently?     *No Answer* 6. OTHER SYMPTOMS: Do you have any symptoms? (e.g., blurred vision, chest pain, difficulty breathing, headache, weakness)     *No Answer* 7. PREGNANCY: Is there any chance you are pregnant? When was your last menstrual period?     *No Answer*  Protocols used: Blood Pressure - High-A-AH Copied from CRM #[id number]. Topic: Clinical - Medication Question >> Feb 09, 2024   2:47 PM Antwanette L wrote: Reason for CRM: The patient is calling with questions about her furosemide  (Lasix ) 20 mg tablets. She wants to know whether she should be taking them daily, as she has been experiencing frequent urination. The patient is requesting a callback at [phone]

## 2024-02-18 NOTE — Assessment & Plan Note (Signed)
 Fluid overload with lower extremity edema. Congestive heart failure with fluid retention. Discussed risks of fluid overload and importance of fluid management. - Take Lasix  20 mg PRN for fluid management. - Use compression socks during travel. - Take breaks and walk during travel. - Denies shortness of breath Keep follow up appointment

## 2024-02-19 ENCOUNTER — Ambulatory Visit (INDEPENDENT_AMBULATORY_CARE_PROVIDER_SITE_OTHER): Payer: Self-pay | Admitting: Internal Medicine

## 2024-02-19 ENCOUNTER — Encounter: Payer: Self-pay | Admitting: Internal Medicine

## 2024-02-19 VITALS — BP 128/80 | Temp 99.1°F | Ht 63.0 in | Wt 170.4 lb

## 2024-02-19 DIAGNOSIS — R600 Localized edema: Secondary | ICD-10-CM

## 2024-02-19 DIAGNOSIS — I5032 Chronic diastolic (congestive) heart failure: Secondary | ICD-10-CM

## 2024-02-19 DIAGNOSIS — H6123 Impacted cerumen, bilateral: Secondary | ICD-10-CM | POA: Diagnosis not present

## 2024-02-19 DIAGNOSIS — I110 Hypertensive heart disease with heart failure: Secondary | ICD-10-CM

## 2024-02-19 DIAGNOSIS — Z0000 Encounter for general adult medical examination without abnormal findings: Secondary | ICD-10-CM | POA: Diagnosis not present

## 2024-02-19 NOTE — Assessment & Plan Note (Addendum)
 Edema in lower extremities with suspicion of venous insufficiency. Previous vein specialist consultation noted. Discussed potential venous reflux and fluid pooling. - Scheduled appointment with vascular specialist for ultrasound of legs. - Elevate feet and use compression hose as tolerated. - Take furosemide  MWF

## 2024-02-19 NOTE — Progress Notes (Signed)
 I,Victoria T Emmitt, CMA,acting as a neurosurgeon for [name], MD.,have documented all relevant documentation on the behalf of [name], MD,as directed by  [name], MD while in the presence of [name], MD.  Subjective:    Patient ID: Tina  LELON Ware , female    DOB: 08/30/1940 , 83 y.o.   MRN: [id number]  Chief Complaint  Patient presents with   Annual Exam    She is here today for a full physical examination.  She is no longer followed by GYN. She reports compliance with meds. Denies headaches, chest pain and shortness of breath.   She reports going to ED on 11/11 for elevated bp.   Hypertension    HPI Discussed the use of AI scribe software for clinical note transcription with the patient, who gave verbal consent to proceed.  History of Present Illness Tina Ware is an 83 year old female with hypertension who presents for a physical and blood pressure check.  She recently visited the emergency room on November 11th due to a significant increase in blood pressure, which had not been that high in a long time. She experienced a large bowel movement followed by vomiting, leading to regurgitation of her medications. In the ER, she was administered additional medication, including Lasix . Despite this, her blood pressure remained elevated for three days, during which she felt jittery.  She currently takes labetalol  100 mg twice a day and olmesartan  40/25 mg, which she has adjusted to taking half a tablet twice a day due to feeling jittery and experiencing increased heart rate with the full dose. She also takes Lasix  as needed, typically one and a half tablets in the morning and half in the evening. Her recent blood pressure readings have been under 150, with many days showing readings of 128/80s.  She denies adding salt to her food and mentions her sodium was slightly low a few weeks ago. She reports normal liver function and a normal A1c from previous tests. She  experiences allergy symptoms in the fall, which she initially mistook for a cold. She mentions difficulty wearing compression hose regularly due to challenges in putting them on.  She is a lawyer and leads an active lifestyle, which impacts her ability to take Lasix  consistently. She last took Lasix  on Friday and plans to take it today. She has not been seen by a vein specialist recently and is open to further evaluation for potential venous insufficiency. She is due for a mammogram in February and does not perform self-breast exams. Her bowel movements occur almost daily.   Hypertension This is a chronic problem. The current episode started more than 1 year ago. The problem has been gradually improving since onset. The problem is controlled. Pertinent negatives include no blurred vision. Risk factors for coronary artery disease include obesity, post-menopausal state and sedentary lifestyle. Past treatments include angiotensin blockers and diuretics. The current treatment provides moderate improvement. Compliance problems include exercise.      Past Medical History:  Diagnosis Date   Arthritis    RA   CAD (coronary artery disease)    Cataract 06/ 2020   Cataracts in June and July 2020   Clotting disorder 09/ 2020   Occasional hemorrhoidal bleeding   Encounter for gynecological examination (general) (routine) with abnormal findings 02/14/2023   Fibroid, uterine    Hypertension    Currently taking medications   Localized osteoporosis without current pathological fracture 06/10/2023     Family  History  Problem Relation Age of Onset   Heart disease Mother    Dementia Mother    Congestive Heart Failure Mother    Arthritis Mother    COPD Mother    Hypertension Mother    Parkinson's disease Father    Asthma Father    Pancreatic cancer Sister    Asthma Sister    Cancer Sister      Current Outpatient Medications:    aspirin  EC 81 MG tablet, Take 81 mg by mouth daily.,  Disp: , Rfl:    Cholecalciferol  (VITAMIN D) 50 MCG (2000 UT) tablet, Take 2,000 Units by mouth daily. , Disp: , Rfl:    furosemide  (LASIX ) 20 MG tablet, Take 1 tablet (20 mg total) by mouth daily as needed for fluid., Disp: 90 tablet, Rfl: 2   hydrocortisone  (ANUSOL -HC) 2.5 % rectal cream, Place 1 Application rectally 2 (two) times daily as needed for hemorrhoids or anal itching., Disp: 30 g, Rfl: 2   labetalol  (NORMODYNE ) 100 MG tablet, TAKE 1 TABLET BY MOUTH TWICE A DAY, Disp: 180 tablet, Rfl: 3   mometasone  (NASONEX ) 50 MCG/ACT nasal spray, PLACE 2 SPRAYS INTO THE NOSE DAILY., Disp: 51 each, Rfl: 1   olmesartan -hydrochlorothiazide (BENICAR  HCT) 40-25 MG tablet, Take 1 tablet by mouth daily., Disp: 90 tablet, Rfl: 1   polyethylene glycol (MIRALAX ) 17 g packet, Take 17 g by mouth daily as needed. (Patient not taking: Reported on 02/19/2024), Disp: 30 each, Rfl: 3   Allergies  Allergen Reactions   Spironolactone  Other (See Comments)    Hyponatremia      The patient states she uses post menopausal status for birth control. No LMP recorded. Patient is postmenopausal.. Negative for Dysmenorrhea. Negative for: breast discharge, breast lump(s), breast pain and breast self exam. Associated symptoms include abnormal vaginal bleeding. Pertinent negatives include abnormal bleeding (hematology), anxiety, decreased libido, depression, difficulty falling sleep, dyspareunia, history of infertility, nocturia, sexual dysfunction, sleep disturbances, urinary incontinence, urinary urgency, vaginal discharge and vaginal itching. Diet regular.The patient states her exercise level is    . The patient's tobacco use is:  Social History   Tobacco Use  Smoking Status Never   Passive exposure: Past  Smokeless Tobacco Never  Tobacco Comments   I never smoked.  . She has been exposed to passive smoke. The patient's alcohol use is:  Social History   Substance and Sexual Activity  Alcohol Use No    Review of  Systems  Constitutional: Negative.   HENT: Negative.    Eyes: Negative.  Negative for blurred vision.  Respiratory: Negative.    Cardiovascular: Negative.   Gastrointestinal: Negative.   Endocrine: Negative.   Genitourinary: Negative.   Musculoskeletal: Negative.   Skin: Negative.   Allergic/Immunologic: Negative.   Neurological: Negative.   Hematological: Negative.   Psychiatric/Behavioral: Negative.       Today's Vitals   02/19/24 0848  BP: 128/80  Temp: 99.1 F (37.3 C)  SpO2: 98%  Weight: 170 lb 6.4 oz (77.3 kg)  Height: 5' 3 (1.6 m)   Body mass index is 30.19 kg/m.  Wt Readings from Last 3 Encounters:  02/19/24 170 lb 6.4 oz (77.3 kg)  02/07/24 175 lb (79.4 kg)  10/20/23 174 lb (78.9 kg)     Objective:  Physical Exam Vitals and nursing note reviewed.  Constitutional:      Appearance: Normal appearance.  HENT:     Head: Normocephalic and atraumatic.     Right Ear: Ear canal and external ear  normal. There is impacted cerumen.     Left Ear: Ear canal and external ear normal. There is impacted cerumen.     Nose: Nose normal.     Mouth/Throat:     Mouth: Mucous membranes are moist.     Pharynx: Oropharynx is clear.  Eyes:     Extraocular Movements: Extraocular movements intact.     Conjunctiva/sclera: Conjunctivae normal.     Pupils: Pupils are equal, round, and reactive to light.  Cardiovascular:     Rate and Rhythm: Normal rate and regular rhythm.     Pulses: Normal pulses.     Heart sounds: Normal heart sounds.  Pulmonary:     Effort: Pulmonary effort is normal.     Breath sounds: Normal breath sounds.  Abdominal:     General: Bowel sounds are normal.     Palpations: Abdomen is soft.  Genitourinary:    Comments: Deferred per patient Musculoskeletal:        General: Normal range of motion.     Cervical back: Normal range of motion and neck supple.     Right lower leg: Edema present.     Left lower leg: Edema present.  Skin:    General: Skin is  warm and dry.  Neurological:     General: No focal deficit present.     Mental Status: She is alert and oriented to person, place, and time.  Psychiatric:        Mood and Affect: Mood normal.        Behavior: Behavior normal.         Assessment And Plan:     Routine general medical examination at health care facility Assessment & Plan: A full exam was performed.  Importance of monthly self breast exams was discussed with the patient.  She is advised to get 30-45 minutes of regular exercise, no less than four to five days per week. Both weight-bearing and aerobic exercises are recommended.  She is advised to follow a healthy diet with at least six fruits/veggies per day, decrease intake of red meat and other saturated fats and to increase fish intake to twice weekly.  Meats/fish should not be fried -- baked, boiled or broiled is preferable. It is also important to cut back on your sugar intake.  Be sure to read labels - try to avoid anything with added sugar, high fructose corn syrup or other sweeteners.  If you must use a sweetener, you can try stevia or monkfruit.  It is also important to avoid artificially sweetened foods/beverages and diet drinks. Lastly, wear SPF 50 sunscreen on exposed skin and when in direct sunlight for an extended period of time.  Be sure to avoid fast food restaurants and aim for at least 60 ounces of water daily.    - Scheduled next year's physical. - Ensure annual eye exam due to hypertension risk for glaucoma. - Continue mammogram and bone density screenings as scheduled.   Hypertensive heart disease with chronic diastolic congestive heart failure (HCC) Assessment & Plan: Recent hypertensive episode with jitteriness and elevated blood pressure. Current regimen includes labetalol  and olmesartan . Blood pressure readings variable. Sodium levels low, liver function normal. EKG performed. Discussed dietary sodium intake impact on blood pressure. - Continue labetalol   100 mg twice daily. - Adjusted olmesartan  to half tablet twice daily. - Initiated Lasix  on a regular schedule (Monday, Wednesday, Friday). - Check potassium levels in two weeks. - Recheck thyroid  function. - Monitor blood pressure regularly. - Advised on  low sodium diet.  Orders: -     Microalbumin / creatinine urine ratio -     EKG 12-Lead -     Lipid panel -     BMP8+EGFR -     TSH  Bilateral impacted cerumen Assessment & Plan: She gave verbal consent for bilateral ear irrigation, then changed her mind.    Peripheral edema Assessment & Plan: Edema in lower extremities with suspicion of venous insufficiency. Previous vein specialist consultation noted. Discussed potential venous reflux and fluid pooling. - Scheduled appointment with vascular specialist for ultrasound of legs. - Elevate feet and use compression hose as tolerated. - Take furosemide  MWF  Orders: -     Ambulatory referral to Vascular Surgery   Return in 2 weeks (on 03/04/2024), or NV bp check, lab visit check bmp dx: z79.899, for 1 YEAR HM,  4 MONTH BPC. Patient was given opportunity to ask questions. Patient verbalized understanding of the plan and was able to repeat key elements of the plan. All questions were answered to their satisfaction.    I, [name], MD, have reviewed all documentation for this visit. The documentation on 02/19/24 for the exam, diagnosis, procedures, and orders are all accurate and complete.

## 2024-02-19 NOTE — Telephone Encounter (Signed)
 Spoke to patient she stated she saw PCP and B/P is back to normal.

## 2024-02-19 NOTE — Assessment & Plan Note (Signed)
 Recent hypertensive episode with jitteriness and elevated blood pressure. Current regimen includes labetalol  and olmesartan . Blood pressure readings variable. Sodium levels low, liver function normal. EKG performed. Discussed dietary sodium intake impact on blood pressure. - Continue labetalol  100 mg twice daily. - Adjusted olmesartan  to half tablet twice daily. - Initiated Lasix  on a regular schedule (Monday, Wednesday, Friday). - Check potassium levels in two weeks. - Recheck thyroid  function. - Monitor blood pressure regularly. - Advised on low sodium diet.

## 2024-02-19 NOTE — Assessment & Plan Note (Addendum)
 She gave verbal consent for bilateral ear irrigation, then changed her mind.

## 2024-02-19 NOTE — Patient Instructions (Signed)

## 2024-02-19 NOTE — Assessment & Plan Note (Addendum)
 A full exam was performed.  Importance of monthly self breast exams was discussed with the patient.  She is advised to get 30-45 minutes of regular exercise, no less than four to five days per week. Both weight-bearing and aerobic exercises are recommended.  She is advised to follow a healthy diet with at least six fruits/veggies per day, decrease intake of red meat and other saturated fats and to increase fish intake to twice weekly.  Meats/fish should not be fried -- baked, boiled or broiled is preferable. It is also important to cut back on your sugar intake.  Be sure to read labels - try to avoid anything with added sugar, high fructose corn syrup or other sweeteners.  If you must use a sweetener, you can try stevia or monkfruit.  It is also important to avoid artificially sweetened foods/beverages and diet drinks. Lastly, wear SPF 50 sunscreen on exposed skin and when in direct sunlight for an extended period of time.  Be sure to avoid fast food restaurants and aim for at least 60 ounces of water daily.    - Scheduled next year's physical. - Ensure annual eye exam due to hypertension risk for glaucoma. - Continue mammogram and bone density screenings as scheduled.

## 2024-02-20 ENCOUNTER — Ambulatory Visit: Payer: Self-pay | Admitting: Internal Medicine

## 2024-02-20 LAB — BMP8+EGFR
BUN/Creatinine Ratio: 16 (ref 12–28)
BUN: 12 mg/dL (ref 8–27)
CO2: 27 mmol/L (ref 20–29)
Calcium: 9.7 mg/dL (ref 8.7–10.3)
Chloride: 91 mmol/L — ABNORMAL LOW (ref 96–106)
Creatinine, Ser: 0.77 mg/dL (ref 0.57–1.00)
Glucose: 97 mg/dL (ref 70–99)
Potassium: 4.3 mmol/L (ref 3.5–5.2)
Sodium: 130 mmol/L — ABNORMAL LOW (ref 134–144)
eGFR: 77 mL/min/1.73 (ref >59)

## 2024-02-20 LAB — LIPID PANEL
Chol/HDL Ratio: 2.2 ratio (ref 0.0–4.4)
Cholesterol, Total: 179 mg/dL (ref 100–199)
HDL: 82 mg/dL (ref >39)
LDL Chol Calc (NIH): 87 mg/dL (ref 0–99)
Triglycerides: 47 mg/dL (ref 0–149)
VLDL Cholesterol Cal: 10 mg/dL (ref 5–40)

## 2024-02-20 LAB — MICROALBUMIN / CREATININE URINE RATIO
Creatinine, Urine: 99.3 mg/dL
Microalb/Creat Ratio: 4 mg/g{creat} (ref 0–29)
Microalbumin, Urine: 4.2 ug/mL

## 2024-02-20 LAB — TSH: TSH: 1.79 u[IU]/mL (ref 0.450–4.500)

## 2024-02-21 ENCOUNTER — Ambulatory Visit: Payer: Self-pay

## 2024-02-21 ENCOUNTER — Other Ambulatory Visit: Payer: Self-pay

## 2024-02-21 ENCOUNTER — Ambulatory Visit: Payer: Medicare PPO

## 2024-02-21 VITALS — Ht 63.0 in | Wt 170.0 lb

## 2024-02-21 DIAGNOSIS — Z0000 Encounter for general adult medical examination without abnormal findings: Secondary | ICD-10-CM | POA: Diagnosis not present

## 2024-02-21 DIAGNOSIS — I1A0 Resistant hypertension: Secondary | ICD-10-CM

## 2024-02-21 NOTE — Telephone Encounter (Signed)
 Requesting refill of Labetalol 

## 2024-02-21 NOTE — Progress Notes (Signed)
 Chief Complaint  Patient presents with   Medicare Wellness     Subjective:   Tina  LELON Ware is a 83 y.o. female who presents for a Medicare Annual Wellness Visit.  Allergies (verified) Spironolactone    History: Past Medical History:  Diagnosis Date   Arthritis    RA   CAD (coronary artery disease)    Cataract 06/ 2020   Cataracts in June and July 2020   Clotting disorder 09/ 2020   Occasional hemorrhoidal bleeding   Encounter for gynecological examination (general) (routine) with abnormal findings 02/14/2023   Fibroid, uterine    Hypertension    Currently taking medications   Localized osteoporosis without current pathological fracture 06/10/2023   Past Surgical History:  Procedure Laterality Date   CARPAL TUNNEL RELEASE Right 12/20/2018   CATARACT EXTRACTION, BILATERAL     CHOLECYSTECTOMY     EYE SURGERY     08/2018 and [id number]   TRIGGER FINGER RELEASE Right 12/20/2018   TUBAL LIGATION  3/25 /1985   Family History  Problem Relation Age of Onset   Heart disease Mother    Dementia Mother    Congestive Heart Failure Mother    Arthritis Mother    COPD Mother    Hypertension Mother    Parkinson's disease Father    Asthma Father    Pancreatic cancer Sister    Asthma Sister    Cancer Sister    Social History   Occupational History   Not on file  Tobacco Use   Smoking status: Never    Passive exposure: Past   Smokeless tobacco: Never   Tobacco comments:    I never smoked.  Vaping Use   Vaping status: Never Used  Substance and Sexual Activity   Alcohol use: No   Drug use: No   Sexual activity: Not Currently    Birth control/protection: Post-menopausal, None   Tobacco Counseling Counseling given: Not Answered Tobacco comments: I never smoked.  SDOH Screenings   Food Insecurity: No Food Insecurity (02/17/2024)  Housing: Low Risk  (02/17/2024)  Transportation Needs: No Transportation Needs (02/17/2024)  Utilities: Not At Risk (02/21/2024)   Alcohol Screen: Low Risk  (02/07/2023)  Depression (PHQ2-9): Low Risk  (02/21/2024)  Financial Resource Strain: Low Risk  (02/17/2024)  Physical Activity: Inactive (02/17/2024)  Social Connections: Moderately Integrated (02/17/2024)  Stress: No Stress Concern Present (02/17/2024)  Tobacco Use: Low Risk  (02/21/2024)  Health Literacy: Adequate Health Literacy (02/21/2024)   See flowsheets for full screening details  Depression Screen PHQ 2 & 9 Depression Scale- Over the past 2 weeks, how often have you been bothered by any of the following problems? Little interest or pleasure in doing things: 0 Feeling down, depressed, or hopeless (PHQ Adolescent also includes...irritable): 0 PHQ-2 Total Score: 0 Trouble falling or staying asleep, or sleeping too much: 0 Feeling tired or having little energy: 0 Poor appetite or overeating (PHQ Adolescent also includes...weight loss): 0 Feeling bad about yourself - or that you are a failure or have let yourself or your family down: 0 Trouble concentrating on things, such as reading the newspaper or watching television (PHQ Adolescent also includes...like school work): 0 Moving or speaking so slowly that other people could have noticed. Or the opposite - being so fidgety or restless that you have been moving around a lot more than usual: 0 Thoughts that you would be better off dead, or of hurting yourself in some way: 0 PHQ-9 Total Score: 0 If you checked off any problems,  how difficult have these problems made it for you to do your work, take care of things at home, or get along with other people?: Not difficult at all     Goals Addressed             This Visit's Progress    COMPLETED: Patient Stated   On track    12/25/2018, wants to continue to remain mobile and stay cognitive     COMPLETED: Patient Stated       01/01/2020, losing weight, maintain BP     COMPLETED: Patient Stated       01/14/2021, continue to lose weight     COMPLETED:  Patient Stated       01/19/2022, wants to lose weight     COMPLETED: Patient Stated       02/08/2023, wants to lose weight     Remain active and independent   On track      Visit info / Clinical Intake: Medicare Wellness Visit Type:: Subsequent Annual Wellness Visit Persons participating in visit:: patient Medicare Wellness Visit Mode:: Telephone If telephone:: video declined Because this visit was a virtual/telehealth visit:: vitals recorded from last visit If Telephone or Video please confirm:: I connected with the patient using audio enabled telemedicine application and verified that I am speaking with the correct person using two identifiers; The patient expressed understanding and agreed to proceed; I discussed the limitations of evaluation and management by telemedicine Patient Location:: home Provider Location:: office Information given by:: patient Interpreter Needed?: No Pre-visit prep was completed: yes AWV questionnaire completed by patient prior to visit?: yes Date:: 02/17/24 Living arrangements:: (!) lives alone Patient's Overall Health Status Rating: very good Typical amount of pain: none Does pain affect daily life?: no Are you currently prescribed opioids?: no  Dietary Habits and Nutritional Risks How many meals a day?: 3 Eats fruit and vegetables daily?: yes Most meals are obtained by: preparing own meals Diabetic:: no  Functional Status Activities of Daily Living (to include ambulation/medication): Independent Ambulation: Independent Medication Administration: Independent Home Management: Independent Manage your own finances?: yes Primary transportation is: driving Concerns about vision?: no *vision screening is required for WTM* Concerns about hearing?: no  Fall Screening Falls in the past year?: 0 Number of falls in past year: 0 Was there an injury with Fall?: 0 Fall Risk Category Calculator: 0 Patient Fall Risk Level: Low Fall Risk  Fall  Risk Patient at Risk for Falls Due to: No Fall Risks Fall risk Follow up: Falls evaluation completed; Education provided; Falls prevention discussed  Home and Transportation Safety: All rugs have non-skid backing?: yes All stairs or steps have railings?: yes Have non-skid surface in bathtub or shower?: yes Good home lighting?: yes Regular seat belt use?: yes Hospital stays in the last year:: no  Cognitive Assessment Difficulty concentrating, remembering, or making decisions? : no Will 6CIT or Mini Cog be Completed: no 6CIT or Mini Cog Declined: patient alert, oriented, able to answer questions appropriately and recall recent events  Advance Directives (For Healthcare) Does Patient Have a Medical Advance Directive?: No Would patient like information on creating a medical advance directive?: Yes (MAU/Ambulatory/Procedural Areas - Information given)  Reviewed/Updated  Reviewed/Updated: Reviewed All (Medical, Surgical, Family, Medications, Allergies, Care Teams, Patient Goals)        Objective:    Today's Vitals   02/21/24 1108  Weight: 170 lb (77.1 kg)  Height: 5' 3 (1.6 m)   Body mass index is 30.11 kg/m.  Current Medications (  verified) Outpatient Encounter Medications as of 02/21/2024  Medication Sig   aspirin  EC 81 MG tablet Take 81 mg by mouth daily.   Cholecalciferol  (VITAMIN D) 50 MCG (2000 UT) tablet Take 2,000 Units by mouth daily.    furosemide  (LASIX ) 20 MG tablet Take 1 tablet (20 mg total) by mouth daily as needed for fluid.   hydrocortisone  (ANUSOL -HC) 2.5 % rectal cream Place 1 Application rectally 2 (two) times daily as needed for hemorrhoids or anal itching.   labetalol  (NORMODYNE ) 100 MG tablet TAKE 1 TABLET BY MOUTH TWICE A DAY   mometasone  (NASONEX ) 50 MCG/ACT nasal spray PLACE 2 SPRAYS INTO THE NOSE DAILY.   olmesartan -hydrochlorothiazide (BENICAR  HCT) 40-25 MG tablet Take 1 tablet by mouth daily.   polyethylene glycol (MIRALAX ) 17 g packet Take 17 g  by mouth daily as needed. (Patient not taking: Reported on 02/21/2024)   No facility-administered encounter medications on file as of 02/21/2024.   Hearing/Vision screen Hearing Screening - Comments:: Patient is able to hear conversational tones without difficulty. No issues reported.   Vision Screening - Comments:: Wears rx glasses - up to date with routine eye exams  Immunizations and Health Maintenance Health Maintenance  Topic Date Due   Mammogram  05/09/2024   COVID-19 Vaccine (7 - Moderna risk 2025-26 season) 07/13/2024   Medicare Annual Wellness (AWV)  02/20/2025   Bone Density Scan  05/09/2025   DTaP/Tdap/Td (2 - Td or Tdap) 07/07/2027   Pneumococcal Vaccine: 50+ Years  Completed   Influenza Vaccine  Completed   Zoster Vaccines- Shingrix  Completed   Meningococcal B Vaccine  Aged Out   Hepatitis C Screening  Discontinued        Assessment/Plan:  This is a routine wellness examination for Tina Ware .  Patient Care Team: Jarold Medici, MD as PCP - General (Internal Medicine) Ladona Heinz, MD as PCP - Cardiology (Cardiology) Cheryl Waddell HERO, PA-C as Physician Assistant (Physician Assistant)  I have personally reviewed and noted the following in the patient's chart:   Medical and social history Use of alcohol, tobacco or illicit drugs  Current medications and supplements including opioid prescriptions. Functional ability and status Nutritional status Physical activity Advanced directives List of other physicians Hospitalizations, surgeries, and ER visits in previous 12 months Vitals Screenings to include cognitive, depression, and falls Referrals and appointments  No orders of the defined types were placed in this encounter.  In addition, I have reviewed and discussed with patient certain preventive protocols, quality metrics, and best practice recommendations. A written personalized care plan for preventive services as well as general preventive health  recommendations were provided to patient.   Tina Charmaine Browner, LPN   [id number]   Return in 1 year (on 02/20/2025).  After Visit Summary: (Pick Up) Due to this being a telephonic visit, with patients personalized plan was offered to patient and patient has requested to Pick up at office.  Nurse Notes: Patient is requesting a refill of Labetalol 

## 2024-02-21 NOTE — Patient Instructions (Signed)
 Tina Ware,  Thank you for taking the time for your Medicare Wellness Visit. I appreciate your continued commitment to your health goals. Please review the care plan we discussed, and feel free to reach out if I can assist you further.  Please note that Annual Wellness Visits do not include a physical exam. Some assessments may be limited, especially if the visit was conducted virtually. If needed, we may recommend an in-person follow-up with your provider.  Ongoing Care Seeing your primary care provider every 3 to 6 months helps us  monitor your health and provide consistent, personalized care.   Referrals If a referral was made during today's visit and you haven't received any updates within two weeks, please contact the referred provider directly to check on the status.  Recommended Screenings:  Health Maintenance  Topic Date Due   Breast Cancer Screening  05/09/2024   COVID-19 Vaccine (7 - Moderna risk 2025-26 season) 07/13/2024   Medicare Annual Wellness Visit  02/20/2025   Osteoporosis screening with Bone Density Scan  05/09/2025   DTaP/Tdap/Td vaccine (2 - Td or Tdap) 07/07/2027   Pneumococcal Vaccine for age over 29  Completed   Flu Shot  Completed   Zoster (Shingles) Vaccine  Completed   Meningitis B Vaccine  Aged Out   Hepatitis C Screening  Discontinued       02/21/2024   11:09 AM  Advanced Directives  Does Patient Have a Medical Advance Directive? No  Would patient like information on creating a medical advance directive? Yes (MAU/Ambulatory/Procedural Areas - Information given)   Information on Advanced Care Planning can be found at [place]  Secretary of [facility] Advance Health Care Directives Advance Health Care Directives ([email or web address]    Vision: Annual vision screenings are recommended for early detection of glaucoma, cataracts, and diabetic retinopathy. These exams can also reveal signs of chronic conditions such as diabetes and high blood pressure.  Dental:  Annual dental screenings help detect early signs of oral cancer, gum disease, and other conditions linked to overall health, including heart disease and diabetes.  Please see the attached documents for additional preventive care recommendations.

## 2024-02-27 MED ORDER — LABETALOL HCL 100 MG PO TABS: 100.0000 mg | ORAL_TABLET | Freq: Two times a day (BID) | ORAL | 3 refills | Status: AC

## 2024-03-04 ENCOUNTER — Other Ambulatory Visit: Payer: Self-pay

## 2024-03-04 ENCOUNTER — Ambulatory Visit

## 2024-03-04 ENCOUNTER — Other Ambulatory Visit

## 2024-03-04 VITALS — BP 120/64 | HR 70 | Ht 63.0 in | Wt 170.0 lb

## 2024-03-04 DIAGNOSIS — I1A0 Resistant hypertension: Secondary | ICD-10-CM

## 2024-03-04 DIAGNOSIS — Z79899 Other long term (current) drug therapy: Secondary | ICD-10-CM

## 2024-03-04 LAB — BASIC METABOLIC PANEL WITH GFR
BUN/Creatinine Ratio: 14 (ref 12–28)
BUN: 10 mg/dL (ref 8–27)
CO2: 26 mmol/L (ref 20–29)
Calcium: 9.5 mg/dL (ref 8.7–10.3)
Chloride: 92 mmol/L — ABNORMAL LOW (ref 96–106)
Creatinine, Ser: 0.69 mg/dL (ref 0.57–1.00)
Glucose: 91 mg/dL (ref 70–99)
Potassium: 3.8 mmol/L (ref 3.5–5.2)
Sodium: 129 mmol/L — ABNORMAL LOW (ref 134–144)
eGFR: 87 mL/min/1.73 (ref >59)

## 2024-03-04 NOTE — Progress Notes (Unsigned)
 Patient presents today for bpc. Patient reports compliance with her meds. She reports she takes the benicar  1/2 tab 40-25mg  and labetalol  100mg  in the mornings and then benicar  1/2 tablet in the evenings and labetalol  in the evenings. I checked her blood pressure and it is 120/64 P70.  Patient also had both her ears flushed. YL,RMA    BP Readings from Last 3 Encounters:  02/19/24 128/80  02/07/24 (!) 144/80  02/06/24 (!) 190/85

## 2024-03-05 ENCOUNTER — Ambulatory Visit: Payer: Self-pay | Admitting: Internal Medicine

## 2024-03-05 DIAGNOSIS — E871 Hypo-osmolality and hyponatremia: Secondary | ICD-10-CM

## 2024-03-05 MED ORDER — OLMESARTAN MEDOXOMIL 40 MG PO TABS: 40.0000 mg | ORAL_TABLET | Freq: Every day | ORAL | 11 refills | Status: AC

## 2024-03-25 ENCOUNTER — Other Ambulatory Visit: Payer: Self-pay

## 2024-03-25 DIAGNOSIS — I872 Venous insufficiency (chronic) (peripheral): Secondary | ICD-10-CM

## 2024-03-26 ENCOUNTER — Ambulatory Visit: Payer: Self-pay

## 2024-03-26 DIAGNOSIS — E871 Hypo-osmolality and hyponatremia: Secondary | ICD-10-CM

## 2024-03-26 LAB — BMP8+EGFR
BUN/Creatinine Ratio: 10 — ABNORMAL LOW (ref 12–28)
BUN: 8 mg/dL (ref 8–27)
CO2: 27 mmol/L (ref 20–29)
Calcium: 9.8 mg/dL (ref 8.7–10.3)
Chloride: 100 mmol/L (ref 96–106)
Creatinine, Ser: 0.78 mg/dL (ref 0.57–1.00)
Glucose: 87 mg/dL (ref 70–99)
Potassium: 4.6 mmol/L (ref 3.5–5.2)
Sodium: 138 mmol/L (ref 134–144)
eGFR: 76 mL/min/1.73

## 2024-03-26 NOTE — Progress Notes (Signed)
 Patient presents today for a NV bpc. I checked her bp and it was 130/70 P78. Unfortunately, she did NOT stop the olmesartan /hct as per Dr. Jarold' instruction.  Most recent  instructions: olmesartan  40mg  daily (1/2 tab twice daily), labetalol  bid and furosemide  20mg  MWF. She was again given written instructions on how to take meds. Advised to rto in 3 weeks for NV. Patient was advised to bring in all her meds with her.    BP Readings from Last 3 Encounters:  03/04/24 120/64  02/19/24 128/80  02/07/24 (!) 144/80

## 2024-03-31 ENCOUNTER — Ambulatory Visit: Payer: Self-pay | Admitting: Internal Medicine

## 2024-04-12 ENCOUNTER — Ambulatory Visit (INDEPENDENT_AMBULATORY_CARE_PROVIDER_SITE_OTHER)

## 2024-04-12 VITALS — BP 150/100 | HR 85 | Temp 98.3°F | Ht 63.0 in | Wt 170.0 lb

## 2024-04-12 DIAGNOSIS — I1A0 Resistant hypertension: Secondary | ICD-10-CM

## 2024-04-12 NOTE — Progress Notes (Signed)
 Patient is in office today for a nurse visit for Blood Pressure Check. She reports compliance with medications. She reports taking Olmesartan  40MG  half in the morning the other half in the evening. Labetalol  100mg  she takes one in the morning & one in the evening. Laix she reports taking Mon, Wed & fri at night. Denies headache, chest pain & sob.  BP Readings from Last 3 Encounters:  04/12/24 (!) 150/100  03/26/24 130/70  03/04/24 120/64  Second bp taken after 10 minutes: 158/90. Per provider patient is to come back for NV in 4 weeks. Appointment scheduled.

## 2024-04-16 ENCOUNTER — Ambulatory Visit: Payer: Self-pay

## 2024-04-22 ENCOUNTER — Ambulatory Visit (HOSPITAL_COMMUNITY)

## 2024-04-28 ENCOUNTER — Other Ambulatory Visit: Payer: Self-pay | Admitting: Internal Medicine

## 2024-04-28 DIAGNOSIS — I110 Hypertensive heart disease with heart failure: Secondary | ICD-10-CM

## 2024-05-10 ENCOUNTER — Ambulatory Visit: Payer: Self-pay

## 2024-05-13 ENCOUNTER — Ambulatory Visit

## 2024-05-22 ENCOUNTER — Ambulatory Visit (HOSPITAL_COMMUNITY)

## 2024-05-23 ENCOUNTER — Encounter

## 2024-07-11 ENCOUNTER — Ambulatory Visit: Payer: Self-pay | Admitting: Internal Medicine

## 2025-02-26 ENCOUNTER — Encounter: Payer: Self-pay | Admitting: Internal Medicine
